# Patient Record
Sex: Female | Born: 1935 | Race: White | Hispanic: No | Marital: Married | Smoking: Former smoker
Health system: Southern US, Community
[De-identification: ages and names within clinical notes are randomized; demographics above are authoritative.]

## PROBLEM LIST (undated history)

## (undated) DIAGNOSIS — E7800 Pure hypercholesterolemia, unspecified: Secondary | ICD-10-CM

## (undated) DIAGNOSIS — I639 Cerebral infarction, unspecified: Secondary | ICD-10-CM

## (undated) DIAGNOSIS — I255 Ischemic cardiomyopathy: Secondary | ICD-10-CM

## (undated) DIAGNOSIS — K219 Gastro-esophageal reflux disease without esophagitis: Secondary | ICD-10-CM

## (undated) DIAGNOSIS — I2119 ST elevation (STEMI) myocardial infarction involving other coronary artery of inferior wall: Secondary | ICD-10-CM

## (undated) DIAGNOSIS — J449 Chronic obstructive pulmonary disease, unspecified: Secondary | ICD-10-CM

## (undated) DIAGNOSIS — I10 Essential (primary) hypertension: Secondary | ICD-10-CM

## (undated) DIAGNOSIS — C50919 Malignant neoplasm of unspecified site of unspecified female breast: Secondary | ICD-10-CM

## (undated) HISTORY — PX: BREAST BIOPSY: SHX20

## (undated) HISTORY — DX: Ischemic cardiomyopathy: I25.5

## (undated) HISTORY — DX: Cerebral infarction, unspecified: I63.9

## (undated) HISTORY — PX: CATARACT EXTRACTION: SUR2

## (undated) HISTORY — DX: ST elevation (STEMI) myocardial infarction involving other coronary artery of inferior wall: I21.19

## (undated) HISTORY — PX: BREAST EXCISIONAL BIOPSY: SUR124

## (undated) HISTORY — PX: BREAST MAMMOSITE: SHX5264

## (undated) HISTORY — PX: BRAIN SURGERY: SHX531

---

## 2004-07-02 ENCOUNTER — Ambulatory Visit: Payer: Self-pay | Admitting: Unknown Physician Specialty

## 2004-07-18 ENCOUNTER — Ambulatory Visit: Payer: Self-pay | Admitting: Otolaryngology

## 2004-08-22 ENCOUNTER — Ambulatory Visit: Payer: Self-pay | Admitting: Unknown Physician Specialty

## 2004-08-23 ENCOUNTER — Ambulatory Visit: Payer: Self-pay | Admitting: Unknown Physician Specialty

## 2004-10-03 ENCOUNTER — Ambulatory Visit: Payer: Self-pay

## 2004-10-21 ENCOUNTER — Ambulatory Visit: Payer: Self-pay | Admitting: Unknown Physician Specialty

## 2005-01-13 ENCOUNTER — Inpatient Hospital Stay: Payer: Self-pay | Admitting: Obstetrics and Gynecology

## 2005-02-06 ENCOUNTER — Ambulatory Visit: Payer: Self-pay | Admitting: Unknown Physician Specialty

## 2005-02-12 ENCOUNTER — Ambulatory Visit: Payer: Self-pay | Admitting: Unknown Physician Specialty

## 2005-04-04 ENCOUNTER — Ambulatory Visit: Payer: Self-pay | Admitting: Unknown Physician Specialty

## 2005-09-11 ENCOUNTER — Ambulatory Visit: Payer: Self-pay | Admitting: Unknown Physician Specialty

## 2005-11-18 ENCOUNTER — Ambulatory Visit: Payer: Self-pay | Admitting: Unknown Physician Specialty

## 2006-05-31 ENCOUNTER — Emergency Department: Payer: Self-pay | Admitting: Emergency Medicine

## 2006-06-24 ENCOUNTER — Ambulatory Visit: Payer: Self-pay | Admitting: Unknown Physician Specialty

## 2006-06-30 ENCOUNTER — Encounter: Payer: Self-pay | Admitting: Unknown Physician Specialty

## 2006-07-23 ENCOUNTER — Encounter: Payer: Self-pay | Admitting: Unknown Physician Specialty

## 2006-11-05 ENCOUNTER — Ambulatory Visit: Payer: Self-pay | Admitting: Unknown Physician Specialty

## 2006-11-17 ENCOUNTER — Ambulatory Visit: Payer: Self-pay | Admitting: Unknown Physician Specialty

## 2006-11-19 ENCOUNTER — Ambulatory Visit: Payer: Self-pay | Admitting: Unknown Physician Specialty

## 2006-11-25 ENCOUNTER — Ambulatory Visit: Payer: Self-pay | Admitting: Unknown Physician Specialty

## 2007-01-04 ENCOUNTER — Ambulatory Visit: Payer: Self-pay | Admitting: Unknown Physician Specialty

## 2007-11-25 ENCOUNTER — Ambulatory Visit: Payer: Self-pay | Admitting: Unknown Physician Specialty

## 2007-12-30 ENCOUNTER — Ambulatory Visit: Payer: Self-pay | Admitting: Unknown Physician Specialty

## 2008-01-04 ENCOUNTER — Other Ambulatory Visit: Payer: Self-pay

## 2008-01-04 ENCOUNTER — Observation Stay: Payer: Self-pay | Admitting: Specialist

## 2008-03-10 ENCOUNTER — Ambulatory Visit: Payer: Self-pay | Admitting: Unknown Physician Specialty

## 2008-08-15 ENCOUNTER — Ambulatory Visit: Payer: Self-pay | Admitting: Unknown Physician Specialty

## 2008-08-24 ENCOUNTER — Ambulatory Visit: Payer: Self-pay | Admitting: Unknown Physician Specialty

## 2008-12-05 ENCOUNTER — Encounter: Payer: Self-pay | Admitting: Unknown Physician Specialty

## 2008-12-21 ENCOUNTER — Encounter: Payer: Self-pay | Admitting: Unknown Physician Specialty

## 2009-01-20 ENCOUNTER — Encounter: Payer: Self-pay | Admitting: Unknown Physician Specialty

## 2009-02-12 ENCOUNTER — Ambulatory Visit: Payer: Self-pay | Admitting: Unknown Physician Specialty

## 2009-03-15 ENCOUNTER — Ambulatory Visit: Payer: Self-pay | Admitting: Unknown Physician Specialty

## 2009-11-08 ENCOUNTER — Ambulatory Visit: Payer: Self-pay | Admitting: Gastroenterology

## 2010-02-27 ENCOUNTER — Ambulatory Visit: Payer: Self-pay | Admitting: Unknown Physician Specialty

## 2010-03-03 ENCOUNTER — Ambulatory Visit: Payer: Self-pay | Admitting: Unknown Physician Specialty

## 2010-07-31 ENCOUNTER — Ambulatory Visit: Payer: Self-pay | Admitting: Unknown Physician Specialty

## 2010-08-14 ENCOUNTER — Encounter: Payer: Self-pay | Admitting: Unknown Physician Specialty

## 2010-08-22 ENCOUNTER — Encounter: Payer: Self-pay | Admitting: Unknown Physician Specialty

## 2010-09-22 ENCOUNTER — Encounter: Payer: Self-pay | Admitting: Unknown Physician Specialty

## 2011-02-13 ENCOUNTER — Ambulatory Visit: Payer: Self-pay | Admitting: Dermatology

## 2011-03-05 ENCOUNTER — Ambulatory Visit: Payer: Self-pay | Admitting: Unknown Physician Specialty

## 2011-07-23 ENCOUNTER — Ambulatory Visit: Payer: Self-pay | Admitting: Unknown Physician Specialty

## 2011-09-23 DIAGNOSIS — C50919 Malignant neoplasm of unspecified site of unspecified female breast: Secondary | ICD-10-CM

## 2011-09-23 HISTORY — DX: Malignant neoplasm of unspecified site of unspecified female breast: C50.919

## 2011-09-23 HISTORY — PX: BREAST LUMPECTOMY: SHX2

## 2012-03-24 ENCOUNTER — Ambulatory Visit: Payer: Self-pay | Admitting: Internal Medicine

## 2012-03-30 ENCOUNTER — Ambulatory Visit: Payer: Self-pay | Admitting: Internal Medicine

## 2012-04-06 ENCOUNTER — Ambulatory Visit: Payer: Self-pay | Admitting: Gastroenterology

## 2012-04-12 ENCOUNTER — Ambulatory Visit: Payer: Self-pay | Admitting: General Surgery

## 2012-04-19 ENCOUNTER — Ambulatory Visit: Payer: Self-pay | Admitting: General Surgery

## 2012-04-21 ENCOUNTER — Ambulatory Visit: Payer: Self-pay | Admitting: General Surgery

## 2012-04-23 LAB — PATHOLOGY REPORT

## 2012-04-30 ENCOUNTER — Ambulatory Visit: Payer: Self-pay | Admitting: Radiation Oncology

## 2012-05-03 LAB — PATHOLOGY REPORT

## 2012-05-23 ENCOUNTER — Ambulatory Visit: Payer: Self-pay | Admitting: Radiation Oncology

## 2012-06-17 ENCOUNTER — Ambulatory Visit: Payer: Self-pay | Admitting: General Surgery

## 2012-06-17 LAB — CBC WITH DIFFERENTIAL/PLATELET
Basophil %: 0.3 %
Eosinophil %: 2.7 %
HCT: 41.4 % (ref 35.0–47.0)
Lymphocyte #: 1.3 10*3/uL (ref 1.0–3.6)
MCHC: 32.9 g/dL (ref 32.0–36.0)
MCV: 93 fL (ref 80–100)
Monocyte #: 0.5 x10 3/mm (ref 0.2–0.9)
Neutrophil #: 3.3 10*3/uL (ref 1.4–6.5)
Neutrophil %: 63 %
Platelet: 358 10*3/uL (ref 150–440)
RBC: 4.45 10*6/uL (ref 3.80–5.20)
RDW: 13 % (ref 11.5–14.5)
WBC: 5.3 10*3/uL (ref 3.6–11.0)

## 2012-06-18 LAB — PATHOLOGY REPORT

## 2012-06-21 LAB — WOUND CULTURE

## 2012-07-01 ENCOUNTER — Ambulatory Visit: Payer: Self-pay | Admitting: Radiation Oncology

## 2012-07-15 ENCOUNTER — Inpatient Hospital Stay: Payer: Self-pay | Admitting: General Surgery

## 2012-07-15 LAB — CBC WITH DIFFERENTIAL/PLATELET
Basophil #: 0.1 10*3/uL (ref 0.0–0.1)
Basophil %: 0.5 %
Eosinophil %: 0.1 %
HCT: 40.9 % (ref 35.0–47.0)
HGB: 12.9 g/dL (ref 12.0–16.0)
Lymphocyte #: 0.6 10*3/uL — ABNORMAL LOW (ref 1.0–3.6)
MCV: 92 fL (ref 80–100)
Monocyte %: 7.4 %
Neutrophil #: 13.4 10*3/uL — ABNORMAL HIGH (ref 1.4–6.5)
Neutrophil %: 87.8 %
RBC: 4.46 10*6/uL (ref 3.80–5.20)
RDW: 13.3 % (ref 11.5–14.5)
WBC: 15.2 10*3/uL — ABNORMAL HIGH (ref 3.6–11.0)

## 2012-07-15 LAB — BASIC METABOLIC PANEL
Anion Gap: 7 (ref 7–16)
BUN: 11 mg/dL (ref 7–18)
Chloride: 101 mmol/L (ref 98–107)
Co2: 29 mmol/L (ref 21–32)
Creatinine: 0.76 mg/dL (ref 0.60–1.30)
Potassium: 4 mmol/L (ref 3.5–5.1)
Sodium: 137 mmol/L (ref 136–145)

## 2012-07-15 LAB — URINALYSIS, COMPLETE
Bacteria: NONE SEEN
Bilirubin,UR: NEGATIVE
Blood: NEGATIVE
Nitrite: NEGATIVE
Ph: 7 (ref 4.5–8.0)
Protein: NEGATIVE
Specific Gravity: 1.014 (ref 1.003–1.030)
Squamous Epithelial: 24

## 2012-07-16 LAB — URINE CULTURE

## 2012-07-17 LAB — CBC WITH DIFFERENTIAL/PLATELET
Basophil %: 0.7 %
HCT: 38.3 % (ref 35.0–47.0)
HGB: 12.9 g/dL (ref 12.0–16.0)
Lymphocyte #: 1.3 10*3/uL (ref 1.0–3.6)
MCH: 30.7 pg (ref 26.0–34.0)
MCHC: 33.6 g/dL (ref 32.0–36.0)
MCV: 91 fL (ref 80–100)
Monocyte #: 1 x10 3/mm — ABNORMAL HIGH (ref 0.2–0.9)
Neutrophil #: 7.7 10*3/uL — ABNORMAL HIGH (ref 1.4–6.5)
Neutrophil %: 75.1 %
RDW: 13.1 % (ref 11.5–14.5)

## 2012-07-17 LAB — WOUND AEROBIC CULTURE

## 2012-07-20 LAB — CULTURE, BLOOD (SINGLE)

## 2012-08-12 ENCOUNTER — Ambulatory Visit: Payer: Self-pay | Admitting: Radiation Oncology

## 2012-09-17 ENCOUNTER — Ambulatory Visit: Payer: Self-pay | Admitting: Radiation Oncology

## 2012-09-22 ENCOUNTER — Ambulatory Visit: Payer: Self-pay | Admitting: Radiation Oncology

## 2012-10-11 ENCOUNTER — Ambulatory Visit: Payer: Self-pay | Admitting: Gastroenterology

## 2012-11-16 ENCOUNTER — Ambulatory Visit: Payer: Self-pay | Admitting: Radiation Oncology

## 2012-11-20 ENCOUNTER — Ambulatory Visit: Payer: Self-pay | Admitting: Radiation Oncology

## 2012-11-25 ENCOUNTER — Ambulatory Visit: Payer: Self-pay | Admitting: Gastroenterology

## 2013-01-05 ENCOUNTER — Ambulatory Visit: Payer: Self-pay | Admitting: Unknown Physician Specialty

## 2013-01-05 LAB — CREATININE, SERUM
EGFR (African American): >60
EGFR (Non-African Amer.): >60

## 2013-02-17 ENCOUNTER — Ambulatory Visit: Payer: Self-pay | Admitting: Radiation Oncology

## 2013-02-20 ENCOUNTER — Ambulatory Visit: Payer: Self-pay | Admitting: Radiation Oncology

## 2013-06-07 ENCOUNTER — Ambulatory Visit: Payer: Self-pay | Admitting: Internal Medicine

## 2013-08-16 ENCOUNTER — Ambulatory Visit: Payer: Self-pay | Admitting: Radiation Oncology

## 2013-08-22 ENCOUNTER — Ambulatory Visit: Payer: Self-pay | Admitting: Radiation Oncology

## 2013-09-26 ENCOUNTER — Ambulatory Visit: Payer: Self-pay | Admitting: Gastroenterology

## 2013-09-27 LAB — PATHOLOGY REPORT

## 2014-06-20 ENCOUNTER — Ambulatory Visit: Payer: Self-pay | Admitting: Internal Medicine

## 2014-08-04 ENCOUNTER — Ambulatory Visit: Payer: Self-pay | Admitting: Radiation Oncology

## 2014-08-22 ENCOUNTER — Ambulatory Visit: Payer: Self-pay | Admitting: Radiation Oncology

## 2015-01-09 NOTE — Op Note (Signed)
PATIENT NAME:  Bianca Taylor, Bianca Taylor [facility] W MR#:  [id number] DATE OF BIRTH:  1930-01-31  DATE OF PROCEDURE:  06/17/2012  PREOPERATIVE DIAGNOSIS: Left breast infection.   POSTOPERATIVE DIAGNOSIS: Left breast infection.  OPERATIVE PROCEDURE: Debridement of MammoSite cavity, placement of drain.   SURGEON: Hervey Ard, MD   ANESTHESIA: General by LMA, Marcaine 0.5% with 1:200,000 units epinephrine 20 mL local infiltration.   ESTIMATED BLOOD LOSS: Minimal.   CLINICAL NOTE: This 81 year old woman had DCIS and was a candidate for MammoSite treatment. This was well tolerated, but one month post procedure she noticed erythema near the MammoSite balloon exit site. Culture showed evidence of Pseudomonas. This failed to improve with local debridement in the office or appropriate antibiotics by mouth. She was brought to the operating room for exam under anesthesia and further debridement.   DESCRIPTION OF PROCEDURE: The patient received a dose of Unasyn prior to surgery. The breast was prepped with Betadine solution and draped. The MammoSite exit site was sharply debrided. A hysteroscope was advanced into the 2 cm cavity of the MammoSite balloon and filamentous material without gross purulent material was noted. This was sharply debrided with a curette. Reexamination after debridement showed the area had been cleared. The red rubber catheter used for irrigation during the endoscopic examination of the cavity was transfixed with a 3-0 nylon suture and this was passed to the deep edge of the abscess cavity to the upper inner         quadrant of the breast and trimmed 1 cm outside the edge of the skin for long-standing drain. A fluff gauze followed by Kerlix followed by an Ace wrap was applied.   The patient tolerated the procedure well and was taken to the recovery room stable condition. ____________________________ Robert Bellow, MD jwb:slb D: 06/17/2012 21:39:07 ET T: 06/18/2012 10:34:39  ET JOB#: [id number]  cc: Robert Bellow, MD, <Dictator> [name], MD Armstead Peaks, MD Esterlene Atiyeh Amedeo Kinsman MD ELECTRONICALLY SIGNED 06/18/2012 10:46

## 2015-01-09 NOTE — Op Note (Signed)
PATIENT NAME:  Bianca Taylor, Bianca Taylor [facility] W MR#:  [id number] DATE OF BIRTH:  [birth year]  DATE OF PROCEDURE:  04/21/2012  PREOPERATIVE DIAGNOSIS: DCIS of the left breast.   POSTOPERATIVE DIAGNOSIS: DCIS of the left breast.   OPERATIVE PROCEDURE: Wide local excision, mastoplasty.   OPERATING SURGEON: Robert Bellow, MD   ANESTHESIA: General by LMA under Dr. Marcello Moores, Marcaine 0.5% with 1:200,000 units of epinephrine, 30 mL local infiltration.   ESTIMATED BLOOD LOSS: Less than 10 mL.   CLINICAL NOTE: This 81 year old woman was recently diagnosed with DCIS on stereotactic biopsy. Given her options for management, she desired breast conservation.   OPERATIVE NOTE: With the patient under adequate general anesthesia, the area was prepped with ChloraPrep and draped. Ultrasound was used to confirm the location of the previous biopsy cavity. This was radially based at the 1 o'clock position. This corresponded to one of her multiple breast scars from previous cyst excisions in the distant past. A radial incision was then chosen and extended from the edge of the areola at the 1 o'clock position for approximately 6 cm. The mass was below the level of the adipose tissue and the skin and subcutaneous tissue was divided sharply with hemostasis achieved by electrocautery. The adipose tissue was elevated off the area surrounding the biopsy cavity and this encompassed a mass approximately 3 x 3 x 8 cm in diameter. The specimen was orientated, specimen radiograph obtained, and delivered to pathology for routine sectioning. Of note, the fascia of the pectoralis fascia major muscle was taken with the specimen as the deep margin.   Mastoplasty was completed by elevating the breast off the underlying pectoralis fascia to the 10 o'clock position superiorly, medially and to the 5 o'clock position inferolaterally. This tissue was then approximated with interrupted 2-0 Vicryl figure-of-eight sutures. The deep layer of the adipose tissue  was approximated in a similar fashion as well as either end of the biopsy cavity. The skin was then closed with a running 4-0 Vicryl subcuticular suture. This allowed adequate placement for MammoSite balloon if indicated. Marcaine was infiltrated at the beginning and end of the procedure. Benzoin and Steri-Strips followed by a Telfa dressing, fluff gauze, Kerlix, and Ace wrap was then applied.   The patient tolerated the procedure well and was taken to the recovery room in stable condition.    ____________________________ Robert Bellow, MD jwb:drc D: 04/21/2012 17:50:41 ET T: 04/21/2012 17:55:20 ET JOB#: [id number]  cc: Robert Bellow, MD, <Dictator> [name], MD JEFFREY Amedeo Kinsman MD ELECTRONICALLY SIGNED 04/22/2012 8:49

## 2015-01-09 NOTE — Consult Note (Signed)
Reason for Visit: This 81 year old Female patient presents to the clinic for initial evaluation of  Breast cancer .   Referred by Dr. Hervey Ard.  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 She underwent needle localization which was positive for ductal carcinoma in situ. She is status post a wide local excision for 0.2 cm of ductal carcinoma in situ overall grade 2. Margins were clear at 6 mm. Tumor was ER/PR positive. She has done well postoperatively still has some slight ecchymosis surrounding the scar site. She specifically denies breast tenderness cough or bone pain. I been asked to evaluate the patient for adjuvant radiation therapy.  Past Hx:    GERD - Esophageal Reflux:    Kidney Stones:    Breast Cancer:    COPD:    Glaucoma:    Hypercholesterolemia:    HTN:    Cataract Extraction:    Hysterectomy - Partial:    Brain Surgery:   Past, Family and Social History:   Past Medical History positive    Cardiovascular hyperlipidemia; hypertension    Respiratory COPD    Gastrointestinal GERD    Genitourinary kidney stones    Neurological/Psychiatric History of benign meningioma surgically removed    Past Surgical History Hysterectomy, history of brain surgery for benign meningioma    Past Medical History Comments Glaucoma    Family History noncontributory    Social History noncontributory    Additional Past Medical and  Surgical History Seen accompanied by nurse navigator and her husband today   Allergies:   Codeine: Other  Home Meds:  Home Medications: Medication Instructions Status  Benicar 40 mg oral tablet 1 tab(s) orally once a day (in the morning) Active  pravastatin 40 mg oral tablet 1 tab(s) orally once a day (at bedtime) Active  sucralfate 1 g oral tablet 1 tab(s) orally once a day (in the morning) Active  Protonix 40 mg oral delayed release tablet 1 tab(s) orally once a day (in the morning) Active  Ambien 5 mg oral tablet 0.5 tab(s) orally once a day (at bedtime) Active  acetaminophen-butalbital-caff  325 mg-50-40  mg oral tablet 1 tab(s) orally every 4 hours, As Needed Active  probiotic colon support 1 tab(s) orally once a day Active  Senior Multivitamin oral tablet 1 tab(s) orally once a day Active  Calcium 600+D 600 mg-200 intl units oral tablet 1 tab(s) orally 2 times a day Active  Co Q-10 100 mg oral capsule 1 cap(s) orally once a day Active  CeraVe topical lotion Apply topically to affected area once a day Active  Gaviscon 80 mg-14.2 mg oral tablet, chewable 2 tab(s) orally 4 times a day (after meals and at bedtime), As Needed Active   Review of Systems:   General negative    Performance Status (ECOG) 0    Skin negative    Breast see HPI    Ophthalmologic see HPI    ENMT negative    Respiratory and Thorax negative  Cardiovascular negative    Gastrointestinal negative    Genitourinary negative    Musculoskeletal negative    Neurological negative    Psychiatric negative    Hematology/Lymphatics negative    Endocrine negative    Allergic/Immunologic negative    Review of Systems   Patient denies any weight loss, fatigue, weakness, fever, chills or night sweats. Patient denies any loss of vision, blurred vision. Patient denies any ringing  of the ears or hearing loss. No irregular heartbeat. Patient denies heart murmur or history of fainting. Patient denies any  chest pain or pain radiating to her upper extremities. Patient denies any shortness of breath, difficulty breathing at night, cough or hemoptysis. Patient denies any swelling in the lower legs. Patient denies any nausea vomiting, vomiting of blood, or coffee ground material in the vomitus. Patient denies any stomach pain. Patient states has had normal bowel movements no significant constipation or diarrhea. Patient denies any dysuria, hematuria or significant nocturia. Patient denies any problems walking, swelling in the joints or loss of balance. Patient denies any skin changes, loss of hair or loss of weight. Patient denies any excessive worrying or anxiety or significant depression. Patient denies any problems with insomnia. Patient denies excessive thirst, polyuria, polydipsia. Patient denies any swollen glands, patient denies easy bruising or easy bleeding. Patient denies any recent infections, allergies or URI. Patient "s visual fields have not changed significantly in recent time.  Nursing Notes:  Nursing Vital Signs and Chemo Nursing Nursing Notes: *CC Vital Signs Flowsheet:   09-Aug-13 10:38   Temp Temperature 97.7   Pulse Pulse 84   Respirations Respirations 20   SBP SBP 146   DBP DBP 89   Pain Scale (0-10)  1   Current Weight (kg) (kg) 59.3   Height (cm) centimeters 156   BSA (m2) 1.5   Physical Exam:  General/Skin/HEENT:   General normal    Skin normal    Eyes normal    ENMT normal    Head and Neck normal    Additional PE Well-developed well-nourished elderly female in NAD. She status post wide local excision of the left breast with some ecchymosis present surrounding the scar site. She has multiple other incisional biopsy sites of the breast which are well-healed. No dominant mass or nodularity is noted in either breast into position examined. No axillary or supraclavicular adenopathy is appreciated. Lungs are clear to A&P cardiac examination shows regular rate and rhythm.    Breasts/Resp/CV/GI/GU:   Respiratory and Thorax normal    Cardiovascular normal    Gastrointestinal normal    Genitourinary normal   MS/Neuro/Psych/Lymph:   Musculoskeletal normal    Neurological normal    Lymphatics normal   Assessment and Plan:  Impression:   ductal carcinoma in situ of the left breast teslas wide local excision ER/PR positive and 81 year old female.  Plan:   I discussed the case with Dr. Tollie Pizza personally. Based on the early stage disease her age and small tumor size believe she would be an excellent candidate if possible for accelerated partial breast irradiation. Dr. Tollie Pizza will attempt to place a dummy catheter to see if we have significant spacing from skin and chest wall. If she is able to have MammoSite catheter placed adequately we will proceed with accelerated partial breast irradiation to deliver 3400 cGy in 10 fractions at 340 cGy twice a day prescribed 1 cm from catheter surface. If catheter is unable to be placed will receive with whole breast radiation I discussed both  treatment options in detail with the patient and her husband. Risks and benefits of treatment including skin reaction, fatigue, permanent lumpectomy site thickening were all discussed in detail with the patient. We will try to coordinate with Dr. Hervey Ard office to place a catheter early next week. will also be a candidate for aromatase inhibitor most likely tamoxifen after completion of radiation therapy based on her ductal carcinoma in situ ER/PR positive status.  I would like to take this opportunity to thank you for allowing me to continue to participate in this patient's care.  CC Referral:   cc: Dr. Hervey Ard, Dr. Ramonita Lab   Electronic Signatures: Baruch Gouty Roda Shutters (MD)  (Signed 09-Aug-13 11:32)  Authored: HPI, Diagnosis, Past Hx, PFSH, Allergies, Home Meds, ROS, Nursing Notes, Physical Exam, Encounter Assessment and Plan, CC Referring Physician   Last  Updated: 09-Aug-13 11:32 by Armstead Peaks (MD)

## 2015-07-11 ENCOUNTER — Other Ambulatory Visit: Payer: Self-pay | Admitting: Internal Medicine

## 2015-07-11 DIAGNOSIS — Z853 Personal history of malignant neoplasm of breast: Secondary | ICD-10-CM

## 2015-07-18 ENCOUNTER — Other Ambulatory Visit: Payer: Self-pay | Admitting: Internal Medicine

## 2015-07-18 DIAGNOSIS — Z853 Personal history of malignant neoplasm of breast: Secondary | ICD-10-CM

## 2015-08-06 ENCOUNTER — Encounter: Payer: Self-pay | Admitting: Radiation Oncology

## 2015-08-06 ENCOUNTER — Ambulatory Visit
Admission: RE | Admit: 2015-08-06 | Discharge: 2015-08-06 | Disposition: A | Discharge status: Home / Self Care | Payer: Medicare Other | Source: Ambulatory Visit | Attending: Radiation Oncology | Admitting: Radiation Oncology

## 2015-08-06 VITALS — BP 139/88 | HR 87 | Temp 97.1°F | Resp 20 | Ht 62.0 in | Wt 130.4 lb

## 2015-08-06 DIAGNOSIS — C50912 Malignant neoplasm of unspecified site of left female breast: Secondary | ICD-10-CM

## 2015-08-06 HISTORY — DX: Pure hypercholesterolemia, unspecified: E78.00

## 2015-08-06 HISTORY — DX: Gastro-esophageal reflux disease without esophagitis: K21.9

## 2015-08-06 HISTORY — DX: Chronic obstructive pulmonary disease, unspecified: J44.9

## 2015-08-06 HISTORY — DX: Essential (primary) hypertension: I10

## 2015-08-06 HISTORY — DX: Malignant neoplasm of unspecified site of unspecified female breast: C50.919

## 2015-08-06 NOTE — Progress Notes (Signed)
Radiation Oncology Follow up Note  Name: Bianca Taylor   Date:   08/06/2015 MRN:  [id number] DOB: May 05, 1930    This 81 y.o. female presents to the clinic today for follow-up for breast cancer stage 0 ductal carcinoma in situ now out 3 years..  REFERRING PROVIDER: No ref. provider found  HPI: Patient is a pleasant 81 year old female now out 3 years having completed accelerated partial breast radiation to her left breast for ductal carcinoma in situ ER/PR positive. Seen today in routine follow-up she is doing well. Follow-up mammograms have been fine. She's currently on tamoxifen tolerating that well without side effect. She specifically denies breast tenderness cough or bone pain..  COMPLICATIONS OF TREATMENT: none  FOLLOW UP COMPLIANCE: keeps appointments   PHYSICAL EXAM:  BP 139/88 mmHg  Pulse 87  Temp(Src) 97.1 F (36.2 C)  Resp 20  Ht 5\' 2"  (1.575 m)  Wt 130 lb 6.4 oz (59.15 kg)  BMI 23.84 kg/m2 Lungs are clear to A&P cardiac examination essentially unremarkable with regular rate and rhythm. No dominant mass or nodularity is noted in either breast in 2 positions examined. Incision is well-healed. No axillary or supraclavicular adenopathy is appreciated. Cosmetic result is excellent. She has a slight area of telangiectatic changes of the skin over the old MammoSite balloon although the cosmetic result is still good to excellent. Well-developed well-nourished patient in NAD. HEENT reveals PERLA, EOMI, discs not visualized.  Oral cavity is clear. No oral mucosal lesions are identified. Neck is clear without evidence of cervical or supraclavicular adenopathy. Lungs are clear to A&P. Cardiac examination is essentially unremarkable with regular rate and rhythm without murmur rub or thrill. Abdomen is benign with no organomegaly or masses noted. Motor sensory and DTR levels are equal and symmetric in the upper and lower extremities. Cranial nerves II through XII are grossly intact.  Proprioception is intact. No peripheral adenopathy or edema is identified. No motor or sensory levels are noted. Crude visual fields are within normal range.  RADIOLOGY RESULTS: Mammograms are reviewed compatible with the above-stated findings  PLAN: At the present time she is doing well with no evidence of disease 3 years out. I'll see her back in one more year for follow-up and then discontinue follow-up care. She continues close follow-up care with surgeon. I'm please were overall progress.  I would like to take this opportunity for allowing me to participate in the care of your patient.Armstead Peaks., MD

## 2015-08-08 ENCOUNTER — Ambulatory Visit
Admission: RE | Admit: 2015-08-08 | Discharge: 2015-08-08 | Disposition: A | Discharge status: Home / Self Care | Payer: Medicare Other | Source: Ambulatory Visit | Attending: Internal Medicine | Admitting: Internal Medicine

## 2015-08-08 DIAGNOSIS — Z853 Personal history of malignant neoplasm of breast: Secondary | ICD-10-CM | POA: Diagnosis present

## 2016-07-17 ENCOUNTER — Other Ambulatory Visit: Payer: Self-pay | Admitting: Internal Medicine

## 2016-07-17 DIAGNOSIS — C50912 Malignant neoplasm of unspecified site of left female breast: Secondary | ICD-10-CM

## 2016-08-04 ENCOUNTER — Encounter: Payer: Self-pay | Admitting: Radiation Oncology

## 2016-08-04 ENCOUNTER — Ambulatory Visit
Admission: RE | Admit: 2016-08-04 | Discharge: 2016-08-04 | Disposition: A | Discharge status: Home / Self Care | Payer: Medicare Other | Source: Ambulatory Visit | Attending: Radiation Oncology | Admitting: Radiation Oncology

## 2016-08-04 VITALS — BP 150/93 | HR 98 | Temp 97.1°F | Resp 18 | Wt 128.6 lb

## 2016-08-04 DIAGNOSIS — Z79810 Long term (current) use of selective estrogen receptor modulators (SERMs): Secondary | ICD-10-CM | POA: Insufficient documentation

## 2016-08-04 DIAGNOSIS — Z170 Estrogen receptor positive status [ER+]: Secondary | ICD-10-CM | POA: Insufficient documentation

## 2016-08-04 DIAGNOSIS — D0512 Intraductal carcinoma in situ of left breast: Secondary | ICD-10-CM | POA: Insufficient documentation

## 2016-08-04 NOTE — Progress Notes (Signed)
Radiation Oncology Follow up Note  Name: Bianca Taylor   Date:   08/04/2016 MRN:  [id number] DOB: [birth year]    This 82 y.o. female presents to the clinic today for for your follow-up status post accelerated partial breast irradiation to her left breast for ER/PR positive ductal carcinoma in situ.  REFERRING PROVIDER: [name], MD  HPI: Patient is a 82 year old female now seen out 4 years having completed accelerated partial breast radiation to her left breast for ER/PR positive ductal carcinoma in situ. She seen today in routine follow-up and continues to do well. She's currently on tamoxifen tolerating that well without side effect. She has follow-up mammogram scheduled for next week prior films have been fine showing no evidence of disease.. She specifically denies breast tenderness cough or bone pain.  COMPLICATIONS OF TREATMENT: none  FOLLOW UP COMPLIANCE: keeps appointments   PHYSICAL EXAM:  BP (!) 150/93   Pulse 98   Temp 97.1 F (36.2 C)   Resp 18   Wt 128 lb 10.2 oz (58.3 kg)   BMI 23.53 kg/m  Lungs are clear to A&P cardiac examination essentially unremarkable with regular rate and rhythm. No dominant mass or nodularity is noted in either breast in 2 positions examined. Incision is well-healed. No axillary or supraclavicular adenopathy is appreciated. Cosmetic result is excellent. Well-developed well-nourished patient in NAD. HEENT reveals PERLA, EOMI, discs not visualized.  Oral cavity is clear. No oral mucosal lesions are identified. Neck is clear without evidence of cervical or supraclavicular adenopathy. Lungs are clear to A&P. Cardiac examination is essentially unremarkable with regular rate and rhythm without murmur rub or thrill. Abdomen is benign with no organomegaly or masses noted. Motor sensory and DTR levels are equal and symmetric in the upper and lower extremities. Cranial nerves II through XII are grossly intact. Proprioception is intact. No peripheral  adenopathy or edema is identified. No motor or sensory levels are noted. Crude visual fields are within normal range.  RADIOLOGY RESULTS: Previous mammograms are reviewed  PLAN: At the present time she is doing well with no evidence of disease. I'm please were overall progress. I've asked to see her back in 1 year for follow-up. She continues on tamoxifen without side effect. She knows to call sooner with any concerns.  I would like to take this opportunity to thank you for allowing me to participate in the care of your patient.Armstead Peaks., MD

## 2016-08-12 ENCOUNTER — Ambulatory Visit
Admission: RE | Admit: 2016-08-12 | Discharge: 2016-08-12 | Disposition: A | Discharge status: Home / Self Care | Payer: Medicare Other | Source: Ambulatory Visit | Attending: Internal Medicine | Admitting: Internal Medicine

## 2016-08-12 ENCOUNTER — Encounter: Payer: Self-pay | Admitting: Radiology

## 2016-08-12 DIAGNOSIS — C50912 Malignant neoplasm of unspecified site of left female breast: Secondary | ICD-10-CM | POA: Diagnosis present

## 2017-01-04 ENCOUNTER — Emergency Department
Admission: EM | Admit: 2017-01-04 | Discharge: 2017-01-04 | Disposition: A | Discharge status: Home / Self Care | Payer: Medicare Other | Attending: Emergency Medicine | Admitting: Emergency Medicine

## 2017-01-04 ENCOUNTER — Emergency Department: Payer: Medicare Other

## 2017-01-04 DIAGNOSIS — Z853 Personal history of malignant neoplasm of breast: Secondary | ICD-10-CM | POA: Insufficient documentation

## 2017-01-04 DIAGNOSIS — Y939 Activity, unspecified: Secondary | ICD-10-CM | POA: Insufficient documentation

## 2017-01-04 DIAGNOSIS — S52615A Nondisplaced fracture of left ulna styloid process, initial encounter for closed fracture: Secondary | ICD-10-CM | POA: Diagnosis not present

## 2017-01-04 DIAGNOSIS — W07XXXA Fall from chair, initial encounter: Secondary | ICD-10-CM | POA: Insufficient documentation

## 2017-01-04 DIAGNOSIS — I10 Essential (primary) hypertension: Secondary | ICD-10-CM | POA: Diagnosis not present

## 2017-01-04 DIAGNOSIS — J449 Chronic obstructive pulmonary disease, unspecified: Secondary | ICD-10-CM | POA: Insufficient documentation

## 2017-01-04 DIAGNOSIS — Y999 Unspecified external cause status: Secondary | ICD-10-CM | POA: Insufficient documentation

## 2017-01-04 DIAGNOSIS — S62328A Displaced fracture of shaft of other metacarpal bone, initial encounter for closed fracture: Secondary | ICD-10-CM

## 2017-01-04 DIAGNOSIS — Z7982 Long term (current) use of aspirin: Secondary | ICD-10-CM | POA: Diagnosis not present

## 2017-01-04 DIAGNOSIS — Y929 Unspecified place or not applicable: Secondary | ICD-10-CM | POA: Insufficient documentation

## 2017-01-04 DIAGNOSIS — Z79899 Other long term (current) drug therapy: Secondary | ICD-10-CM | POA: Insufficient documentation

## 2017-01-04 DIAGNOSIS — S6992XA Unspecified injury of left wrist, hand and finger(s), initial encounter: Secondary | ICD-10-CM | POA: Diagnosis present

## 2017-01-04 DIAGNOSIS — W19XXXA Unspecified fall, initial encounter: Secondary | ICD-10-CM

## 2017-01-04 DIAGNOSIS — S62323A Displaced fracture of shaft of third metacarpal bone, left hand, initial encounter for closed fracture: Secondary | ICD-10-CM | POA: Diagnosis not present

## 2017-01-04 DIAGNOSIS — S52325A Nondisplaced transverse fracture of shaft of left radius, initial encounter for closed fracture: Secondary | ICD-10-CM | POA: Diagnosis not present

## 2017-01-04 MED ORDER — TRAMADOL HCL 50 MG PO TABS: 50.0000 mg | ORAL_TABLET | Freq: Four times a day (QID) | ORAL | 0 refills | Status: DC | PRN

## 2017-01-04 MED ORDER — TRAMADOL HCL 50 MG PO TABS
50.0000 mg | ORAL_TABLET | Freq: Once | ORAL | Status: AC
  Administered 2017-01-04: 50 mg via ORAL
  Filled 2017-01-04: qty 1

## 2017-01-04 NOTE — ED Triage Notes (Signed)
Pt presents POV c/o left wrist pain s/p fall yesterday. Bruising noted to left wrist/hand.

## 2017-01-04 NOTE — ED Notes (Signed)
See triage note.states she fell while trying to get up out of chair  Hit her left hand on tray   Swelling and bruising noted

## 2017-01-04 NOTE — Discharge Instructions (Signed)
Ice and elevate to reduce swelling. Wear splint until seen by the orthopedist. You may wear the sling for support but did not wear it while sleeping. Tramadol as needed for pain. Be aware that this medication could cause drowsiness and increase her risk for falling. Call Dr. Ammie Ferrier office tomorrow for an appointment.

## 2017-01-04 NOTE — ED Provider Notes (Signed)
[facility] Provider Note  ____________________________________________   First MD Initiated Contact with Patient 01/04/17 1045     (approximate)  I have reviewed the triage vital signs and the nursing notes.   HISTORY  Chief Complaint Hand Pain   HPI Bianca Taylor is a 83 y.o. female is here with complaint of left hand and wrist pain. Patient states she fell yesterday while getting out of her chair. She states that she did not hit her head or lose consciousness. She states that she is aware that she was calling for her husband who was doing some yard work outside. She has continued to have pain in her left hand and wrist. Her husband is here with her today. She rates her pain as an 8 out of 10.   Past Medical History:  Diagnosis Date  . Breast cancer ([place])    left 2013  . COPD (chronic obstructive pulmonary disease) ([place])   . GERD (gastroesophageal reflux disease)   . Hypercholesterolemia   . Hypertension     There are no active problems to display for this patient.   Past Surgical History:  Procedure Laterality Date  . BRAIN SURGERY    . BREAST BIOPSY Left    positive 03/2012  . BREAST BIOPSY Bilateral    negative 2000  . BREAST EXCISIONAL BIOPSY Left    positive 04/2012  . BREAST MAMMOSITE Left   . CATARACT EXTRACTION      Prior to Admission medications   Medication Sig Start Date End Date Taking? Authorizing Provider  albuterol (PROAIR HFA) 108 (90 BASE) MCG/ACT inhaler Inhale into the lungs.    Historical Provider, MD  aspirin EC 81 MG tablet Take by mouth.    Historical Provider, MD  butalbital-acetaminophen-caffeine (FIORICET, ESGIC) [phone] MG tablet Take by mouth. 06/15/15   Historical Provider, MD  Calcium Carbonate-Vitamin D 600-400 MG-UNIT tablet Take by mouth.    Historical Provider, MD  Clobetasol Propionate (CLOBETASOL 17 PROPIONATE) POWD Use. Ointment    Historical Provider, MD  cyclobenzaprine  (FLEXERIL) 10 MG tablet ONE-HALF TO ONE TABLET TWICE DAILY 04/03/15   Historical Provider, MD  Multiple Vitamin (MULTI-VITAMINS) TABS Take by mouth.    Historical Provider, MD  pantoprazole (PROTONIX) 40 MG tablet TAKE ONE TABLET BY MOUTH EVERY DAY ONE HOUR BEFORE A MEAL 02/16/15   Historical Provider, MD  pravastatin (PRAVACHOL) 40 MG tablet TAKE 1 TABLET EVERY DAY 01/29/15   Historical Provider, MD  tamoxifen (NOLVADEX) 20 MG tablet Take by mouth. 10/16/14   Historical Provider, MD  telmisartan (MICARDIS) 80 MG tablet Take by mouth. 06/13/15 06/12/16  Historical Provider, MD  tiotropium (SPIRIVA) 18 MCG inhalation capsule Place into inhaler and inhale.    Historical Provider, MD  traMADol (ULTRAM) 50 MG tablet Take 1 tablet (50 mg total) by mouth every 6 (six) hours as needed for moderate pain. 01/04/17   [name], PA-C  zolpidem (AMBIEN) 10 MG tablet TAKE 1/2 TABLET AT BEDTIME 03/27/15   Historical Provider, MD    Allergies Codeine; Fluocinolone; Moxifloxacin; Pravastatin; Risedronate; Tetracyclines & related; and Niacin  Family History  Problem Relation Age of Onset  . Breast cancer Sister 12    Social History Social History  Substance Use Topics  . Smoking status: Never Smoker  . Smokeless tobacco: Never Used  . Alcohol use No    Review of Systems Constitutional: No fever/chills Eyes: No visual changes.  Glasses did break. ENT: No trauma Cardiovascular: Denies  chest pain. Respiratory: Denies shortness of breath. Musculoskeletal: Positive for left hand and wrist pain. Skin: Negative for rash. Neurological: Negative for headaches, focal weakness or numbness.  10-point ROS otherwise negative.  ____________________________________________   PHYSICAL EXAM:  VITAL SIGNS: ED Triage Vitals  Enc Vitals Group     BP 01/04/17 1021 140/89     Pulse Rate 01/04/17 1021 (!) 102     Resp 01/04/17 1021 14     Temp 01/04/17 1021 98 F (36.7 C)     Temp Source 01/04/17 1021 Oral       SpO2 01/04/17 1021 95 %     Weight 01/04/17 1022 125 lb (56.7 kg)     Height 01/04/17 1022 5\' 2"  (1.575 m)     Head Circumference --      Peak Flow --      Pain Score 01/04/17 1021 8     Pain Loc --      Pain Edu? --      Excl. in [place]? --     Constitutional: Alert and oriented. Well appearing and in no acute distress. Eyes: Conjunctivae are normal. PERRL. EOMI. Head: Atraumatic. Nose: No congestion/rhinnorhea. Neck: No stridor.   Cardiovascular: Normal rate, regular rhythm. Grossly normal heart sounds.  Good peripheral circulation. Respiratory: Normal respiratory effort.  No retractions. Lungs CTAB. Musculoskeletal: On examination of the hand and wrist there is no gross deformity noted however there is moderate soft tissue swelling present. There is ecchymosis present on the dorsal aspect of the left hand between the second and fourth digits. Motor sensory function intact. Capillary refill less than 3 seconds. No bleeding or open skin. Range of motion is restricted secondary to discomfort. There is some tenderness on palpation of the distal radius and on the and soft tissue swelling present. Neurologic:  Normal speech and language. No gross focal neurologic deficits are appreciated.  Skin:  Skin is warm, dry and intact. No rash noted. Psychiatric: Mood and affect are normal. Speech and behavior are normal.  ____________________________________________   LABS (all labs ordered are listed, but only abnormal results are displayed)  Labs Reviewed - No data to display  RADIOLOGY Left wrist and hand x-ray per radiologist: IMPRESSION:  1. Nondisplaced transverse fracture of the distal radius.  2. Minimally displaced ulnar styloid fracture.  3. Minimally displaced oblique fracture in the third metacarpal.  4. Extensive soft tissue swelling over the dorsum of the hand and  wrist.   I, [name], personally viewed and evaluated these images (plain radiographs) as part of my  medical decision making, as well as reviewing the written report by the radiologist.  ____________________________________________   PROCEDURES  Procedure(s) performed: None  Procedures  Critical Care performed: No  ____________________________________________   INITIAL IMPRESSION / ASSESSMENT AND PLAN / ED COURSE  Pertinent labs & imaging results that were available during my care of the patient were reviewed by me and considered in my medical decision making (see chart for details).  Patient was placed in an OCL volar splint and a sling for extra support. Patient was given a tramadol while in the emergency room. She is also discharged with prescription for the same. Patient is aware that this medication could cause drowsiness increase her risk for falling. She is to call Monday to make an appointment with Dr. Sabra Heck. Until then she is to continue ice and elevation to decrease swelling and help with pain.      ____________________________________________   FINAL CLINICAL IMPRESSION(S) /  ED DIAGNOSES  Final diagnoses:  Closed displaced fracture of shaft of third metacarpal bone, unspecified laterality, initial encounter  Closed nondisplaced fracture of styloid process of left ulna, initial encounter  Closed nondisplaced transverse fracture of shaft of left radius, initial encounter  Fall, initial encounter      NEW MEDICATIONS STARTED DURING THIS VISIT:  Discharge Medication List as of 01/04/2017 12:06 PM    START taking these medications   Details  traMADol (ULTRAM) 50 MG tablet Take 1 tablet (50 mg total) by mouth every 6 (six) hours as needed for moderate pain., Starting Sun 01/04/2017, Print         Note:  This document was prepared using Dragon voice recognition software and may include unintentional dictation errors.    [name], PA-C 01/04/17 [place], MD 01/04/17 [phone]

## 2017-02-18 ENCOUNTER — Emergency Department: Payer: Medicare Other

## 2017-02-18 ENCOUNTER — Inpatient Hospital Stay: Payer: Medicare Other

## 2017-02-18 ENCOUNTER — Inpatient Hospital Stay
Admission: EM | Admit: 2017-02-18 | Discharge: 2017-02-20 | DRG: 064 | Disposition: A | Discharge status: Home Health | Payer: Medicare Other | Attending: Internal Medicine | Admitting: Internal Medicine

## 2017-02-18 ENCOUNTER — Encounter: Payer: Self-pay | Admitting: Emergency Medicine

## 2017-02-18 DIAGNOSIS — R471 Dysarthria and anarthria: Secondary | ICD-10-CM | POA: Diagnosis present

## 2017-02-18 DIAGNOSIS — R4701 Aphasia: Secondary | ICD-10-CM | POA: Diagnosis present

## 2017-02-18 DIAGNOSIS — I639 Cerebral infarction, unspecified: Principal | ICD-10-CM | POA: Diagnosis present

## 2017-02-18 DIAGNOSIS — Z7982 Long term (current) use of aspirin: Secondary | ICD-10-CM

## 2017-02-18 DIAGNOSIS — Z66 Do not resuscitate: Secondary | ICD-10-CM | POA: Diagnosis present

## 2017-02-18 DIAGNOSIS — I10 Essential (primary) hypertension: Secondary | ICD-10-CM | POA: Diagnosis present

## 2017-02-18 DIAGNOSIS — Z803 Family history of malignant neoplasm of breast: Secondary | ICD-10-CM

## 2017-02-18 DIAGNOSIS — Z853 Personal history of malignant neoplasm of breast: Secondary | ICD-10-CM

## 2017-02-18 DIAGNOSIS — I213 ST elevation (STEMI) myocardial infarction of unspecified site: Secondary | ICD-10-CM | POA: Diagnosis not present

## 2017-02-18 DIAGNOSIS — R4781 Slurred speech: Secondary | ICD-10-CM

## 2017-02-18 DIAGNOSIS — K219 Gastro-esophageal reflux disease without esophagitis: Secondary | ICD-10-CM | POA: Diagnosis present

## 2017-02-18 DIAGNOSIS — E785 Hyperlipidemia, unspecified: Secondary | ICD-10-CM | POA: Diagnosis present

## 2017-02-18 DIAGNOSIS — R29810 Facial weakness: Secondary | ICD-10-CM | POA: Diagnosis present

## 2017-02-18 DIAGNOSIS — J449 Chronic obstructive pulmonary disease, unspecified: Secondary | ICD-10-CM | POA: Diagnosis present

## 2017-02-18 DIAGNOSIS — E7800 Pure hypercholesterolemia, unspecified: Secondary | ICD-10-CM | POA: Diagnosis present

## 2017-02-18 DIAGNOSIS — Z79899 Other long term (current) drug therapy: Secondary | ICD-10-CM | POA: Diagnosis not present

## 2017-02-18 DIAGNOSIS — I2119 ST elevation (STEMI) myocardial infarction involving other coronary artery of inferior wall: Secondary | ICD-10-CM | POA: Diagnosis present

## 2017-02-18 LAB — COMPREHENSIVE METABOLIC PANEL
ALBUMIN: 3.9 g/dL (ref 3.5–5.0)
ALK PHOS: 79 U/L (ref 38–126)
ALT: 23 U/L (ref 14–54)
AST: 87 U/L — AB (ref 15–41)
Anion gap: 7 (ref 5–15)
BILIRUBIN TOTAL: 0.6 mg/dL (ref 0.3–1.2)
BUN: 13 mg/dL (ref 6–20)
CALCIUM: 9.2 mg/dL (ref 8.9–10.3)
CO2: 28 mmol/L (ref 22–32)
Chloride: 101 mmol/L (ref 101–111)
Creatinine, Ser: 0.82 mg/dL (ref 0.44–1.00)
GFR calc Af Amer: >60 mL/min (ref >60)
GFR calc non Af Amer: >60 mL/min (ref >60)
GLUCOSE: 135 mg/dL — AB (ref 65–99)
Potassium: 3.8 mmol/L (ref 3.5–5.1)
Sodium: 136 mmol/L (ref 135–145)
TOTAL PROTEIN: 7.2 g/dL (ref 6.5–8.1)

## 2017-02-18 LAB — GLUCOSE, CAPILLARY
GLUCOSE-CAPILLARY: 105 mg/dL — AB (ref 65–99)
Glucose-Capillary: 128 mg/dL — ABNORMAL HIGH (ref 65–99)

## 2017-02-18 LAB — CBC
HCT: 44.9 % (ref 35.0–47.0)
HEMOGLOBIN: 14.7 g/dL (ref 12.0–16.0)
MCH: 29.8 pg (ref 26.0–34.0)
MCHC: 32.8 g/dL (ref 32.0–36.0)
MCV: 90.9 fL (ref 80.0–100.0)
Platelets: 421 10*3/uL (ref 150–440)
RBC: 4.94 MIL/uL (ref 3.80–5.20)
RDW: 14 % (ref 11.5–14.5)
WBC: 11.1 10*3/uL — ABNORMAL HIGH (ref 3.6–11.0)

## 2017-02-18 LAB — DIFFERENTIAL
BASOS ABS: 0 10*3/uL (ref 0–0.1)
Basophils Relative: 0 %
Eosinophils Absolute: 0 10*3/uL (ref 0–0.7)
Eosinophils Relative: 0 %
LYMPHS ABS: 1.6 10*3/uL (ref 1.0–3.6)
LYMPHS PCT: 14 %
Monocytes Absolute: 1.1 10*3/uL — ABNORMAL HIGH (ref 0.2–0.9)
Monocytes Relative: 10 %
NEUTROS ABS: 8.3 10*3/uL — AB (ref 1.4–6.5)
NEUTROS PCT: 76 %

## 2017-02-18 LAB — TROPONIN I
Troponin I: 15.07 ng/mL (ref <0.03)
Troponin I: 13.38 ng/mL (ref <0.03)

## 2017-02-18 LAB — PROTIME-INR
INR: 1.06
Prothrombin Time: 13.8 seconds (ref 11.4–15.2)

## 2017-02-18 LAB — MRSA PCR SCREENING: MRSA BY PCR: NEGATIVE

## 2017-02-18 LAB — APTT: aPTT: 30 seconds (ref 24–36)

## 2017-02-18 MED ORDER — ASPIRIN 81 MG PO CHEW
324.0000 mg | CHEWABLE_TABLET | Freq: Once | ORAL | Status: DC
  Filled 2017-02-18: qty 4

## 2017-02-18 MED ORDER — ZOLPIDEM TARTRATE 5 MG PO TABS
5.0000 mg | ORAL_TABLET | Freq: Every evening | ORAL | Status: DC | PRN
  Administered 2017-02-18 – 2017-02-19 (×2): 5 mg via ORAL
  Filled 2017-02-18 (×2): qty 1

## 2017-02-18 MED ORDER — POLYETHYLENE GLYCOL 3350 17 G PO PACK: 17.0000 g | PACK | Freq: Every day | ORAL | Status: DC | PRN

## 2017-02-18 MED ORDER — ACETAMINOPHEN 650 MG RE SUPP: 650.0000 mg | Freq: Four times a day (QID) | RECTAL | Status: DC | PRN

## 2017-02-18 MED ORDER — HEPARIN (PORCINE) IN NACL 100-0.45 UNIT/ML-% IJ SOLN
850.0000 [IU]/h | INTRAMUSCULAR | Status: DC
  Administered 2017-02-18: 650 [IU]/h via INTRAVENOUS
  Filled 2017-02-18 (×2): qty 250

## 2017-02-18 MED ORDER — BUTALBITAL-APAP-CAFFEINE 50-325-40 MG PO TABS: 1.0000 | ORAL_TABLET | ORAL | Status: DC | PRN

## 2017-02-18 MED ORDER — ASPIRIN 300 MG RE SUPP
RECTAL | Status: AC
  Filled 2017-02-18: qty 1

## 2017-02-18 MED ORDER — ACETAMINOPHEN 325 MG PO TABS
650.0000 mg | ORAL_TABLET | Freq: Four times a day (QID) | ORAL | Status: DC | PRN
  Administered 2017-02-20: 650 mg via ORAL
  Filled 2017-02-18: qty 2

## 2017-02-18 MED ORDER — ONDANSETRON HCL 4 MG/2ML IJ SOLN: 4.0000 mg | Freq: Four times a day (QID) | INTRAMUSCULAR | Status: DC | PRN

## 2017-02-18 MED ORDER — ASPIRIN EC 81 MG PO TBEC
81.0000 mg | DELAYED_RELEASE_TABLET | Freq: Every day | ORAL | Status: DC
  Administered 2017-02-19: 81 mg via ORAL
  Filled 2017-02-18: qty 1

## 2017-02-18 MED ORDER — IOPAMIDOL (ISOVUE-370) INJECTION 76%
100.0000 mL | Freq: Once | INTRAVENOUS | Status: AC | PRN
  Administered 2017-02-18: 100 mL via INTRAVENOUS

## 2017-02-18 MED ORDER — TAMOXIFEN CITRATE 10 MG PO TABS
20.0000 mg | ORAL_TABLET | Freq: Every day | ORAL | Status: DC
  Administered 2017-02-18 – 2017-02-19 (×2): 20 mg via ORAL
  Filled 2017-02-18 (×3): qty 2

## 2017-02-18 MED ORDER — ASPIRIN 300 MG RE SUPP
300.0000 mg | Freq: Once | RECTAL | Status: AC
  Administered 2017-02-18: 300 mg via RECTAL

## 2017-02-18 MED ORDER — HEPARIN BOLUS VIA INFUSION
3000.0000 [IU] | Freq: Once | INTRAVENOUS | Status: AC
  Administered 2017-02-18: 3000 [IU] via INTRAVENOUS
  Filled 2017-02-18: qty 3000

## 2017-02-18 MED ORDER — SODIUM CHLORIDE 0.9% FLUSH
3.0000 mL | Freq: Two times a day (BID) | INTRAVENOUS | Status: DC
  Administered 2017-02-18 – 2017-02-20 (×4): 3 mL via INTRAVENOUS

## 2017-02-18 MED ORDER — ALBUTEROL SULFATE (2.5 MG/3ML) 0.083% IN NEBU: 2.5000 mg | INHALATION_SOLUTION | RESPIRATORY_TRACT | Status: DC | PRN

## 2017-02-18 MED ORDER — ONDANSETRON HCL 4 MG PO TABS: 4.0000 mg | ORAL_TABLET | Freq: Four times a day (QID) | ORAL | Status: DC | PRN

## 2017-02-18 MED ORDER — PRAVASTATIN SODIUM 20 MG PO TABS
40.0000 mg | ORAL_TABLET | Freq: Every day | ORAL | Status: DC
  Administered 2017-02-18: 40 mg via ORAL
  Filled 2017-02-18: qty 2

## 2017-02-18 MED ORDER — CYCLOBENZAPRINE HCL 10 MG PO TABS: 5.0000 mg | ORAL_TABLET | Freq: Two times a day (BID) | ORAL | Status: DC | PRN

## 2017-02-18 MED ORDER — TRAMADOL HCL 50 MG PO TABS: 50.0000 mg | ORAL_TABLET | Freq: Four times a day (QID) | ORAL | Status: DC | PRN

## 2017-02-18 NOTE — ED Notes (Signed)
Pt denies CP, SOB, n/v/d or dizziness. Pt ss that she feels embarassed

## 2017-02-18 NOTE — ED Notes (Signed)
Per Dr. Quentin Cornwall called [facility] to [facility] [phone]

## 2017-02-18 NOTE — H&P (Signed)
[place] [facility] [facility] [place] NAME: Marget Outten    MR#:  [id number]  DATE OF BIRTH:  [birth year]  DATE OF ADMISSION:  02/18/2017  PRIMARY CARE PHYSICIAN: [name], MD   REQUESTING/REFERRING PHYSICIAN: Dr. Quentin Cornwall  CHIEF COMPLAINT:   Chief Complaint  Patient presents with  . Aphasia    HISTORY OF PRESENT ILLNESS:  Ardella Chhim  is a 83 y.o. female with a known history of Hypertension, breast cancer presents to the emergency room with dysarthria since waking up today morning. Husband also noticed left facial droop. Last known well time is 10:30 PM. No other focal weakness or numbness. A code stroke was called. CT scan showed no acute stroke. An EKG was obtained which showed inferior wall ST elevation MI. Patient has been evaluated by cardiology at this time. She did have some vomiting and nausea and chest pain 2 days back with is thought to be be even bleeding to the MI. Presently patient is being admitted to ICU for medical management and neurology evaluation. Will need cardiac catheterization as per cardiology recommendations. Patient has no chest pain or shortness of breath at this time. Only complaint is dysarthria. No trouble swallowing.  PAST MEDICAL HISTORY:   Past Medical History:  Diagnosis Date  . Breast cancer ([place])    left 2013  . COPD (chronic obstructive pulmonary disease) ([place])   . GERD (gastroesophageal reflux disease)   . Hypercholesterolemia   . Hypertension     PAST SURGICAL HISTORY:   Past Surgical History:  Procedure Laterality Date  . BRAIN SURGERY    . BREAST BIOPSY Left    positive 03/2012  . BREAST BIOPSY Bilateral    negative 2000  . BREAST EXCISIONAL BIOPSY Left    positive 04/2012  . BREAST MAMMOSITE Left   . CATARACT EXTRACTION      SOCIAL HISTORY:   Social History  Substance Use Topics  . Smoking status: Never Smoker  . Smokeless tobacco: Never Used  . Alcohol use No    FAMILY HISTORY:    Family History  Problem Relation Age of Onset  . Breast cancer Sister 108    DRUG ALLERGIES:   Allergies  Allergen Reactions  . Codeine     Other reaction(s): Dizziness  . Fluocinolone Nausea And Vomiting  . Moxifloxacin     Other reaction(s): Vomiting  . Pravastatin     Other reaction(s): Muscle Pain  . Risedronate Other (See Comments)    Bone pain  . Tetracyclines & Related Nausea And Vomiting  . Niacin Rash    REVIEW OF SYSTEMS:   Review of Systems  Constitutional: Positive for malaise/fatigue. Negative for chills and fever.  HENT: Negative for sore throat.   Eyes: Negative for blurred vision, double vision and pain.  Respiratory: Negative for cough, hemoptysis, shortness of breath and wheezing.   Cardiovascular: Negative for chest pain, palpitations, orthopnea and leg swelling.  Gastrointestinal: Negative for abdominal pain, constipation, diarrhea, heartburn, nausea and vomiting.  Genitourinary: Negative for dysuria and hematuria.  Musculoskeletal: Negative for back pain and joint pain.  Skin: Negative for rash.  Neurological: Positive for speech change. Negative for sensory change, focal weakness and headaches.  Endo/Heme/Allergies: Does not bruise/bleed easily.  Psychiatric/Behavioral: Negative for depression. The patient is not nervous/anxious.     MEDICATIONS AT HOME:   Prior to Admission medications   Medication Sig Start Date End Date Taking? Authorizing Provider  aspirin EC 81 MG tablet  Take 81 mg by mouth at bedtime.    Yes [provider]  butalbital-acetaminophen-caffeine (FIORICET, ESGIC) 50-325-40 MG tablet Take 1 tablet by mouth every 4 (four) hours as needed for headache.    Yes [provider]  cyclobenzaprine (FLEXERIL) 10 MG tablet Take 5-10 mg by mouth 2 (two) times daily as needed for muscle spasms.   Yes [provider]  pravastatin (PRAVACHOL) 40 MG tablet Take 40 mg by mouth at bedtime.   Yes [provider]  tamoxifen (NOLVADEX) 20 MG tablet Take 20 mg by mouth at bedtime.    Yes [provider]  telmisartan (MICARDIS) 80 MG tablet Take 80 mg by mouth at bedtime.   Yes [provider]  traMADol (ULTRAM) 50 MG tablet Take 1 tablet (50 mg total) by mouth every 6 (six) hours as needed for moderate pain. 01/04/17  Yes Letitia Neri L, PA-C  zolpidem (AMBIEN) 5 MG tablet Take 5 mg by mouth at bedtime as needed for sleep.   Yes [provider]     VITAL SIGNS:  Blood pressure 127/86, pulse 82, temperature 97.7 F (36.5 C), temperature source Oral, resp. rate (!) 25, height 5\' 1"  (1.549 m), weight 56.7 kg (125 lb), SpO2 96 %.  PHYSICAL EXAMINATION:  Physical Exam  GENERAL:  83 y.o.-year-old patient lying in the bed with no acute distress.  EYES: Pupils equal, round, reactive to light and accommodation. No scleral icterus. Extraocular muscles intact.  HEENT: Head atraumatic, normocephalic. Oropharynx and nasopharynx clear. No oropharyngeal erythema, moist oral mucosa  NECK:  Supple, no jugular venous distention. No thyroid enlargement, no tenderness.  LUNGS: Normal breath sounds bilaterally, no wheezing, rales, rhonchi. No use of accessory muscles of respiration.  CARDIOVASCULAR: S1, S2 normal. No murmurs, rubs, or gallops.  ABDOMEN: Soft, nontender, nondistended. Bowel sounds present. No organomegaly or mass.  EXTREMITIES: No pedal edema, cyanosis, or clubbing. + 2 pedal & radial pulses b/l.   NEUROLOGIC: Cranial nerves II through XII are intact, with left facial droop. No focal Motor or sensory deficits appreciated b/l. dysarthria PSYCHIATRIC: The patient is alert and oriented x 3. Good affect.  SKIN: No obvious rash, lesion, or ulcer.   LABORATORY PANEL:   CBC  Recent Labs Lab 02/18/17 1359  WBC 11.1*  HGB 14.7  HCT 44.9  PLT 421    ------------------------------------------------------------------------------------------------------------------  Chemistries   Recent Labs Lab 02/18/17 1359  NA 136  K 3.8  CL 101  CO2 28  GLUCOSE 135*  BUN 13  CREATININE 0.82  CALCIUM 9.2  AST 87*  ALT 23  ALKPHOS 79  BILITOT 0.6   ------------------------------------------------------------------------------------------------------------------  Cardiac Enzymes  Recent Labs Lab 02/18/17 1359  TROPONINI 15.07*   ------------------------------------------------------------------------------------------------------------------  RADIOLOGY:  Ct Angio Head W Or Wo Contrast  Result Date: 02/18/2017 CLINICAL DATA:  The patient awoke at 9:30 earlier today with Slurred speech and RIGHT facial droop. History of breast cancer. EXAM: CT ANGIOGRAPHY HEAD AND NECK TECHNIQUE: Multidetector CT imaging of the head and neck was performed using the standard protocol during bolus administration of intravenous contrast. Multiplanar CT image reconstructions and MIPs were obtained to evaluate the vascular anatomy. Carotid stenosis measurements (when applicable) are obtained utilizing NASCET criteria, using the distal internal carotid diameter as the denominator. CONTRAST:  Isovue 370, 100 mL. COMPARISON:  CT chest reported separately. MR head 01/05/2013. FINDINGS: CT HEAD Calvarium and skull base: No fracture or destructive lesion. Mastoids and middle ears are grossly clear. LEFT frontal craniotomy appears  uncomplicated. Paranasal sinuses: Imaged portions are clear. Orbits: Negative. Brain: No evidence of acute abnormality, including acute infarct, hemorrhage, hydrocephalus, or mass lesion. LEFT frontal encephalomalacia, previous brain surgery, for unknown indication. Moderate atrophy, not unexpected for age. Extensive white matter disease. CTA NECK Aortic arch: Standard branching. Imaged portion shows no evidence of aneurysm or dissection. No  significant stenosis of the major arch vessel origins. Right carotid system: No significant plaque at the bifurcation. No evidence of dissection, stenosis (50% or greater) or occlusion. Left carotid system: No significant plaque at the bifurcation. No evidence of dissection, stenosis (50% or greater) or occlusion. Vertebral arteries: Codominant. No evidence of dissection, stenosis (50% or greater) or occlusion. Nonvascular soft tissues: No lung apex lesion or pneumothorax. No neck mass. Cervical spondylosis. Airway midline. CTA HEAD Anterior circulation: Dolichoectatic skull base internal carotid arteries with minimal calcification. Supraclinoid ICA segments widely patent. There is no proximal stenosis of the anterior, or middle cerebral arteries. Distal MCA branches widely patent. No significant stenosis, proximal occlusion, aneurysm, or vascular malformation. Posterior circulation: Codominant distal vertebral arteries. Dolichoectatic and widely patent basilar artery. Proximal posterior cerebral arteries widely patent. No significant stenosis, proximal occlusion, aneurysm, or vascular malformation. Venous sinuses: As permitted by contrast timing, patent. Anatomic variants: None of significance. Delayed phase:   No abnormal intracranial enhancement. Review of the MIP images confirms the above findings IMPRESSION: No intracranial or extracranial stenosis or dissection. No evidence for acute vascular occlusion. Atrophy and small vessel disease. Sequelae of remote LEFT frontal craniotomy and LEFT frontal encephalomalacia. No intracranial metastatic disease is evident. Electronically Signed   By: Staci Righter M.D.   On: 02/18/2017 15:08   Ct Angio Neck W And/or Wo Contrast  Result Date: 02/18/2017 CLINICAL DATA:  The patient awoke at 9:30 earlier today with Slurred speech and RIGHT facial droop. History of breast cancer. EXAM: CT ANGIOGRAPHY HEAD AND NECK TECHNIQUE: Multidetector CT imaging of the head and neck was  performed using the standard protocol during bolus administration of intravenous contrast. Multiplanar CT image reconstructions and MIPs were obtained to evaluate the vascular anatomy. Carotid stenosis measurements (when applicable) are obtained utilizing NASCET criteria, using the distal internal carotid diameter as the denominator. CONTRAST:  Isovue 370, 100 mL. COMPARISON:  CT chest reported separately. MR head 01/05/2013. FINDINGS: CT HEAD Calvarium and skull base: No fracture or destructive lesion. Mastoids and middle ears are grossly clear. LEFT frontal craniotomy appears uncomplicated. Paranasal sinuses: Imaged portions are clear. Orbits: Negative. Brain: No evidence of acute abnormality, including acute infarct, hemorrhage, hydrocephalus, or mass lesion. LEFT frontal encephalomalacia, previous brain surgery, for unknown indication. Moderate atrophy, not unexpected for age. Extensive white matter disease. CTA NECK Aortic arch: Standard branching. Imaged portion shows no evidence of aneurysm or dissection. No significant stenosis of the major arch vessel origins. Right carotid system: No significant plaque at the bifurcation. No evidence of dissection, stenosis (50% or greater) or occlusion. Left carotid system: No significant plaque at the bifurcation. No evidence of dissection, stenosis (50% or greater) or occlusion. Vertebral arteries: Codominant. No evidence of dissection, stenosis (50% or greater) or occlusion. Nonvascular soft tissues: No lung apex lesion or pneumothorax. No neck mass. Cervical spondylosis. Airway midline. CTA HEAD Anterior circulation: Dolichoectatic skull base internal carotid arteries with minimal calcification. Supraclinoid ICA segments widely patent. There is no proximal stenosis of the anterior, or middle cerebral arteries. Distal MCA branches widely patent. No significant stenosis, proximal occlusion, aneurysm, or vascular malformation. Posterior circulation: Codominant distal  vertebral arteries.  Dolichoectatic and widely patent basilar artery. Proximal posterior cerebral arteries widely patent. No significant stenosis, proximal occlusion, aneurysm, or vascular malformation. Venous sinuses: As permitted by contrast timing, patent. Anatomic variants: None of significance. Delayed phase:   No abnormal intracranial enhancement. Review of the MIP images confirms the above findings IMPRESSION: No intracranial or extracranial stenosis or dissection. No evidence for acute vascular occlusion. Atrophy and small vessel disease. Sequelae of remote LEFT frontal craniotomy and LEFT frontal encephalomalacia. No intracranial metastatic disease is evident. Electronically Signed   By: Staci Righter M.D.   On: 02/18/2017 15:08   Ct Angio Chest Aorta W And/or Wo Contrast  Result Date: 02/18/2017 CLINICAL DATA:  Slurred speech, RIGHT facial droop, concern for dissection of the aorta. EXAM: CT ANGIOGRAPHY CHEST WITH CONTRAST TECHNIQUE: Multidetector CT imaging of the chest was performed using the standard protocol during bolus administration of intravenous contrast. Multiplanar CT image reconstructions and MIPs were obtained to evaluate the vascular anatomy. CONTRAST:  Isovue 370, 100 mL. COMPARISON:  CT simulation chest from 05/06/2012. CTA head neck reported separately. FINDINGS: Cardiovascular: Preferential opacification of the thoracic aorta. No evidence of thoracic aortic aneurysm or dissection. Mild calcified atheromatous change along the transverse arch. Normal heart size. No pericardial effusion. Posterior wall plaque decreased in a descending aorta without significant luminal narrowing. Mediastinum/Nodes: No enlarged mediastinal, hilar, or axillary lymph nodes. Thyroid gland, trachea, and esophagus demonstrate no significant findings. Lungs/Pleura: There is a vague area of architectural distortion, posterior peripheral medial RIGHT lower lobe, image 37 series 5 which could be identified on the  previous CT Sim exam and is stable. No pulmonary nodules seen. There is no significant pleural effusion. Upper Abdomen: No acute abnormality. Musculoskeletal: No chest wall abnormality. No acute or significant osseous findings. Postsurgical changes, LEFT breast. Review of the MIP images confirms the above findings. IMPRESSION: No evidence for aortic dissection or other acute  chest pathology. Electronically Signed   By: Staci Righter M.D.   On: 02/18/2017 14:51     IMPRESSION AND PLAN:   * ST elevation MI, inferior Admit to ICU. Cardiology has evaluated the patient. At this time she will be on aspirin, statin, heparin drip. Telemetry monitoring. Echocardiogram. Cardiac catheterization as per patient's progress. Acute event likely occurred 2 days back.  * Acute CVA with expressive aphasia -Check MRI of the brain, Carotid dopplers, Echo - Start aspirin and statin. - Lovenox for DVT prophylaxis. - PT/OT/Speech consult as needed per symptoms - Neuro checks every 4 hours for 24 hours. - Consult neurology. - Hold blood pressure medications for permissive hypertension  * Hypertension. Hold medications.  * DVT prophylaxis. On heparin drip.  All the records are reviewed and case discussed with ED provider. Management plans discussed with the patient, family and they are in agreement.  CODE STATUS: DNR  TOTAL CC TIME TAKING CARE OF THIS PATIENT: 40 minutes.   Hillary Bow R M.D on 02/18/2017 at 4:17 PM  Between 7am to 6pm - Pager - [phone]  After 6pm go to [email or web address] - password [email or web address] [facility]  [employer]  Office  [phone]  CC: Primary care physician; [name], MD  Note: This dictation was prepared with Dragon dictation along with smaller phrase technology. Any transcriptional errors that result from this process are unintentional.

## 2017-02-18 NOTE — ED Notes (Signed)
MRI in room to take pt to scan.  This RN stopped tech, stating that pt was waiting for [facility] consult that required pt to be in room. This RN confirmed w/ MD Quentin Cornwall that he wanted [facility] consult before MRI

## 2017-02-18 NOTE — Consult Note (Signed)
Cardiology Consultation:   [name]; [id number]; Sep 02, 1930   Admit date: 02/18/2017 Date of Consult: 02/18/2017  Primary Care Provider: [name], MD Primary Cardiologist: New Primary Electrophysiologist:  None   Patient Profile:   Bianca Taylor Bianca Taylor is a 83 y.o. female with a hx of Hypertension, hyperlipidemia, breast cancer, COPD, GERD, and meningioma who is being seen today for the evaluation of abnormal EKG at the request of Merlyn Lot, M.D.  History of Present Illness:   Bianca Taylor presented to the ED today after her husband noted a facial droop and slurred speech. The patient reports that she was feeling relatively well last night. However, shortly after getting up this morning and having breakfast (approximately 9:30 AM), she had difficulty speaking. She did not have any pain. She denies other focal weakness. Her husband notes a facial droop several years ago, which was attributed to an eye problem. Upon arrival in the ED, EKG was obtained demonstrating inferior ST segment elevation.  Upon further questioning, Ms. Chesney reports an episode of severe substernal chest pain radiating to the upper abdomen with accompanying nausea and shortness of breath 2 nights ago. The pain lasted several hours, with multiple episodes of vomiting and dry heaving. The pain ultimately resolved spontaneously and did not recur yesterday or this morning. She has not had a history of prior heart problems. She is without chest pain and shortness of breath at this time. She also denies orthopnea, PND, and edema.  Past Medical History:  Diagnosis Date  . Breast cancer ([place])    left 2013  . COPD (chronic obstructive pulmonary disease) ([place])   . GERD (gastroesophageal reflux disease)   . Hypercholesterolemia   . Hypertension     Past Surgical History:  Procedure Laterality Date  . BRAIN SURGERY    . BREAST BIOPSY Left    positive 03/2012  . BREAST BIOPSY  Bilateral    negative 2000  . BREAST EXCISIONAL BIOPSY Left    positive 04/2012  . BREAST MAMMOSITE Left   . CATARACT EXTRACTION       Home Medications: No current facility-administered medications on file prior to encounter.    Current Outpatient Prescriptions on File Prior to Encounter  Medication Sig Dispense Refill  . aspirin EC 81 MG tablet Take 81 mg by mouth at bedtime.     . butalbital-acetaminophen-caffeine (FIORICET, ESGIC) 50-325-40 MG tablet Take 1 tablet by mouth every 4 (four) hours as needed for headache.     . tamoxifen (NOLVADEX) 20 MG tablet Take 20 mg by mouth at bedtime.     . traMADol (ULTRAM) 50 MG tablet Take 1 tablet (50 mg total) by mouth every 6 (six) hours as needed for moderate pain. 20 tablet 0    Allergies:    Allergies  Allergen Reactions  . Codeine     Other reaction(s): Dizziness  . Fluocinolone Nausea And Vomiting  . Moxifloxacin     Other reaction(s): Vomiting  . Pravastatin     Other reaction(s): Muscle Pain  . Risedronate Other (See Comments)    Bone pain  . Tetracyclines & Related Nausea And Vomiting  . Niacin Rash    Social History:   Social History   Social History  . Marital status: Married    Spouse name: N/A  . Number of children: N/A  . Years of education: N/A   Occupational History  . Not on file.   Social History Main Topics  .  Smoking status: Never Smoker  . Smokeless tobacco: Never Used  . Alcohol use No  . Drug use: Unknown  . Sexual activity: Not on file   Other Topics Concern  . Not on file   Social History Narrative  . No narrative on file    Family History:   The patient's family history includes Breast cancer (age of onset: 64) in her sister.  ROS:  The patient has continued left hand pain after a fall and fracture. Otherwise, a 12 system review of systems was performed and is negative except as noted in the history of present illness.  Physical Exam/Data:   Vitals:   02/18/17 1350 02/18/17 1401  02/18/17 1500  BP: 111/72 123/80 114/72  Pulse: (!) 102 98 80  Resp: 16  (!) 26  Temp: 97.7 F (36.5 C)    TempSrc: Oral    SpO2: 97% 96% 98%  Weight: 125 lb (56.7 kg)    Height: 5\' 1"  (1.549 m)     No intake or output data in the 24 hours ending 02/18/17 1505 Filed Weights   02/18/17 1350  Weight: 125 lb (56.7 kg)   Body mass index is 23.62 kg/m.  General:  Thin, elderly woman, lying on the emergency department gurney. Her husband, daughter-in-law, and son are at the bedside. HEENT: Normocephalic atraumatic. No conjunctival pallor or scleral icterus. Dry mucous membranes. Lymph: no adenopathy Neck: no JVD Endocrine:  No thryomegaly Vascular: No carotid bruits; 2+ radial, 1+ pedal pulses bilaterally. Cardiac:  Regular rate and rhythm without murmurs, rubs, or gallops. Lungs:  Normal work of breathing. Clear breath sounds anteriorly without wheezes or crackles. Abd: Bowel sounds present. Soft, nontender, nondistended without HSM. Ext: No lower extremity edema. Musculoskeletal:  No deformities. Skin: warm and dry  Neuro:  Facial droop noted with decreased movement of the right lip. Slight rightward deviation of the tongue. 5/5 right upper and bilateral lower extremity strength. 4/5 left upper external strength, though exam is somewhat limited by pain related to recent left hand fracture. Psych: Blunted affect.  EKG:  The EKG was personally reviewed and demonstrates sinus tachycardia with PAC. There are ST segment elevations in the inferior leads with ST depressions in V1 and V2. Inferolateral T-wave inversions are also present.  Relevant CV Studies: None  Laboratory Data:  Chemistry Recent Labs Lab 02/18/17 1359  NA 136  K 3.8  CL 101  CO2 28  GLUCOSE 135*  BUN 13  CREATININE 0.82  CALCIUM 9.2  GFRNONAA >60  GFRAA >60  ANIONGAP 7     Recent Labs Lab 02/18/17 1359  PROT 7.2  ALBUMIN 3.9  AST 87*  ALT 23  ALKPHOS 79  BILITOT 0.6   Hematology Recent  Labs Lab 02/18/17 1359  WBC 11.1*  RBC 4.94  HGB 14.7  HCT 44.9  MCV 90.9  MCH 29.8  MCHC 32.8  RDW 14.0  PLT 421   Cardiac Enzymes Recent Labs Lab 02/18/17 1359  TROPONINI 15.07*   No results for input(s): TROPIPOC in the last 168 hours.  BNPNo results for input(s): BNP, PROBNP in the last 168 hours.  DDimer No results for input(s): DDIMER in the last 168 hours.  Radiology/Studies:  Ct Angio Chest Aorta W And/or Wo Contrast  Result Date: 02/18/2017 CLINICAL DATA:  Slurred speech, RIGHT facial droop, concern for dissection of the aorta. EXAM: CT ANGIOGRAPHY CHEST WITH CONTRAST TECHNIQUE: Multidetector CT imaging of the chest was performed using the standard protocol during bolus administration of  intravenous contrast. Multiplanar CT image reconstructions and MIPs were obtained to evaluate the vascular anatomy. CONTRAST:  Isovue 370, 100 mL. COMPARISON:  CT simulation chest from 05/06/2012. CTA head neck reported separately. FINDINGS: Cardiovascular: Preferential opacification of the thoracic aorta. No evidence of thoracic aortic aneurysm or dissection. Mild calcified atheromatous change along the transverse arch. Normal heart size. No pericardial effusion. Posterior wall plaque decreased in a descending aorta without significant luminal narrowing. Mediastinum/Nodes: No enlarged mediastinal, hilar, or axillary lymph nodes. Thyroid gland, trachea, and esophagus demonstrate no significant findings. Lungs/Pleura: There is a vague area of architectural distortion, posterior peripheral medial RIGHT lower lobe, image 37 series 5 which could be identified on the previous CT Sim exam and is stable. No pulmonary nodules seen. There is no significant pleural effusion. Upper Abdomen: No acute abnormality. Musculoskeletal: No chest wall abnormality. No acute or significant osseous findings. Postsurgical changes, LEFT breast. Review of the MIP images confirms the above findings. IMPRESSION: No evidence  for aortic dissection or other acute  chest pathology. Electronically Signed   By: Staci Righter M.D.   On: 02/18/2017 14:51    Assessment and Plan:   1. STEMI EKG is consistent with STEMI, most likely due to acute plaque rupture with thrombus formation (type I MI). Presentation is complicated by lack of ongoing chest pain as well as acute neurologic changes concerning for stroke. Given that patient's chest pain with accompanying shortness of breath and nausea occurred 36-48 hours ago and initial troponin is 15, I favor this being a late presenting STEMI. I have spoken with the patient and her family at length regarding this finding and medical management versus emergent catheterization. We have agreed to pursue medical therapy, given time course and neurologic abnormalities.  Admit to ICU for medical management of inferior/posterior STEMI.  Continue aspirin. If felt safe to do so from a neurologic standpoint, would recommend IV heparin 48 hours. I am hesitant to add clopidogrel at this time, given acute neurologic event and history of falls.   Add high intensity statin therapy.  Obtain transthoracic echocardiogram.  Check lipid panel, hemoglobin A1c, and TSH.  Trend troponins until they have peaked, then stop.  Consider addition of low-dose beta blocker. We will defer this for the time being pending neurology evaluation to facilitate permissive hypertension in the setting of acute stroke.  2. Stroke Patient's chief complaint and neurologic exam are concerning for acute stroke. Symptoms seem to have started within the last 12-24 hours. No CT evidence of acute intracranial pathology.  Recommend urgent neurologic evaluation.  Aggressive secondary prevention.  3. Hypertension Blood pressure is normal. Recommend holding home antihypertensive regimen to allow for permissive hypertension pending neurologic evaluation.  Consider addition of low-dose beta blocker as heart rate and blood  pressure allow prior to discharge.  4. Hyperlipidemia Patient is on pravastatin at home. She would benefit from more aggressive lipid therapy.  Check fasting lipid panel.  I intensity statin therapy.  Signed, Nelva Bush, MD  02/18/2017 3:05 PM

## 2017-02-18 NOTE — ED Notes (Signed)
Critical troponin relayed to MD Quentin Cornwall, no new orders at this time

## 2017-02-18 NOTE — ED Triage Notes (Signed)
Arrives with slurred speech and right facial droop.  Husband states they went to bed last night at 2230, patient was free of symptoms.  Awoke this morning at 0930 and patient's speech was slurred and right facial droop.  MAE equally and strong.

## 2017-02-18 NOTE — ED Notes (Signed)
Patient transported to MRI 

## 2017-02-18 NOTE — ED Notes (Signed)
Called [facility] for neuro consult per Dr. Quentin Cornwall [phone]

## 2017-02-18 NOTE — ED Notes (Signed)
Pharmacy called for heparin, pharmacy tech sts that heparin had already been sent. This RN stated that she had not received heparin and did not find it around med station. Pharm tech stated that he would resend

## 2017-02-18 NOTE — ED Provider Notes (Signed)
[facility] Emergency Department Provider Note    None    (approximate)  I have reviewed the triage vital signs and the nursing notes.   HISTORY  Chief Complaint Aphasia    HPI Bianca Taylor is a 83 y.o. female history hypertension and elevated cholesterol as well as COPD presents with slurred speech and right facial droop. Last seen normal was last night at around 10:30. This morning patient woke up and the husband noted the symptoms as described previously. She denies any weakness. Denies any chest pain or shortness of breath. States that she's having difficulty getting her words out. Denies any history of MI. In triage EKG was ordered due to the above complaints that shows evidence of inferolateral ST elevation MI though the patient denies any chest pain as described above.   Past Medical History:  Diagnosis Date  . Breast cancer ([place])    left 2013  . COPD (chronic obstructive pulmonary disease) ([place])   . GERD (gastroesophageal reflux disease)   . Hypercholesterolemia   . Hypertension    Family History  Problem Relation Age of Onset  . Breast cancer Sister 36   Past Surgical History:  Procedure Laterality Date  . BRAIN SURGERY    . BREAST BIOPSY Left    positive 03/2012  . BREAST BIOPSY Bilateral    negative 2000  . BREAST EXCISIONAL BIOPSY Left    positive 04/2012  . BREAST MAMMOSITE Left   . CATARACT EXTRACTION     Patient Active Problem List   Diagnosis Date Noted  . STEMI (ST elevation myocardial infarction) ([place]) 02/18/2017      Prior to Admission medications   Medication Sig Start Date End Date Taking? Authorizing Provider  aspirin EC 81 MG tablet Take 81 mg by mouth at bedtime.    Yes [provider]  butalbital-acetaminophen-caffeine (FIORICET, ESGIC) 50-325-40 MG tablet Take 1 tablet by mouth every 4 (four) hours as needed for headache.    Yes [provider]  cyclobenzaprine (FLEXERIL) 10 MG tablet  Take 5-10 mg by mouth 2 (two) times daily as needed for muscle spasms.   Yes [provider]  pravastatin (PRAVACHOL) 40 MG tablet Take 40 mg by mouth at bedtime.   Yes [provider]  tamoxifen (NOLVADEX) 20 MG tablet Take 20 mg by mouth at bedtime.    Yes [provider]  telmisartan (MICARDIS) 80 MG tablet Take 80 mg by mouth at bedtime.   Yes [provider]  traMADol (ULTRAM) 50 MG tablet Take 1 tablet (50 mg total) by mouth every 6 (six) hours as needed for moderate pain. 01/04/17  Yes Letitia Neri L, PA-C  zolpidem (AMBIEN) 5 MG tablet Take 5 mg by mouth at bedtime as needed for sleep.   Yes [provider]    Allergies Codeine; Fluocinolone; Moxifloxacin; Pravastatin; Risedronate; Tetracyclines & related; and Niacin    Social History Social History  Substance Use Topics  . Smoking status: Never Smoker  . Smokeless tobacco: Never Used  . Alcohol use No    Review of Systems Patient denies headaches, rhinorrhea, blurry vision, numbness, shortness of breath, chest pain, edema, cough, abdominal pain, nausea, vomiting, diarrhea, dysuria, fevers, rashes or hallucinations unless otherwise stated above in HPI. ____________________________________________   PHYSICAL EXAM:  VITAL SIGNS: Vitals:   02/18/17 1530 02/18/17 1600  BP: 124/80 127/86  Pulse: 80 82  Resp: (!) 25 (!) 25  Temp:      Constitutional: Alert  and oriented. Well appearing and in no acute distress. Eyes: Conjunctivae are normal.  Head: Atraumatic. Nose: No congestion/rhinnorhea. Mouth/Throat: Mucous membranes are moist.   Neck: No stridor. Painless ROM.  Cardiovascular: Normal rate, regular rhythm. Grossly normal heart sounds.  Good peripheral circulation. Respiratory: Normal respiratory effort.  No retractions. Lungs CTAB. Gastrointestinal: Soft and nontender. No distention. No abdominal bruits. No CVA tenderness. Musculoskeletal: No lower extremity tenderness  nor edema.  No joint effusions. Neurologic:  Slurred speech but understandable.  MAE spontaneously.  Slight facial droop on right but is able to overcome this Skin:  Skin is warm, dry and intact. No rash noted. Psychiatric: Mood and affect are normal. Speech and behavior are normal.  ____________________________________________   LABS (all labs ordered are listed, but only abnormal results are displayed)  Results for orders placed or performed during the hospital encounter of 02/18/17 (from the past 24 hour(s))  Protime-INR     Status: None   Collection Time: 02/18/17  1:59 PM  Result Value Ref Range   Prothrombin Time 13.8 11.4 - 15.2 seconds   INR 1.06   APTT     Status: None   Collection Time: 02/18/17  1:59 PM  Result Value Ref Range   aPTT 30 24 - 36 seconds  CBC     Status: Abnormal   Collection Time: 02/18/17  1:59 PM  Result Value Ref Range   WBC 11.1 (H) 3.6 - 11.0 K/uL   RBC 4.94 3.80 - 5.20 MIL/uL   Hemoglobin 14.7 12.0 - 16.0 g/dL   HCT 44.9 35.0 - 47.0 %   MCV 90.9 80.0 - 100.0 fL   MCH 29.8 26.0 - 34.0 pg   MCHC 32.8 32.0 - 36.0 g/dL   RDW 14.0 11.5 - 14.5 %   Platelets 421 150 - 440 K/uL  Differential     Status: Abnormal   Collection Time: 02/18/17  1:59 PM  Result Value Ref Range   Neutrophils Relative % 76 %   Neutro Abs 8.3 (H) 1.4 - 6.5 K/uL   Lymphocytes Relative 14 %   Lymphs Abs 1.6 1.0 - 3.6 K/uL   Monocytes Relative 10 %   Monocytes Absolute 1.1 (H) 0.2 - 0.9 K/uL   Eosinophils Relative 0 %   Eosinophils Absolute 0.0 0 - 0.7 K/uL   Basophils Relative 0 %   Basophils Absolute 0.0 0 - 0.1 K/uL  Comprehensive metabolic panel     Status: Abnormal   Collection Time: 02/18/17  1:59 PM  Result Value Ref Range   Sodium 136 135 - 145 mmol/L   Potassium 3.8 3.5 - 5.1 mmol/L   Chloride 101 101 - 111 mmol/L   CO2 28 22 - 32 mmol/L   Glucose, Bld 135 (H) 65 - 99 mg/dL   BUN 13 6 - 20 mg/dL   Creatinine, Ser 0.82 0.44 - 1.00 mg/dL   Calcium 9.2 8.9 -  10.3 mg/dL   Total Protein 7.2 6.5 - 8.1 g/dL   Albumin 3.9 3.5 - 5.0 g/dL   AST 87 (H) 15 - 41 U/L   ALT 23 14 - 54 U/L   Alkaline Phosphatase 79 38 - 126 U/L   Total Bilirubin 0.6 0.3 - 1.2 mg/dL   GFR calc non Af Amer >60 >60 mL/min   GFR calc Af Amer >60 >60 mL/min   Anion gap 7 5 - 15  Troponin I     Status: Abnormal   Collection Time: 02/18/17  1:59 PM  Result Value Ref Range   Troponin I 15.07 (HH) <0.03 ng/mL  Glucose, capillary     Status: Abnormal   Collection Time: 02/18/17  2:09 PM  Result Value Ref Range   Glucose-Capillary 128 (H) 65 - 99 mg/dL   ____________________________________________  EKG My review and personal interpretation at Time: 13:56   Indication: aphasia  Rate: 100  Rhythm: sinus Axis: normal Other: normal intervals, inferior STEMI ____________________________________________  RADIOLOGY I personally reviewed all radiographic images ordered to evaluate for the above acute complaints and reviewed radiology reports and findings.  These findings were personally discussed with the patient.  Please see medical record for radiology report.  ____________________________________________   PROCEDURES  Procedure(s) performed:  Procedures    Critical Care performed: yes CRITICAL CARE Performed by: Merlyn Lot   Total critical care time: 50 minutes  Critical care time was exclusive of separately billable procedures and treating other patients.  Critical care was necessary to treat or prevent imminent or life-threatening deterioration.  Critical care was time spent personally by me on the following activities: development of treatment plan with patient and/or surrogate as well as nursing, discussions with consultants, evaluation of patient's response to treatment, examination of patient, obtaining history from patient or surrogate, ordering and performing treatments and interventions, ordering and review of laboratory studies, ordering and review  of radiographic studies, pulse oximetry and re-evaluation of patient's condition.  ____________________________________________   INITIAL IMPRESSION / ASSESSMENT AND PLAN / ED COURSE  Pertinent labs & imaging results that were available during my care of the patient were reviewed by me and considered in my medical decision making (see chart for details).  DDX: stemi, cva, dissection, hypoglycemia, infection  Bianca Taylor is a 83 y.o. who presents to the ED with symptoms concerning for slurred speech and right facial droop concerning for CVA. Last seen normal last night. Patient clinically on evaluation at bedside appears well. EKG shows evidence of acute inferior ST elevation MI with the patient denies any signs or symptoms of be consistent with acute MI. CT imaging will be ordered immediately to evaluate for any evidence of CVA, intracranial bleed or dissection.  The patient will be placed on continuous pulse oximetry and telemetry for monitoring.  Laboratory evaluation will be sent to evaluate for the above complaints.     Clinical Course as of Feb 18 1617  Wed Feb 18, 2017  1409 Patient remains human dynamically stable at this time. Dr. Saunders Revel interventional cardiology has been emergently paged due to concern for STEMI.  [PR]  1414 Patient is currently in [place] being evaluated for dissection or CVA. Spoke withDr. End was coming to the ER to evaluate patient at bedside.    [PR]  7654 My preliminary read of the CT angiogram I do not see any large dissection but will further review. After discussion with D. End at bedisde, will stat page neurology evaluation due to mixed picture with concern for cva and large therapeutic antiplatelet and anticoagulation therapy in cath lab.  [PR]  1452 I spoke neurology regarding the patient's presentation. Currently awaiting formal reads a CT imaging but it does appear that her symptoms are minimal at this time. Dr. Saunders Revel is at bedside evaluating patient.   [PR]  37 Dr. Saunders Revel is recommending primary medical management at this time as it does sound like she had chest pain 2 days ago and is delayed presenter. She denies any discomfort at this time. Patient being evaluated by neurology at this  time.  [PR]    Clinical Course User Index [PR] Merlyn Lot, MD     ____________________________________________   FINAL CLINICAL IMPRESSION(S) / ED DIAGNOSES  Final diagnoses:  ST elevation myocardial infarction (STEMI), unspecified artery ([place])  Slurred speech  Facial droop      NEW MEDICATIONS STARTED DURING THIS VISIT:  New Prescriptions   No medications on file     Note:  This document was prepared using Dragon voice recognition software and may include unintentional dictation errors.    Merlyn Lot, MD 02/18/17 [phone]

## 2017-02-18 NOTE — Consult Note (Signed)
Name: Bianca Taylor MRN: [id number] DOB: 28-Mar-1930    ADMISSION DATE:  02/18/2017 CONSULTATION DATE: 02/18/2017  REFERRING MD :  Dr. Darvin Neighbours for aphasia  CHIEF COMPLAINT:  Aphasia   BRIEF PATIENT DESCRIPTION:  83 yo female admitted 05/30 with acute CVA with expressive dysphasia and STEMI   SIGNIFICANT EVENTS  05/30-Pt admitted to [facility]   STUDIES:  CT Angio Head Neck and Chest 05/30>>No intracranial or extracranial stenosis or dissection. No evidence for acute vascular occlusion. Atrophy and small vessel disease. Sequelae of remote LEFT frontal craniotomy and LEFT frontal encephalomalacia. No intracranial metastatic disease is evident.  No evidence for aortic dissection or other acute chest pathology.  HISTORY OF PRESENT ILLNESS:   This is 83 yo female with a PMH of HTN, Hypercholesterolemia, GERD, COPD, and Left Breast Cancer.  She presented to [facility] ER 05/30 with dysarthria and left facial droop the morning of 05/30 her last know well time was 10:30 pm 05/29.  Due to symptoms a code stroke was initiated and CT head results revealed no acute stroke.  An EKG was obtained revealing inferior/posterior wall ST elevation MI and initial troponin 15.07, therefore the pt was evaluated by Cardiology.  Per ER notes the pt did have some vomiting, nausea, and chest pain 2 days ago 05/28. The evaluating Cardiologist recommended medical management, therefore the pt was subsequently admitted to the ICU for further workup and managment   PAST MEDICAL HISTORY :   has a past medical history of Breast cancer ([place]); COPD (chronic obstructive pulmonary disease) ([place]); GERD (gastroesophageal reflux disease); Hypercholesterolemia; and Hypertension.  has a past surgical history that includes Breast Mammosite (Left); Cataract extraction; Brain surgery; Breast biopsy (Left); Breast biopsy (Bilateral); and Breast excisional biopsy (Left). Prior to Admission medications   Medication Sig Start Date End  Date Taking? Authorizing Provider  aspirin EC 81 MG tablet Take 81 mg by mouth at bedtime.    Yes [provider]  butalbital-acetaminophen-caffeine (FIORICET, ESGIC) 50-325-40 MG tablet Take 1 tablet by mouth every 4 (four) hours as needed for headache.    Yes [provider]  cyclobenzaprine (FLEXERIL) 10 MG tablet Take 5-10 mg by mouth 2 (two) times daily as needed for muscle spasms.   Yes [provider]  pravastatin (PRAVACHOL) 40 MG tablet Take 40 mg by mouth at bedtime.   Yes [provider]  tamoxifen (NOLVADEX) 20 MG tablet Take 20 mg by mouth at bedtime.    Yes [provider]  telmisartan (MICARDIS) 80 MG tablet Take 80 mg by mouth at bedtime.   Yes [provider]  traMADol (ULTRAM) 50 MG tablet Take 1 tablet (50 mg total) by mouth every 6 (six) hours as needed for moderate pain. 01/04/17  Yes Letitia Neri L, PA-C  zolpidem (AMBIEN) 5 MG tablet Take 5 mg by mouth at bedtime as needed for sleep.   Yes [provider]   Allergies  Allergen Reactions  . Codeine     Other reaction(s): Dizziness  . Fluocinolone Nausea And Vomiting  . Moxifloxacin     Other reaction(s): Vomiting  . Pravastatin     Other reaction(s): Muscle Pain  . Risedronate Other (See Comments)    Bone pain  . Tetracyclines & Related Nausea And Vomiting  . Niacin Rash    FAMILY HISTORY:  family history includes Breast cancer (age of onset: 89) in her sister. SOCIAL HISTORY:  reports that she has never smoked. She has never used smokeless tobacco.  She reports that she does not drink alcohol.  REVIEW OF SYSTEMS:  Positives in BOLD  Constitutional: Negative for fever, chills, weight loss, malaise/fatigue and diaphoresis.  HENT: Negative for hearing loss, ear pain, nosebleeds, congestion, sore throat, neck pain, tinnitus and ear discharge.   Eyes: Negative for blurred vision, double vision, photophobia, pain, discharge and redness.  Respiratory:  Negative for cough, hemoptysis, sputum production, shortness of breath, wheezing and stridor.   Cardiovascular: Negative for chest pain, palpitations, orthopnea, claudication, leg swelling and PND.  Gastrointestinal: Negative for heartburn, nausea, vomiting, abdominal pain, diarrhea, constipation, blood in stool and melena.  Genitourinary: Negative for dysuria, urgency, frequency, hematuria and flank pain.  Musculoskeletal: Negative for myalgias, back pain, joint pain and falls.  Skin: Negative for itching and rash.  Neurological: Negative for dizziness, tingling, tremors, sensory change, speech change, focal weakness, seizures, loss of consciousness, weakness and headaches.  Endo/Heme/Allergies: Negative for environmental allergies and polydipsia. Does not bruise/bleed easily.  SUBJECTIVE: Patient states that "she feels ok at this time"  VITAL SIGNS: Temp:  [97.7 F (36.5 C)] 97.7 F (36.5 C) (05/30 1350) Pulse Rate:  [80-102] 82 (05/30 1600) Resp:  [16-26] 25 (05/30 1600) BP: (111-127)/(72-86) 127/86 (05/30 1600) SpO2:  [96 %-98 %] 96 % (05/30 1600) Weight:  [56.7 kg (125 lb)] 56.7 kg (125 lb) (05/30 1350)  PHYSICAL EXAMINATION: General:  Elderly white female, found laying on bed in no acute distress Neuro:  Right facial droop, right sided weakness HEENT:  AT,[place],No JVD, PERRLA Cardiovascular:S1S2,Regular, no m/r/g Lungs:  Clear bilaterally, no wheezes, crackles or rhonchi noted Abdomen:  Soft,NT,ND,+BS Musculoskeletal:  No edema, cyanosis noted Skin:  Warm, dry and intact   Recent Labs Lab 02/18/17 1359  NA 136  K 3.8  CL 101  CO2 28  BUN 13  CREATININE 0.82  GLUCOSE 135*    Recent Labs Lab 02/18/17 1359  HGB 14.7  HCT 44.9  WBC 11.1*  PLT 421   Ct Angio Head W Or Wo Contrast  Result Date: 02/18/2017 CLINICAL DATA:  The patient awoke at 9:30 earlier today with Slurred speech and RIGHT facial droop. History of breast cancer. EXAM: CT ANGIOGRAPHY HEAD AND NECK  TECHNIQUE: Multidetector CT imaging of the head and neck was performed using the standard protocol during bolus administration of intravenous contrast. Multiplanar CT image reconstructions and MIPs were obtained to evaluate the vascular anatomy. Carotid stenosis measurements (when applicable) are obtained utilizing NASCET criteria, using the distal internal carotid diameter as the denominator. CONTRAST:  Isovue 370, 100 mL. COMPARISON:  CT chest reported separately. MR head 01/05/2013. FINDINGS: CT HEAD Calvarium and skull base: No fracture or destructive lesion. Mastoids and middle ears are grossly clear. LEFT frontal craniotomy appears uncomplicated. Paranasal sinuses: Imaged portions are clear. Orbits: Negative. Brain: No evidence of acute abnormality, including acute infarct, hemorrhage, hydrocephalus, or mass lesion. LEFT frontal encephalomalacia, previous brain surgery, for unknown indication. Moderate atrophy, not unexpected for age. Extensive white matter disease. CTA NECK Aortic arch: Standard branching. Imaged portion shows no evidence of aneurysm or dissection. No significant stenosis of the major arch vessel origins. Right carotid system: No significant plaque at the bifurcation. No evidence of dissection, stenosis (50% or greater) or occlusion. Left carotid system: No significant plaque at the bifurcation. No evidence of dissection, stenosis (50% or greater) or occlusion. Vertebral arteries: Codominant. No evidence of dissection, stenosis (50% or greater) or occlusion. Nonvascular soft tissues: No lung apex lesion or pneumothorax. No neck mass. Cervical spondylosis. Airway midline.  CTA HEAD Anterior circulation: Dolichoectatic skull base internal carotid arteries with minimal calcification. Supraclinoid ICA segments widely patent. There is no proximal stenosis of the anterior, or middle cerebral arteries. Distal MCA branches widely patent. No significant stenosis, proximal occlusion, aneurysm, or  vascular malformation. Posterior circulation: Codominant distal vertebral arteries. Dolichoectatic and widely patent basilar artery. Proximal posterior cerebral arteries widely patent. No significant stenosis, proximal occlusion, aneurysm, or vascular malformation. Venous sinuses: As permitted by contrast timing, patent. Anatomic variants: None of significance. Delayed phase:   No abnormal intracranial enhancement. Review of the MIP images confirms the above findings IMPRESSION: No intracranial or extracranial stenosis or dissection. No evidence for acute vascular occlusion. Atrophy and small vessel disease. Sequelae of remote LEFT frontal craniotomy and LEFT frontal encephalomalacia. No intracranial metastatic disease is evident. Electronically Signed   By: Staci Righter M.D.   On: 02/18/2017 15:08   Ct Angio Neck W And/or Wo Contrast  Result Date: 02/18/2017 CLINICAL DATA:  The patient awoke at 9:30 earlier today with Slurred speech and RIGHT facial droop. History of breast cancer. EXAM: CT ANGIOGRAPHY HEAD AND NECK TECHNIQUE: Multidetector CT imaging of the head and neck was performed using the standard protocol during bolus administration of intravenous contrast. Multiplanar CT image reconstructions and MIPs were obtained to evaluate the vascular anatomy. Carotid stenosis measurements (when applicable) are obtained utilizing NASCET criteria, using the distal internal carotid diameter as the denominator. CONTRAST:  Isovue 370, 100 mL. COMPARISON:  CT chest reported separately. MR head 01/05/2013. FINDINGS: CT HEAD Calvarium and skull base: No fracture or destructive lesion. Mastoids and middle ears are grossly clear. LEFT frontal craniotomy appears uncomplicated. Paranasal sinuses: Imaged portions are clear. Orbits: Negative. Brain: No evidence of acute abnormality, including acute infarct, hemorrhage, hydrocephalus, or mass lesion. LEFT frontal encephalomalacia, previous brain surgery, for unknown  indication. Moderate atrophy, not unexpected for age. Extensive white matter disease. CTA NECK Aortic arch: Standard branching. Imaged portion shows no evidence of aneurysm or dissection. No significant stenosis of the major arch vessel origins. Right carotid system: No significant plaque at the bifurcation. No evidence of dissection, stenosis (50% or greater) or occlusion. Left carotid system: No significant plaque at the bifurcation. No evidence of dissection, stenosis (50% or greater) or occlusion. Vertebral arteries: Codominant. No evidence of dissection, stenosis (50% or greater) or occlusion. Nonvascular soft tissues: No lung apex lesion or pneumothorax. No neck mass. Cervical spondylosis. Airway midline. CTA HEAD Anterior circulation: Dolichoectatic skull base internal carotid arteries with minimal calcification. Supraclinoid ICA segments widely patent. There is no proximal stenosis of the anterior, or middle cerebral arteries. Distal MCA branches widely patent. No significant stenosis, proximal occlusion, aneurysm, or vascular malformation. Posterior circulation: Codominant distal vertebral arteries. Dolichoectatic and widely patent basilar artery. Proximal posterior cerebral arteries widely patent. No significant stenosis, proximal occlusion, aneurysm, or vascular malformation. Venous sinuses: As permitted by contrast timing, patent. Anatomic variants: None of significance. Delayed phase:   No abnormal intracranial enhancement. Review of the MIP images confirms the above findings IMPRESSION: No intracranial or extracranial stenosis or dissection. No evidence for acute vascular occlusion. Atrophy and small vessel disease. Sequelae of remote LEFT frontal craniotomy and LEFT frontal encephalomalacia. No intracranial metastatic disease is evident. Electronically Signed   By: Staci Righter M.D.   On: 02/18/2017 15:08   Ct Angio Chest Aorta W And/or Wo Contrast  Result Date: 02/18/2017 CLINICAL DATA:  Slurred  speech, RIGHT facial droop, concern for dissection of the aorta. EXAM: CT ANGIOGRAPHY CHEST WITH  CONTRAST TECHNIQUE: Multidetector CT imaging of the chest was performed using the standard protocol during bolus administration of intravenous contrast. Multiplanar CT image reconstructions and MIPs were obtained to evaluate the vascular anatomy. CONTRAST:  Isovue 370, 100 mL. COMPARISON:  CT simulation chest from 05/06/2012. CTA head neck reported separately. FINDINGS: Cardiovascular: Preferential opacification of the thoracic aorta. No evidence of thoracic aortic aneurysm or dissection. Mild calcified atheromatous change along the transverse arch. Normal heart size. No pericardial effusion. Posterior wall plaque decreased in a descending aorta without significant luminal narrowing. Mediastinum/Nodes: No enlarged mediastinal, hilar, or axillary lymph nodes. Thyroid gland, trachea, and esophagus demonstrate no significant findings. Lungs/Pleura: There is a vague area of architectural distortion, posterior peripheral medial RIGHT lower lobe, image 37 series 5 which could be identified on the previous CT Sim exam and is stable. No pulmonary nodules seen. There is no significant pleural effusion. Upper Abdomen: No acute abnormality. Musculoskeletal: No chest wall abnormality. No acute or significant osseous findings. Postsurgical changes, LEFT breast. Review of the MIP images confirms the above findings. IMPRESSION: No evidence for aortic dissection or other acute  chest pathology. Electronically Signed   By: Staci Righter M.D.   On: 02/18/2017 14:51    ASSESSMENT / PLAN:  Inferior/posterior wall MI  ?Acute CVA with expressive dysphagia   Hx: HTN, Hyperlipidemia, COPD, and GERD  P: Supplemental O2 to maintain O2 sats 90% to 94%  Trend troponin's  Heparin gtt dosing per pharmacy   Continue Aspirin  Continue pravastatin  Neurology and Cardiology consulted appreciate input   Echo and US Carotid  Bilateral pending  Prn bronchodilator therapy   Lipid panel., hemoglobin A1c, and TSH pending   Continuous telemetry monitoring   Aspiration precautions   Consider addition of low-dose beta blocker. We will defer this for the time being pending neurology evaluation to facilitate permissive hypertension in the setting of acute stroke.    [facility]   Patient seen and examined with NP, agree with findings, assessment, plan. The patient is an 83 year old female who is DO NOT RESUSCITATE. He had some vomiting several days ago. Per the family at bedside. However, he presented with symptoms of a stroke. Patient is awake and alert, though has a slight facial droop. Lungs are clear to auscultation. I personally reviewed the patient's CT chest imaging, which shows normal lungs. Patient's MRI showed an acute lacunar infarct with evidence of chronic hypertensive findings, and old strokes. Troponin was significantly elevated, consistent with late presentation of inferior ST elevation myocardial infarction. The patient has been evaluated by cardiology, it was recommended that the patient undergo conservative management. We'll continue aspirin, statin. Agree with discontinuation of heparin given acute stroke. Patient may benefit from addition of additional antiplatelet agent, we'll await further evaluation by neurology.  Marda Stalker, M.D. 02/19/2017

## 2017-02-18 NOTE — Progress Notes (Signed)
ANTICOAGULATION CONSULT NOTE - Initial Consult  Pharmacy Consult for heparin drip  Indication: chest pain/ACS  Allergies  Allergen Reactions  . Codeine     Other reaction(s): Dizziness  . Fluocinolone Nausea And Vomiting  . Moxifloxacin     Other reaction(s): Vomiting  . Pravastatin     Other reaction(s): Muscle Pain  . Risedronate Other (See Comments)    Bone pain  . Tetracyclines & Related Nausea And Vomiting  . Niacin Rash    Patient Measurements: Height: 5\' 1"  (154.9 cm) Weight: 125 lb (56.7 kg) IBW/kg (Calculated) : 47.8  Vital Signs: Temp: 97.7 F (36.5 C) (05/30 1350) Temp Source: Oral (05/30 1350) BP: 114/72 (05/30 1500) Pulse Rate: 80 (05/30 1500)  Labs:  Recent Labs  02/18/17 1359  HGB 14.7  HCT 44.9  PLT 421  APTT 30  LABPROT 13.8  INR 1.06  CREATININE 0.82  TROPONINI 15.07*    Estimated Creatinine Clearance: 38.5 mL/min (by C-G formula based on SCr of 0.82 mg/dL).   Medical History: Past Medical History:  Diagnosis Date  . Breast cancer ([place])    left 2013  . COPD (chronic obstructive pulmonary disease) ([place])   . GERD (gastroesophageal reflux disease)   . Hypercholesterolemia   . Hypertension    Assessment: Pharmacy consulted for heparin dosing and monitoring in 83 yo female with ACS/STEMI.  Troponin I: 15.07  Goal of Therapy:  Heparin level 0.3-0.7 units/ml Monitor platelets by anticoagulation protocol: Yes   Plan:  Baseline labs ordered Give 3000 units bolus x 1 Start heparin infusion at 650 units/hr Check anti-Xa level in 8 hours and daily while on heparin Continue to monitor H&H and platelets  Pernell Dupre, PharmD, BCPS Clinical Pharmacist 02/18/2017 3:10 PM

## 2017-02-18 NOTE — Progress Notes (Signed)
eLink Physician-Brief Progress Note Patient Name: Bianca Taylor DOB: [birth year] MRN: [id number]   Date of Service  02/18/2017  HPI/Events of Note  83 year old female admitted with non-STEMI and acute stroke. Cardiology consulted as well as tele neurology. Currently on heparin drip. MRI pending. Patient DO NOT RESUSCITATE per admitting physician. Camera check shows patient comfortably lying in bed with multiple family members at bedside.   eICU Interventions  Continuing current plan of care as per admitting physician and consultants.      Intervention Category Evaluation Type: New Patient Evaluation  Tera Partridge 02/18/2017, 8:14 PM

## 2017-02-18 NOTE — Progress Notes (Signed)
Chaplain received an order to visit with pt in room IC13. Chaplain visited with pt and family. Pt had a heart attack and a possible stroke. Family wanted a prayer for healing so they can be discharged. Chaplain provided emotional support as well as prayer.    02/18/17 2055  Clinical Encounter Type  Visited With Patient;Patient and family together  Visit Type Initial;Spiritual support  Referral From Nurse  Consult/Referral To Chaplain  Spiritual Encounters  Spiritual Needs Prayer;Emotional

## 2017-02-18 NOTE — ED Notes (Signed)
Patient transported to CT 

## 2017-02-18 NOTE — ED Notes (Signed)
MRI called, informed MRI tech that MD Quentin Cornwall had given okay for pt to go to MRI before [facility] completed. MRI tech sts that they "have one on the table, and one outpatient waiting" and that they would "try to cycle her through".

## 2017-02-18 NOTE — Progress Notes (Signed)
[place] responded to a Code Stemi in ED-12. Pt was out of the room for tests. [place] provided the ministry of empathetic listening, a calming presence and prayer to the family who were waiting in the room. Family is spiritually strong and appreciated the visit. CH is available for follow up as needed.    02/18/17 1600  Clinical Encounter Type  Visited With Patient;Patient and family together  Visit Type Initial;Spiritual support;Code;ED (Code Stemi)  Referral From Nurse  Consult/Referral To Chaplain  Spiritual Encounters  Spiritual Needs Prayer;Emotional

## 2017-02-18 NOTE — ED Notes (Signed)
Consulting provider at bedside

## 2017-02-18 NOTE — ED Notes (Signed)
ED Provider at bedside. 

## 2017-02-19 ENCOUNTER — Inpatient Hospital Stay (HOSPITAL_COMMUNITY)
Admit: 2017-02-19 | Discharge: 2017-02-19 | Disposition: A | Discharge status: Home / Self Care | Payer: Medicare Other | Attending: Internal Medicine | Admitting: Internal Medicine

## 2017-02-19 DIAGNOSIS — I213 ST elevation (STEMI) myocardial infarction of unspecified site: Secondary | ICD-10-CM

## 2017-02-19 DIAGNOSIS — I2119 ST elevation (STEMI) myocardial infarction involving other coronary artery of inferior wall: Secondary | ICD-10-CM

## 2017-02-19 LAB — BASIC METABOLIC PANEL
Anion gap: 7 (ref 5–15)
BUN: 14 mg/dL (ref 6–20)
CHLORIDE: 103 mmol/L (ref 101–111)
CO2: 28 mmol/L (ref 22–32)
Calcium: 8.8 mg/dL — ABNORMAL LOW (ref 8.9–10.3)
Creatinine, Ser: 0.67 mg/dL (ref 0.44–1.00)
GFR calc Af Amer: >60 mL/min (ref >60)
GFR calc non Af Amer: >60 mL/min (ref >60)
GLUCOSE: 106 mg/dL — AB (ref 65–99)
POTASSIUM: 3.6 mmol/L (ref 3.5–5.1)
Sodium: 138 mmol/L (ref 135–145)

## 2017-02-19 LAB — ECHOCARDIOGRAM COMPLETE
Height: 61 in
WEIGHTICAEL: 1834.23 [oz_av]

## 2017-02-19 LAB — CBC
HEMATOCRIT: 40.8 % (ref 35.0–47.0)
Hemoglobin: 13.6 g/dL (ref 12.0–16.0)
MCH: 30.2 pg (ref 26.0–34.0)
MCHC: 33.2 g/dL (ref 32.0–36.0)
MCV: 91.1 fL (ref 80.0–100.0)
Platelets: 382 10*3/uL (ref 150–440)
RBC: 4.48 MIL/uL (ref 3.80–5.20)
RDW: 13.4 % (ref 11.5–14.5)
WBC: 9.4 10*3/uL (ref 3.6–11.0)

## 2017-02-19 LAB — LIPID PANEL
Cholesterol: 171 mg/dL (ref 0–200)
HDL: 49 mg/dL (ref >40)
LDL CALC: 99 mg/dL (ref 0–99)
Total CHOL/HDL Ratio: 3.5 RATIO
Triglycerides: 114 mg/dL (ref <150)
VLDL: 23 mg/dL (ref 0–40)

## 2017-02-19 LAB — HEPARIN LEVEL (UNFRACTIONATED): HEPARIN UNFRACTIONATED: 0.11 [IU]/mL — AB (ref 0.30–0.70)

## 2017-02-19 LAB — TROPONIN I: TROPONIN I: 10.95 ng/mL — AB (ref <0.03)

## 2017-02-19 MED ORDER — ATORVASTATIN CALCIUM 20 MG PO TABS
40.0000 mg | ORAL_TABLET | Freq: Every day | ORAL | Status: DC
  Administered 2017-02-19: 40 mg via ORAL
  Filled 2017-02-19: qty 2

## 2017-02-19 MED ORDER — ENOXAPARIN SODIUM 40 MG/0.4ML ~~LOC~~ SOLN
40.0000 mg | SUBCUTANEOUS | Status: DC
  Administered 2017-02-19: 40 mg via SUBCUTANEOUS
  Filled 2017-02-19: qty 0.4

## 2017-02-19 MED ORDER — HEPARIN BOLUS VIA INFUSION
1700.0000 [IU] | Freq: Once | INTRAVENOUS | Status: AC
  Administered 2017-02-19: 1700 [IU] via INTRAVENOUS
  Filled 2017-02-19: qty 1700

## 2017-02-19 MED ORDER — CLOPIDOGREL BISULFATE 75 MG PO TABS
75.0000 mg | ORAL_TABLET | Freq: Every day | ORAL | Status: DC
  Administered 2017-02-19 – 2017-02-20 (×2): 75 mg via ORAL
  Filled 2017-02-19 (×2): qty 1

## 2017-02-19 NOTE — Progress Notes (Signed)
Progress Note  Patient Name: Bianca Taylor Date of Encounter: 02/19/2017  Primary Cardiologist: New ( Seen by Dr. Saunders Revel)  Subjective   She denies any chest pain or shortness of breath. She had nausea and vomiting on Monday and I suspect that her myocardial infarction started at that time. MRI of the brain confirmed acute stroke.  Inpatient Medications    Scheduled Meds: . aspirin EC  81 mg Oral QHS  . atorvastatin  40 mg Oral q1800  . sodium chloride flush  3 mL Intravenous Q12H  . tamoxifen  20 mg Oral QHS   Continuous Infusions:  PRN Meds: acetaminophen **OR** acetaminophen, albuterol, butalbital-acetaminophen-caffeine, cyclobenzaprine, ondansetron **OR** ondansetron (ZOFRAN) IV, polyethylene glycol, traMADol, zolpidem   Vital Signs    Vitals:   02/19/17 0300 02/19/17 0400 02/19/17 0500 02/19/17 0600  BP: 124/76 119/74 125/79 121/74  Pulse: 71 82 85 69  Resp: (!) 22 (!) 25 (!) 28 (!) 23  Temp:      TempSrc:      SpO2: 91% 91% 93% 92%  Weight:   114 lb 10.2 oz (52 kg)   Height:        Intake/Output Summary (Last 24 hours) at 02/19/17 0824 Last data filed at 02/19/17 0600  Gross per 24 hour  Intake            89.27 ml  Output              300 ml  Net          -210.73 ml   Filed Weights   02/18/17 1350 02/19/17 0500  Weight: 125 lb (56.7 kg) 114 lb 10.2 oz (52 kg)    Telemetry    Normal sinus rhythm with no arrhythmia. - Personally Reviewed  ECG     Normal sinus rhythm with a 3-4 mm ST elevation in the inferior leads with reciprocal changes in V1 and V2 - Personally Reviewed  Physical Exam   GEN: No acute distress.   Neck: No JVD Cardiac: RRR, no murmurs, rubs, or gallops.  Respiratory: Clear to auscultation bilaterally. GI: Soft, nontender, non-distended  MS: No edema; No deformity. Neuro:  Facial droop Psych: Normal affect   Labs    Chemistry Recent Labs Lab 02/18/17 1359 02/19/17 0200  NA 136 138  K 3.8 3.6  CL 101 103  CO2 28 28    GLUCOSE 135* 106*  BUN 13 14  CREATININE 0.82 0.67  CALCIUM 9.2 8.8*  PROT 7.2  --   ALBUMIN 3.9  --   AST 87*  --   ALT 23  --   ALKPHOS 79  --   BILITOT 0.6  --   GFRNONAA >60 >60  GFRAA >60 >60  ANIONGAP 7 7     Hematology Recent Labs Lab 02/18/17 1359 02/19/17 0200  WBC 11.1* 9.4  RBC 4.94 4.48  HGB 14.7 13.6  HCT 44.9 40.8  MCV 90.9 91.1  MCH 29.8 30.2  MCHC 32.8 33.2  RDW 14.0 13.4  PLT 421 382    Cardiac Enzymes Recent Labs Lab 02/18/17 1359 02/18/17 2001 02/19/17 0200  TROPONINI 15.07* 13.38* 10.95*   No results for input(s): TROPIPOC in the last 168 hours.   BNPNo results for input(s): BNP, PROBNP in the last 168 hours.   DDimer No results for input(s): DDIMER in the last 168 hours.   Radiology    Ct Angio Head W Or Wo Contrast  Result Date: 02/18/2017 CLINICAL DATA:  The  patient awoke at 9:30 earlier today with Slurred speech and RIGHT facial droop. History of breast cancer. EXAM: CT ANGIOGRAPHY HEAD AND NECK TECHNIQUE: Multidetector CT imaging of the head and neck was performed using the standard protocol during bolus administration of intravenous contrast. Multiplanar CT image reconstructions and MIPs were obtained to evaluate the vascular anatomy. Carotid stenosis measurements (when applicable) are obtained utilizing NASCET criteria, using the distal internal carotid diameter as the denominator. CONTRAST:  Isovue 370, 100 mL. COMPARISON:  CT chest reported separately. MR head 01/05/2013. FINDINGS: CT HEAD Calvarium and skull base: No fracture or destructive lesion. Mastoids and middle ears are grossly clear. LEFT frontal craniotomy appears uncomplicated. Paranasal sinuses: Imaged portions are clear. Orbits: Negative. Brain: No evidence of acute abnormality, including acute infarct, hemorrhage, hydrocephalus, or mass lesion. LEFT frontal encephalomalacia, previous brain surgery, for unknown indication. Moderate atrophy, not unexpected for age. Extensive  white matter disease. CTA NECK Aortic arch: Standard branching. Imaged portion shows no evidence of aneurysm or dissection. No significant stenosis of the major arch vessel origins. Right carotid system: No significant plaque at the bifurcation. No evidence of dissection, stenosis (50% or greater) or occlusion. Left carotid system: No significant plaque at the bifurcation. No evidence of dissection, stenosis (50% or greater) or occlusion. Vertebral arteries: Codominant. No evidence of dissection, stenosis (50% or greater) or occlusion. Nonvascular soft tissues: No lung apex lesion or pneumothorax. No neck mass. Cervical spondylosis. Airway midline. CTA HEAD Anterior circulation: Dolichoectatic skull base internal carotid arteries with minimal calcification. Supraclinoid ICA segments widely patent. There is no proximal stenosis of the anterior, or middle cerebral arteries. Distal MCA branches widely patent. No significant stenosis, proximal occlusion, aneurysm, or vascular malformation. Posterior circulation: Codominant distal vertebral arteries. Dolichoectatic and widely patent basilar artery. Proximal posterior cerebral arteries widely patent. No significant stenosis, proximal occlusion, aneurysm, or vascular malformation. Venous sinuses: As permitted by contrast timing, patent. Anatomic variants: None of significance. Delayed phase:   No abnormal intracranial enhancement. Review of the MIP images confirms the above findings IMPRESSION: No intracranial or extracranial stenosis or dissection. No evidence for acute vascular occlusion. Atrophy and small vessel disease. Sequelae of remote LEFT frontal craniotomy and LEFT frontal encephalomalacia. No intracranial metastatic disease is evident. Electronically Signed   By: Staci Righter M.D.   On: 02/18/2017 15:08   Ct Angio Neck W And/or Wo Contrast  Result Date: 02/18/2017 CLINICAL DATA:  The patient awoke at 9:30 earlier today with Slurred speech and RIGHT facial  droop. History of breast cancer. EXAM: CT ANGIOGRAPHY HEAD AND NECK TECHNIQUE: Multidetector CT imaging of the head and neck was performed using the standard protocol during bolus administration of intravenous contrast. Multiplanar CT image reconstructions and MIPs were obtained to evaluate the vascular anatomy. Carotid stenosis measurements (when applicable) are obtained utilizing NASCET criteria, using the distal internal carotid diameter as the denominator. CONTRAST:  Isovue 370, 100 mL. COMPARISON:  CT chest reported separately. MR head 01/05/2013. FINDINGS: CT HEAD Calvarium and skull base: No fracture or destructive lesion. Mastoids and middle ears are grossly clear. LEFT frontal craniotomy appears uncomplicated. Paranasal sinuses: Imaged portions are clear. Orbits: Negative. Brain: No evidence of acute abnormality, including acute infarct, hemorrhage, hydrocephalus, or mass lesion. LEFT frontal encephalomalacia, previous brain surgery, for unknown indication. Moderate atrophy, not unexpected for age. Extensive white matter disease. CTA NECK Aortic arch: Standard branching. Imaged portion shows no evidence of aneurysm or dissection. No significant stenosis of the major arch vessel origins. Right carotid system: No  significant plaque at the bifurcation. No evidence of dissection, stenosis (50% or greater) or occlusion. Left carotid system: No significant plaque at the bifurcation. No evidence of dissection, stenosis (50% or greater) or occlusion. Vertebral arteries: Codominant. No evidence of dissection, stenosis (50% or greater) or occlusion. Nonvascular soft tissues: No lung apex lesion or pneumothorax. No neck mass. Cervical spondylosis. Airway midline. CTA HEAD Anterior circulation: Dolichoectatic skull base internal carotid arteries with minimal calcification. Supraclinoid ICA segments widely patent. There is no proximal stenosis of the anterior, or middle cerebral arteries. Distal MCA branches widely  patent. No significant stenosis, proximal occlusion, aneurysm, or vascular malformation. Posterior circulation: Codominant distal vertebral arteries. Dolichoectatic and widely patent basilar artery. Proximal posterior cerebral arteries widely patent. No significant stenosis, proximal occlusion, aneurysm, or vascular malformation. Venous sinuses: As permitted by contrast timing, patent. Anatomic variants: None of significance. Delayed phase:   No abnormal intracranial enhancement. Review of the MIP images confirms the above findings IMPRESSION: No intracranial or extracranial stenosis or dissection. No evidence for acute vascular occlusion. Atrophy and small vessel disease. Sequelae of remote LEFT frontal craniotomy and LEFT frontal encephalomalacia. No intracranial metastatic disease is evident. Electronically Signed   By: Staci Righter M.D.   On: 02/18/2017 15:08   Mr Brain Wo Contrast  Result Date: 02/18/2017 CLINICAL DATA:  Initial evaluation for acute aphasia, facial droop, concern for ischemia. EXAM: MRI HEAD WITHOUT CONTRAST TECHNIQUE: Multiplanar, multiecho pulse sequences of the brain and surrounding structures were obtained without intravenous contrast. COMPARISON:  Prior CT from earlier the same day. FINDINGS: Brain: Study degraded by motion artifact. Diffuse prominence of the CSF containing spaces is compatible with generalized cerebral atrophy. Patchy and confluent T2/FLAIR hyperintensity throughout the periventricular and deep white matter both cerebral hemispheres most consistent with chronic microvascular ischemic disease, fairly advanced in nature. Chronic microvascular ischemic changes present within the pons as well. Superimposed scatter remote lacunar infarcts present within the bilateral thalami. Remote right paramedian pontine lacunar infarct noted. A focus of cortical encephalomalacia of present at the anterior left frontal lobe consistent with remote infarct as well. Approximate 1 cm focus  of restricted diffusion within the left lentiform nucleus, consistent with a small acute ischemic lacunar type infarction (series 100, image 28). No associated mass effect or hemorrhage. Multiple scattered chronic micro hemorrhages seen involving the bilateral basal ganglia, thalami, and cerebellum, consistent with chronic hypertension/hypertensive encephalopathy. No mass lesion, midline shift or mass effect. Mild diffuse ventricular prominence related to global parenchymal volume loss of hydrocephalus. No extra-axial fluid collection. Major dural sinuses are patent. Pituitary gland suprasellar region within normal limits. Midline structures intact and normal. Vascular: Major intracranial vascular flow voids are maintained. Skull and upper cervical spine: Craniocervical junction within normal limits. Visualized upper cervical spine unremarkable. Post craniotomy changes noted at the left frontal calvarium. No acute scalp soft tissue abnormality. Sinuses/Orbits: Globes normal soft tissues within normal limits. Patient status post lens extraction bilaterally. Paranasal sinuses and mastoids are clear. Inner ear structures normal. Other: None. IMPRESSION: 1. 1 cm acute ischemic nonhemorrhagic lacunar type infarct involving the left lentiform nucleus. 2. Moderate cerebral atrophy with advanced chronic microvascular ischemic disease with multiple scattered remote infarcts as above. 3. Extensive chronic micro hemorrhages involving predominantly the thalami and cerebellum, most consistent with chronic underlying hypertension. Electronically Signed   By: Jeannine Boga M.D.   On: 02/18/2017 17:35   Ct Angio Chest Aorta W And/or Wo Contrast  Result Date: 02/18/2017 CLINICAL DATA:  Slurred speech, RIGHT facial droop,  concern for dissection of the aorta. EXAM: CT ANGIOGRAPHY CHEST WITH CONTRAST TECHNIQUE: Multidetector CT imaging of the chest was performed using the standard protocol during bolus administration of  intravenous contrast. Multiplanar CT image reconstructions and MIPs were obtained to evaluate the vascular anatomy. CONTRAST:  Isovue 370, 100 mL. COMPARISON:  CT simulation chest from 05/06/2012. CTA head neck reported separately. FINDINGS: Cardiovascular: Preferential opacification of the thoracic aorta. No evidence of thoracic aortic aneurysm or dissection. Mild calcified atheromatous change along the transverse arch. Normal heart size. No pericardial effusion. Posterior wall plaque decreased in a descending aorta without significant luminal narrowing. Mediastinum/Nodes: No enlarged mediastinal, hilar, or axillary lymph nodes. Thyroid gland, trachea, and esophagus demonstrate no significant findings. Lungs/Pleura: There is a vague area of architectural distortion, posterior peripheral medial RIGHT lower lobe, image 37 series 5 which could be identified on the previous CT Sim exam and is stable. No pulmonary nodules seen. There is no significant pleural effusion. Upper Abdomen: No acute abnormality. Musculoskeletal: No chest wall abnormality. No acute or significant osseous findings. Postsurgical changes, LEFT breast. Review of the MIP images confirms the above findings. IMPRESSION: No evidence for aortic dissection or other acute  chest pathology. Electronically Signed   By: Staci Righter M.D.   On: 02/18/2017 14:51    Cardiac Studies   none  Patient Profile     83 y.o. female with past medical history of hypertension, hyperlipidemia, breast cancer, COPD and meningioma who presented with facial droop and slurred speech. EKG showed inferior ST elevation myocardial infarction with elevated troponin.  Assessment & Plan    1. Late presenting inferior ST elevation myocardial infarction: I suspect that the onset was on Monday. Given her acute stroke, I elected to discontinue heparin given that her myocardial infarction was likely more than 48 hours ago. Continue low-dose aspirin and a statin. Blood  pressure is somewhat on the low side and thus I elected not to start a beta blocker or an ACE inhibitor. An echocardiogram is pending. Given her age and acute stroke, we will likely treat her medically with consideration of cardiac catheterization in the future if she has anginal symptoms.  2. Acute stroke: Confirmed by MRI. Continue medical therapy. Neurology consult is pending.  3. Hyperlipidemia: I elected to switch pravastatin to atorvastatin 40 mg daily.  I discussed the case with the patient's family who were at the bedside.  Signed, Kathlyn Sacramento, MD  02/19/2017, 8:24 AM

## 2017-02-19 NOTE — Evaluation (Addendum)
Clinical/Bedside Swallow Evaluation Patient Details  Name: Bianca Taylor MRN: [id number] Date of Birth: 1930/06/04  Today's Date: 02/19/2017 Time: SLP Start Time (ACUTE ONLY): 1315 SLP Stop Time (ACUTE ONLY): 1415 SLP Time Calculation (min) (ACUTE ONLY): 60 min  Past Medical History:  Past Medical History:  Diagnosis Date  . Breast cancer ([place])    left 2013  . COPD (chronic obstructive pulmonary disease) ([place])   . GERD (gastroesophageal reflux disease)   . Hypercholesterolemia   . Hypertension    Past Surgical History:  Past Surgical History:  Procedure Laterality Date  . BRAIN SURGERY    . BREAST BIOPSY Left    positive 03/2012  . BREAST BIOPSY Bilateral    negative 2000  . BREAST EXCISIONAL BIOPSY Left    positive 04/2012  . BREAST MAMMOSITE Left   . CATARACT EXTRACTION     HPI:   Pt is a 83 y.o. female with a known history of Hypertension, breast cancer presents to the emergency room with dysarthria since waking up today morning. Husband also noticed left facial droop. Last known well time is 10:30 PM. No other focal weakness or numbness. A code stroke was called. CT scan showed no acute stroke. An EKG was obtained which showed inferior wall ST elevation MI. Patient has been evaluated by cardiology at this time. She did have some vomiting and nausea and chest pain 2 days back with is thought to be be even bleeding to the MI. Presently patient is being admitted to ICU for medical management and neurology evaluation. Will need cardiac catheterization as per cardiology recommendations. Pt does present w/ overt dysarthria but is conversive and appropriate w/ her responses answering questions and following instructions. Husband present.   Assessment / Plan / Recommendation Clinical Impression  Pt appears at mild-moderate risk for aspiration at this time; mild-moderate oropharyngeal phase dysphagia noted during this evaluation today. Pt also exhibits moderate dysarthria w/ Right  labial/lingual asymmetry. Pt consumed po trials of thin liquids, purees and soft solids. Overt coughing noted x1 w/ trials of thin liquids via cup/straw; no further coughing noted when pt used strategies of smaller, single sips VIA CUP. No anterior spillage noted w/ liquid trials. During bolus management, noted pt exhibited increased mastication time/effort w/ increased textured foods - tended to chew more on her Left side; slower A-P transit time to fully clear. Noted min buccal residue on Right side; educated pt/Husband on using lingual and finger sweeping to fully clear mouth of any bolus residue during/post meals. Due to the decreased lingual strength and ROM, recommend a dysphagia level 3 diet (w/ even level 2 meats?) moistened well; Thin liquids w/ aspiration precautions and monitoring for overt s/s of aspiration. Recommend Pills whole in puree/ice cream for better bolus management(vs mixed consistency of pills/liquid). Recommend f/u w/ objective swallow study if indicated next 1-3 days; post discharge if indicated. Due to the lingual weakness impacting pt's Speech, recommend f/u w/ Dysarthria evaluation/tx. NSG updated.  SLP Visit Diagnosis: Dysphagia, oropharyngeal phase (R13.12)    Aspiration Risk  Mild aspiration risk;Moderate aspiration risk    Diet Recommendation  Dysphagia level 3(mech soft) w/ moistened foods; Thin liquids.  General aspiration precautions.  Medication Administration: Whole meds with puree    Other  Recommendations Recommended Consults:  (Dietician f/u) Oral Care Recommendations: Oral care BID;Patient independent with oral care;Staff/trained caregiver to provide oral care   Follow up Recommendations Home health SLP;Inpatient Rehab;Skilled Nursing facility      Frequency and Duration min  3x week  2 weeks       Prognosis Prognosis for Safe Diet Advancement: Good      Swallow Study   General Date of Onset: 02/18/17 Type of Study: Bedside Swallow  Evaluation Previous Swallow Assessment: none  Diet Prior to this Study: Regular;Thin liquids (at home; some difficulty now w/ solids) Temperature Spikes Noted: No (wbc 9.4) Respiratory Status: Room air History of Recent Intubation: No Behavior/Cognition: Alert;Cooperative;Pleasant mood Oral Cavity Assessment:  (noted biting of tongue x2 places) Oral Care Completed by SLP: Recent completion by staff Oral Cavity - Dentition: Adequate natural dentition Vision: Functional for self-feeding Self-Feeding Abilities: Able to feed self;Needs assist;Needs set up Patient Positioning: Upright in bed Baseline Vocal Quality: Low vocal intensity (wfl) Volitional Cough: Strong Volitional Swallow: Able to elicit    Oral/Motor/Sensory Function Overall Oral Motor/Sensory Function: Mild impairment (-Moderate impairment on Right side) Facial ROM: Reduced right Facial Symmetry: Abnormal symmetry right Facial Strength: Reduced right Facial Sensation: Reduced right Lingual ROM: Reduced right Lingual Symmetry: Abnormal symmetry right Lingual Strength: Reduced (moreso during speech) Lingual Sensation: Reduced Velum: Within Functional Limits Mandible: Within Functional Limits   Ice Chips Ice chips: Within functional limits Presentation: Spoon (fed; 2 trials)   Thin Liquid Thin Liquid: Impaired Presentation: Cup;Self Fed;Straw (2-3 trials via cup; 4 trials via straw) Oral Phase Impairments:  (wfl) Oral Phase Functional Implications:  (wfl) Pharyngeal  Phase Impairments: Cough - Delayed;Throat Clearing - Delayed (x1 each) Other Comments: pt stated she took too large a sip 1x; noted she held the bolus in preparation for initiating the swallow(?) 1x then coughed    Nectar Thick Nectar Thick Liquid: Not tested   Honey Thick Honey Thick Liquid: Not tested   Puree Puree: Within functional limits Presentation: Self Fed;Spoon (assisted also by husband; 4 ozs)   Solid   GO   Solid: Impaired Presentation:  Self Fed (1 trial sandwich) Oral Phase Impairments: Impaired mastication Oral Phase Functional Implications: Prolonged oral transit;Impaired mastication;Right lateral sulci pocketing Pharyngeal Phase Impairments:  (none) Other Comments: did not eat the rest         Orinda Kenner, MS, CCC-SLP Watson,Katherine 02/19/2017,2:54 PM

## 2017-02-19 NOTE — Progress Notes (Signed)
ANTICOAGULATION CONSULT NOTE - Initial Consult  Pharmacy Consult for heparin drip  Indication: chest pain/ACS  Allergies  Allergen Reactions  . Codeine     Other reaction(s): Dizziness  . Fluocinolone Nausea And Vomiting  . Moxifloxacin     Other reaction(s): Vomiting  . Pravastatin     Other reaction(s): Muscle Pain  . Risedronate Other (See Comments)    Bone pain  . Tetracyclines & Related Nausea And Vomiting  . Niacin Rash    Patient Measurements: Height: 5\' 1"  (154.9 cm) Weight: 125 lb (56.7 kg) IBW/kg (Calculated) : 47.8  Vital Signs: Temp: 98.2 F (36.8 C) (05/31 0000) Temp Source: Oral (05/31 0000) BP: 108/76 (05/31 0100) Pulse Rate: 88 (05/31 0100)  Labs:  Recent Labs  02/18/17 1359 02/18/17 2001 02/18/17 2358  HGB 14.7  --   --   HCT 44.9  --   --   PLT 421  --   --   APTT 30  --   --   LABPROT 13.8  --   --   INR 1.06  --   --   HEPARINUNFRC  --   --  0.11*  CREATININE 0.82  --   --   TROPONINI 15.07* 13.38*  --     Estimated Creatinine Clearance: 38.5 mL/min (by C-G formula based on SCr of 0.82 mg/dL).   Medical History: Past Medical History:  Diagnosis Date  . Breast cancer ([place])    left 2013  . COPD (chronic obstructive pulmonary disease) ([place])   . GERD (gastroesophageal reflux disease)   . Hypercholesterolemia   . Hypertension    Assessment: Pharmacy consulted for heparin dosing and monitoring in 83 yo female with ACS/STEMI.  Troponin I: 15.07  Goal of Therapy:  Heparin level 0.3-0.7 units/ml Monitor platelets by anticoagulation protocol: Yes   Plan:  Baseline labs ordered Give 3000 units bolus x 1 Start heparin infusion at 650 units/hr Check anti-Xa level in 8 hours and daily while on heparin Continue to monitor H&H and platelets   5/31 00:00 heparin level 0.11. 1700 unit bolus and increase rate to 850 units/hr. Recheck in 8 hours.  Eloise Harman, PharmD, BCPS Clinical Pharmacist 02/19/2017 2:05 AM

## 2017-02-19 NOTE — Progress Notes (Addendum)
[place] [facility] [place] NAME: Bianca Taylor    MR#:  [id number]  DATE OF BIRTH:  1930-01-05  SUBJECTIVE:  CHIEF COMPLAINT:  Patient denies any chest pain having dysarthria. Family members at bedside  REVIEW OF SYSTEMS:  CONSTITUTIONAL: No fever, fatigue or weakness.  EYES: No blurred or double vision.  EARS, NOSE, AND THROAT: No tinnitus or ear pain.  RESPIRATORY: No cough, shortness of breath, wheezing or hemoptysis.  CARDIOVASCULAR: No chest pain, orthopnea, edema.  GASTROINTESTINAL: No nausea, vomiting, diarrhea or abdominal pain.  GENITOURINARY: No dysuria, hematuria.  ENDOCRINE: No polyuria, nocturia,  HEMATOLOGY: No anemia, easy bruising or bleeding SKIN: No rash or lesion. MUSCULOSKELETAL: No joint pain or arthritis.   NEUROLOGIC: Speech difficulty No tingling, numbness, weakness.  PSYCHIATRY: No anxiety or depression.   DRUG ALLERGIES:   Allergies  Allergen Reactions  . Codeine     Other reaction(s): Dizziness  . Fluocinolone Nausea And Vomiting  . Moxifloxacin     Other reaction(s): Vomiting  . Pravastatin     Other reaction(s): Muscle Pain  . Risedronate Other (See Comments)    Bone pain  . Tetracyclines & Related Nausea And Vomiting  . Niacin Rash    VITALS:  Blood pressure 121/75, pulse 88, temperature 98.2 F (36.8 C), resp. rate 17, height 5\' 1"  (1.549 m), weight 52 kg (114 lb 10.2 oz), SpO2 95 %.  PHYSICAL EXAMINATION:  GENERAL:  83 y.o.-year-old patient lying in the bed with no acute distress.  EYES: Pupils equal, round, reactive to light and accommodation. No scleral icterus. Extraocular muscles intact.  HEENT: Head atraumatic, normocephalic. Oropharynx and nasopharynx clear.  NECK:  Supple, no jugular venous distention. No thyroid enlargement, no tenderness.  LUNGS: Normal breath sounds bilaterally, no wheezing, rales,rhonchi or crepitation. No use of accessory muscles of respiration.  CARDIOVASCULAR:  S1, S2 normal. No murmurs, rubs, or gallops.  ABDOMEN: Soft, nontender, nondistended. Bowel sounds present. No organomegaly or mass.  EXTREMITIES: No pedal edema, cyanosis, or clubbing.  NEUROLOGIC: Cranial nerves II through XII are intact. Muscle strength 5/5 in all extremities. Sensation intact. Gait not checked.  angle of mouth deviated PSYCHIATRIC: The patient is alert and oriented x 3.  SKIN: No obvious rash, lesion, or ulcer.    LABORATORY PANEL:   CBC  Recent Labs Lab 02/19/17 0200  WBC 9.4  HGB 13.6  HCT 40.8  PLT 382   ------------------------------------------------------------------------------------------------------------------  Chemistries   Recent Labs Lab 02/18/17 1359 02/19/17 0200  NA 136 138  K 3.8 3.6  CL 101 103  CO2 28 28  GLUCOSE 135* 106*  BUN 13 14  CREATININE 0.82 0.67  CALCIUM 9.2 8.8*  AST 87*  --   ALT 23  --   ALKPHOS 79  --   BILITOT 0.6  --    ------------------------------------------------------------------------------------------------------------------  Cardiac Enzymes  Recent Labs Lab 02/19/17 0200  TROPONINI 10.95*   ------------------------------------------------------------------------------------------------------------------  RADIOLOGY:  Ct Angio Head W Or Wo Contrast  Result Date: 02/18/2017 CLINICAL DATA:  The patient awoke at 9:30 earlier today with Slurred speech and RIGHT facial droop. History of breast cancer. EXAM: CT ANGIOGRAPHY HEAD AND NECK TECHNIQUE: Multidetector CT imaging of the head and neck was performed using the standard protocol during bolus administration of intravenous contrast. Multiplanar CT image reconstructions and MIPs were obtained to evaluate the vascular anatomy. Carotid stenosis measurements (when applicable) are obtained utilizing NASCET criteria, using the distal internal carotid diameter as the denominator.  CONTRAST:  Isovue 370, 100 mL. COMPARISON:  CT chest reported separately. MR head  01/05/2013. FINDINGS: CT HEAD Calvarium and skull base: No fracture or destructive lesion. Mastoids and middle ears are grossly clear. LEFT frontal craniotomy appears uncomplicated. Paranasal sinuses: Imaged portions are clear. Orbits: Negative. Brain: No evidence of acute abnormality, including acute infarct, hemorrhage, hydrocephalus, or mass lesion. LEFT frontal encephalomalacia, previous brain surgery, for unknown indication. Moderate atrophy, not unexpected for age. Extensive white matter disease. CTA NECK Aortic arch: Standard branching. Imaged portion shows no evidence of aneurysm or dissection. No significant stenosis of the major arch vessel origins. Right carotid system: No significant plaque at the bifurcation. No evidence of dissection, stenosis (50% or greater) or occlusion. Left carotid system: No significant plaque at the bifurcation. No evidence of dissection, stenosis (50% or greater) or occlusion. Vertebral arteries: Codominant. No evidence of dissection, stenosis (50% or greater) or occlusion. Nonvascular soft tissues: No lung apex lesion or pneumothorax. No neck mass. Cervical spondylosis. Airway midline. CTA HEAD Anterior circulation: Dolichoectatic skull base internal carotid arteries with minimal calcification. Supraclinoid ICA segments widely patent. There is no proximal stenosis of the anterior, or middle cerebral arteries. Distal MCA branches widely patent. No significant stenosis, proximal occlusion, aneurysm, or vascular malformation. Posterior circulation: Codominant distal vertebral arteries. Dolichoectatic and widely patent basilar artery. Proximal posterior cerebral arteries widely patent. No significant stenosis, proximal occlusion, aneurysm, or vascular malformation. Venous sinuses: As permitted by contrast timing, patent. Anatomic variants: None of significance. Delayed phase:   No abnormal intracranial enhancement. Review of the MIP images confirms the above findings IMPRESSION:  No intracranial or extracranial stenosis or dissection. No evidence for acute vascular occlusion. Atrophy and small vessel disease. Sequelae of remote LEFT frontal craniotomy and LEFT frontal encephalomalacia. No intracranial metastatic disease is evident. Electronically Signed   By: Staci Righter M.D.   On: 02/18/2017 15:08   Ct Angio Neck W And/or Wo Contrast  Result Date: 02/18/2017 CLINICAL DATA:  The patient awoke at 9:30 earlier today with Slurred speech and RIGHT facial droop. History of breast cancer. EXAM: CT ANGIOGRAPHY HEAD AND NECK TECHNIQUE: Multidetector CT imaging of the head and neck was performed using the standard protocol during bolus administration of intravenous contrast. Multiplanar CT image reconstructions and MIPs were obtained to evaluate the vascular anatomy. Carotid stenosis measurements (when applicable) are obtained utilizing NASCET criteria, using the distal internal carotid diameter as the denominator. CONTRAST:  Isovue 370, 100 mL. COMPARISON:  CT chest reported separately. MR head 01/05/2013. FINDINGS: CT HEAD Calvarium and skull base: No fracture or destructive lesion. Mastoids and middle ears are grossly clear. LEFT frontal craniotomy appears uncomplicated. Paranasal sinuses: Imaged portions are clear. Orbits: Negative. Brain: No evidence of acute abnormality, including acute infarct, hemorrhage, hydrocephalus, or mass lesion. LEFT frontal encephalomalacia, previous brain surgery, for unknown indication. Moderate atrophy, not unexpected for age. Extensive white matter disease. CTA NECK Aortic arch: Standard branching. Imaged portion shows no evidence of aneurysm or dissection. No significant stenosis of the major arch vessel origins. Right carotid system: No significant plaque at the bifurcation. No evidence of dissection, stenosis (50% or greater) or occlusion. Left carotid system: No significant plaque at the bifurcation. No evidence of dissection, stenosis (50% or greater)  or occlusion. Vertebral arteries: Codominant. No evidence of dissection, stenosis (50% or greater) or occlusion. Nonvascular soft tissues: No lung apex lesion or pneumothorax. No neck mass. Cervical spondylosis. Airway midline. CTA HEAD Anterior circulation: Dolichoectatic skull base internal carotid arteries with minimal  calcification. Supraclinoid ICA segments widely patent. There is no proximal stenosis of the anterior, or middle cerebral arteries. Distal MCA branches widely patent. No significant stenosis, proximal occlusion, aneurysm, or vascular malformation. Posterior circulation: Codominant distal vertebral arteries. Dolichoectatic and widely patent basilar artery. Proximal posterior cerebral arteries widely patent. No significant stenosis, proximal occlusion, aneurysm, or vascular malformation. Venous sinuses: As permitted by contrast timing, patent. Anatomic variants: None of significance. Delayed phase:   No abnormal intracranial enhancement. Review of the MIP images confirms the above findings IMPRESSION: No intracranial or extracranial stenosis or dissection. No evidence for acute vascular occlusion. Atrophy and small vessel disease. Sequelae of remote LEFT frontal craniotomy and LEFT frontal encephalomalacia. No intracranial metastatic disease is evident. Electronically Signed   By: Staci Righter M.D.   On: 02/18/2017 15:08   Mr Brain Wo Contrast  Result Date: 02/18/2017 CLINICAL DATA:  Initial evaluation for acute aphasia, facial droop, concern for ischemia. EXAM: MRI HEAD WITHOUT CONTRAST TECHNIQUE: Multiplanar, multiecho pulse sequences of the brain and surrounding structures were obtained without intravenous contrast. COMPARISON:  Prior CT from earlier the same day. FINDINGS: Brain: Study degraded by motion artifact. Diffuse prominence of the CSF containing spaces is compatible with generalized cerebral atrophy. Patchy and confluent T2/FLAIR hyperintensity throughout the periventricular and  deep white matter both cerebral hemispheres most consistent with chronic microvascular ischemic disease, fairly advanced in nature. Chronic microvascular ischemic changes present within the pons as well. Superimposed scatter remote lacunar infarcts present within the bilateral thalami. Remote right paramedian pontine lacunar infarct noted. A focus of cortical encephalomalacia of present at the anterior left frontal lobe consistent with remote infarct as well. Approximate 1 cm focus of restricted diffusion within the left lentiform nucleus, consistent with a small acute ischemic lacunar type infarction (series 100, image 28). No associated mass effect or hemorrhage. Multiple scattered chronic micro hemorrhages seen involving the bilateral basal ganglia, thalami, and cerebellum, consistent with chronic hypertension/hypertensive encephalopathy. No mass lesion, midline shift or mass effect. Mild diffuse ventricular prominence related to global parenchymal volume loss of hydrocephalus. No extra-axial fluid collection. Major dural sinuses are patent. Pituitary gland suprasellar region within normal limits. Midline structures intact and normal. Vascular: Major intracranial vascular flow voids are maintained. Skull and upper cervical spine: Craniocervical junction within normal limits. Visualized upper cervical spine unremarkable. Post craniotomy changes noted at the left frontal calvarium. No acute scalp soft tissue abnormality. Sinuses/Orbits: Globes normal soft tissues within normal limits. Patient status post lens extraction bilaterally. Paranasal sinuses and mastoids are clear. Inner ear structures normal. Other: None. IMPRESSION: 1. 1 cm acute ischemic nonhemorrhagic lacunar type infarct involving the left lentiform nucleus. 2. Moderate cerebral atrophy with advanced chronic microvascular ischemic disease with multiple scattered remote infarcts as above. 3. Extensive chronic micro hemorrhages involving predominantly  the thalami and cerebellum, most consistent with chronic underlying hypertension. Electronically Signed   By: Jeannine Boga M.D.   On: 02/18/2017 17:35   Ct Angio Chest Aorta W And/or Wo Contrast  Result Date: 02/18/2017 CLINICAL DATA:  Slurred speech, RIGHT facial droop, concern for dissection of the aorta. EXAM: CT ANGIOGRAPHY CHEST WITH CONTRAST TECHNIQUE: Multidetector CT imaging of the chest was performed using the standard protocol during bolus administration of intravenous contrast. Multiplanar CT image reconstructions and MIPs were obtained to evaluate the vascular anatomy. CONTRAST:  Isovue 370, 100 mL. COMPARISON:  CT simulation chest from 05/06/2012. CTA head neck reported separately. FINDINGS: Cardiovascular: Preferential opacification of the thoracic aorta. No evidence of thoracic aortic  aneurysm or dissection. Mild calcified atheromatous change along the transverse arch. Normal heart size. No pericardial effusion. Posterior wall plaque decreased in a descending aorta without significant luminal narrowing. Mediastinum/Nodes: No enlarged mediastinal, hilar, or axillary lymph nodes. Thyroid gland, trachea, and esophagus demonstrate no significant findings. Lungs/Pleura: There is a vague area of architectural distortion, posterior peripheral medial RIGHT lower lobe, image 37 series 5 which could be identified on the previous CT Sim exam and is stable. No pulmonary nodules seen. There is no significant pleural effusion. Upper Abdomen: No acute abnormality. Musculoskeletal: No chest wall abnormality. No acute or significant osseous findings. Postsurgical changes, LEFT breast. Review of the MIP images confirms the above findings. IMPRESSION: No evidence for aortic dissection or other acute  chest pathology. Electronically Signed   By: Staci Righter M.D.   On: 02/18/2017 14:51    EKG:   Orders placed or performed during the hospital encounter of 02/18/17  . ED EKG  . ED EKG    ASSESSMENT  AND PLAN:   * ST elevation MI, inferior  Admit to ICU. Cardiology has evaluated the patient. At this time she will be on aspirin, statin, heparin drip. Telemetry monitoring.Heparin drip is discontinued as her MI was likely more than 48 hours ago. Recommending to continue baby aspirin and a statin Follow-up with cardiology, recommending to treat her medically with consideration of cardiac catheterization in the future if she has anginal symptoms.  * Acute CVA with expressive aphasia - MRI of the brainWith acute left lentiform nucleus infarct  Carotid dopplers - Echo with left ventricular ejection fraction 45-50%, left ventricular hypertrophy - neurology is recommending to start the patient on Plavix 75 mg once daily and continue statin - Check hemoglobin A1c  -fasting lipid panel -LDL -99 - Lovenox for DVT prophylaxis. - PT/OT/Speech consult as needed per symptoms - Neuro checks every 4 hours for 24 hours. - Hold blood pressure medications for permissive hypertension  * Hypertension. Hold medications.  * DVT prophylaxis. D/ced  heparin drip. lovenox sq    All the records are reviewed and case discussed with Care Management/Social Workerr. Management plans discussed with the patient, family and they are in agreement.  CODE STATUS: dnr  TOTAL TIME TAKING CARE OF THIS PATIENT: 34  minutes.   POSSIBLE D/C IN 2  DAYS, DEPENDING ON CLINICAL CONDITION.  Note: This dictation was prepared with Dragon dictation along with smaller phrase technology. Any transcriptional errors that result from this process are unintentional.   Nicholes Mango M.D on 02/19/2017 at 4:06 PM  Between 7am to 6pm - Pager - [phone] After 6pm go to [email or web address] - password [email or web address] [facility]  [facility]  Office  [phone]  CC: Primary care physician; [name], MD

## 2017-02-19 NOTE — Consult Note (Signed)
Referring Physician: Gouru    Chief Complaint: Right facial droop and dysarthria  HPI: Bianca Taylor is an 83 y.o. female with a known history of hypertension and hyperlipidemia who presents to the emergency room with dysarthria since waking up yesterday morning. Husband also noticed right facial droop.  No other focal weakness or numbness. A code stroke was called in the ED.  Initial NIHSS of 2.     Date last known well: Date: 02/17/2017 Time last known well: Time: 22:30 tPA Given: No: Outside time window  Past Medical History:  Diagnosis Date  . Breast cancer ([place])    left 2013  . COPD (chronic obstructive pulmonary disease) ([place])   . GERD (gastroesophageal reflux disease)   . Hypercholesterolemia   . Hypertension     Past Surgical History:  Procedure Laterality Date  . BRAIN SURGERY    . BREAST BIOPSY Left    positive 03/2012  . BREAST BIOPSY Bilateral    negative 2000  . BREAST EXCISIONAL BIOPSY Left    positive 04/2012  . BREAST MAMMOSITE Left   . CATARACT EXTRACTION      Family History  Problem Relation Age of Onset  . Breast cancer Sister 68   Social History:  reports that she has never smoked. She has never used smokeless tobacco. She reports that she does not drink alcohol. Her drug history is not on file.  Allergies:  Allergies  Allergen Reactions  . Codeine     Other reaction(s): Dizziness  . Fluocinolone Nausea And Vomiting  . Moxifloxacin     Other reaction(s): Vomiting  . Pravastatin     Other reaction(s): Muscle Pain  . Risedronate Other (See Comments)    Bone pain  . Tetracyclines & Related Nausea And Vomiting  . Niacin Rash    Medications:  I have reviewed the patient's current medications. Prior to Admission:  Prescriptions Prior to Admission  Medication Sig Dispense Refill Last Dose  . aspirin EC 81 MG tablet Take 81 mg by mouth at bedtime.    02/17/2017 at 2000  . butalbital-acetaminophen-caffeine (FIORICET, ESGIC) 50-325-40 MG  tablet Take 1 tablet by mouth every 4 (four) hours as needed for headache.    PRN at PRN  . cyclobenzaprine (FLEXERIL) 10 MG tablet Take 5-10 mg by mouth 2 (two) times daily as needed for muscle spasms.   PRN at PRN  . pravastatin (PRAVACHOL) 40 MG tablet Take 40 mg by mouth at bedtime.   02/17/2017 at pm  . tamoxifen (NOLVADEX) 20 MG tablet Take 20 mg by mouth at bedtime.    02/17/2017 at pm  . telmisartan (MICARDIS) 80 MG tablet Take 80 mg by mouth at bedtime.   02/17/2017 at pm  . traMADol (ULTRAM) 50 MG tablet Take 1 tablet (50 mg total) by mouth every 6 (six) hours as needed for moderate pain. 20 tablet 0 PRN at PRN  . zolpidem (AMBIEN) 5 MG tablet Take 5 mg by mouth at bedtime as needed for sleep.   PRN at PRN   Scheduled: . aspirin EC  81 mg Oral QHS  . atorvastatin  40 mg Oral q1800  . sodium chloride flush  3 mL Intravenous Q12H  . tamoxifen  20 mg Oral QHS    ROS: History obtained from the patient  General ROS: negative for - chills, fatigue, fever, night sweats, weight gain or weight loss Psychological ROS: negative for - behavioral disorder, hallucinations, memory difficulties, mood swings or suicidal ideation Ophthalmic ROS:  negative for - blurry vision, double vision, eye pain or loss of vision ENT ROS: negative for - epistaxis, nasal discharge, oral lesions, sore throat, tinnitus or vertigo Allergy and Immunology ROS: negative for - hives or itchy/watery eyes Hematological and Lymphatic ROS: negative for - bleeding problems, bruising or swollen lymph nodes Endocrine ROS: negative for - galactorrhea, hair pattern changes, polydipsia/polyuria or temperature intolerance Respiratory ROS: negative for - cough, hemoptysis, shortness of breath or wheezing Cardiovascular ROS: chest pain Gastrointestinal ROS: negative for - abdominal pain, diarrhea, hematemesis, nausea/vomiting or stool incontinence Genito-Urinary ROS: negative for - dysuria, hematuria, incontinence or urinary  frequency/urgency Musculoskeletal ROS: negative for - joint swelling or muscular weakness Neurological ROS: as noted in HPI Dermatological ROS: negative for rash and skin lesion changes  Physical Examination: Blood pressure 121/75, pulse 88, temperature 98.2 F (36.8 C), resp. rate 17, height 5\' 1"  (1.549 m), weight 52 kg (114 lb 10.2 oz), SpO2 95 %.  HEENT-  Normocephalic, no lesions, without obvious abnormality.  Normal external eye and conjunctiva.  Normal TM's bilaterally.  Normal auditory canals and external ears. Normal external nose, mucus membranes and septum.  Normal pharynx. Cardiovascular- S1, S2 normal, pulses palpable throughout   Lungs- chest clear, no wheezing, rales, normal symmetric air entry Abdomen- soft, non-tender; bowel sounds normal; no masses,  no organomegaly Extremities- no edema Lymph-no adenopathy palpable Musculoskeletal-no joint tenderness, deformity or swelling Skin-warm and dry, no hyperpigmentation, vitiligo, or suspicious lesions  Neurological Examination   Mental Status: Alert, oriented, thought content appropriate.  Speech fluent without evidence of aphasia.  Dysarthric.  Able to follow 3 step commands without difficulty. Cranial Nerves: II: Discs flat bilaterally; Visual fields grossly normal, pupils equal, round, reactive to light and accommodation III,IV, VI: ptosis not present, extra-ocular motions intact bilaterally V,VII: right facial droop, facial light touch sensation normal bilaterally VIII: hearing normal bilaterally IX,X: gag reflex present XI: bilateral shoulder shrug XII: midline tongue extension Motor: Right : Upper extremity   5/5    Left:     Upper extremity   5/5  Lower extremity   5/5     Lower extremity   5/5 Tone and bulk:normal tone throughout; no atrophy noted Sensory: Pinprick and light touch intact throughout, bilaterally Deep Tendon Reflexes: 2+ and symmetric throughout Plantars: Right: upgoing   Left:  downgoing Cerebellar: Normal finger-to-nose and normal heel-to-shin testing bilaterally Gait: not tested due to safety concerns    Laboratory Studies:  Basic Metabolic Panel:  Recent Labs Lab 02/18/17 1359 02/19/17 0200  NA 136 138  K 3.8 3.6  CL 101 103  CO2 28 28  GLUCOSE 135* 106*  BUN 13 14  CREATININE 0.82 0.67  CALCIUM 9.2 8.8*    Liver Function Tests:  Recent Labs Lab 02/18/17 1359  AST 87*  ALT 23  ALKPHOS 79  BILITOT 0.6  PROT 7.2  ALBUMIN 3.9   No results for input(s): LIPASE, AMYLASE in the last 168 hours. No results for input(s): AMMONIA in the last 168 hours.  CBC:  Recent Labs Lab 02/18/17 1359 02/19/17 0200  WBC 11.1* 9.4  NEUTROABS 8.3*  --   HGB 14.7 13.6  HCT 44.9 40.8  MCV 90.9 91.1  PLT 421 382    Cardiac Enzymes:  Recent Labs Lab 02/18/17 1359 02/18/17 2001 02/19/17 0200  TROPONINI 15.07* 13.38* 10.95*    BNP: Invalid input(s): POCBNP  CBG:  Recent Labs Lab 02/18/17 1409 02/18/17 1943  GLUCAP 128* 105*    Microbiology: Results for  orders placed or performed during the hospital encounter of 02/18/17  MRSA PCR Screening     Status: None   Collection Time: 02/18/17  7:45 PM  Result Value Ref Range Status   MRSA by PCR NEGATIVE NEGATIVE Final    Comment:        The GeneXpert MRSA Assay (FDA approved for NASAL specimens only), is one component of a comprehensive MRSA colonization surveillance program. It is not intended to diagnose MRSA infection nor to guide or monitor treatment for MRSA infections.     Coagulation Studies:  Recent Labs  02/18/17 1359  LABPROT 13.8  INR 1.06    Urinalysis: No results for input(s): COLORURINE, LABSPEC, PHURINE, GLUCOSEU, HGBUR, BILIRUBINUR, KETONESUR, PROTEINUR, UROBILINOGEN, NITRITE, LEUKOCYTESUR in the last 168 hours.  Invalid input(s): APPERANCEUR  Lipid Panel: No results found for: CHOL, TRIG, HDL, CHOLHDL, VLDL, LDLCALC  HgbA1C: No results found for:  HGBA1C  Urine Drug Screen:  No results found for: LABOPIA, COCAINSCRNUR, LABBENZ, AMPHETMU, THCU, LABBARB  Alcohol Level: No results for input(s): ETH in the last 168 hours.  Other results: EKG: sinus tachycardia at 102 bpm, STEMI.  Imaging: Ct Angio Head W Or Wo Contrast  Result Date: 02/18/2017 CLINICAL DATA:  The patient awoke at 9:30 earlier today with Slurred speech and RIGHT facial droop. History of breast cancer. EXAM: CT ANGIOGRAPHY HEAD AND NECK TECHNIQUE: Multidetector CT imaging of the head and neck was performed using the standard protocol during bolus administration of intravenous contrast. Multiplanar CT image reconstructions and MIPs were obtained to evaluate the vascular anatomy. Carotid stenosis measurements (when applicable) are obtained utilizing NASCET criteria, using the distal internal carotid diameter as the denominator. CONTRAST:  Isovue 370, 100 mL. COMPARISON:  CT chest reported separately. MR head 01/05/2013. FINDINGS: CT HEAD Calvarium and skull base: No fracture or destructive lesion. Mastoids and middle ears are grossly clear. LEFT frontal craniotomy appears uncomplicated. Paranasal sinuses: Imaged portions are clear. Orbits: Negative. Brain: No evidence of acute abnormality, including acute infarct, hemorrhage, hydrocephalus, or mass lesion. LEFT frontal encephalomalacia, previous brain surgery, for unknown indication. Moderate atrophy, not unexpected for age. Extensive white matter disease. CTA NECK Aortic arch: Standard branching. Imaged portion shows no evidence of aneurysm or dissection. No significant stenosis of the major arch vessel origins. Right carotid system: No significant plaque at the bifurcation. No evidence of dissection, stenosis (50% or greater) or occlusion. Left carotid system: No significant plaque at the bifurcation. No evidence of dissection, stenosis (50% or greater) or occlusion. Vertebral arteries: Codominant. No evidence of dissection, stenosis  (50% or greater) or occlusion. Nonvascular soft tissues: No lung apex lesion or pneumothorax. No neck mass. Cervical spondylosis. Airway midline. CTA HEAD Anterior circulation: Dolichoectatic skull base internal carotid arteries with minimal calcification. Supraclinoid ICA segments widely patent. There is no proximal stenosis of the anterior, or middle cerebral arteries. Distal MCA branches widely patent. No significant stenosis, proximal occlusion, aneurysm, or vascular malformation. Posterior circulation: Codominant distal vertebral arteries. Dolichoectatic and widely patent basilar artery. Proximal posterior cerebral arteries widely patent. No significant stenosis, proximal occlusion, aneurysm, or vascular malformation. Venous sinuses: As permitted by contrast timing, patent. Anatomic variants: None of significance. Delayed phase:   No abnormal intracranial enhancement. Review of the MIP images confirms the above findings IMPRESSION: No intracranial or extracranial stenosis or dissection. No evidence for acute vascular occlusion. Atrophy and small vessel disease. Sequelae of remote LEFT frontal craniotomy and LEFT frontal encephalomalacia. No intracranial metastatic disease is evident. Electronically Signed  By: Staci Righter M.D.   On: 02/18/2017 15:08   Ct Angio Neck W And/or Wo Contrast  Result Date: 02/18/2017 CLINICAL DATA:  The patient awoke at 9:30 earlier today with Slurred speech and RIGHT facial droop. History of breast cancer. EXAM: CT ANGIOGRAPHY HEAD AND NECK TECHNIQUE: Multidetector CT imaging of the head and neck was performed using the standard protocol during bolus administration of intravenous contrast. Multiplanar CT image reconstructions and MIPs were obtained to evaluate the vascular anatomy. Carotid stenosis measurements (when applicable) are obtained utilizing NASCET criteria, using the distal internal carotid diameter as the denominator. CONTRAST:  Isovue 370, 100 mL. COMPARISON:  CT  chest reported separately. MR head 01/05/2013. FINDINGS: CT HEAD Calvarium and skull base: No fracture or destructive lesion. Mastoids and middle ears are grossly clear. LEFT frontal craniotomy appears uncomplicated. Paranasal sinuses: Imaged portions are clear. Orbits: Negative. Brain: No evidence of acute abnormality, including acute infarct, hemorrhage, hydrocephalus, or mass lesion. LEFT frontal encephalomalacia, previous brain surgery, for unknown indication. Moderate atrophy, not unexpected for age. Extensive white matter disease. CTA NECK Aortic arch: Standard branching. Imaged portion shows no evidence of aneurysm or dissection. No significant stenosis of the major arch vessel origins. Right carotid system: No significant plaque at the bifurcation. No evidence of dissection, stenosis (50% or greater) or occlusion. Left carotid system: No significant plaque at the bifurcation. No evidence of dissection, stenosis (50% or greater) or occlusion. Vertebral arteries: Codominant. No evidence of dissection, stenosis (50% or greater) or occlusion. Nonvascular soft tissues: No lung apex lesion or pneumothorax. No neck mass. Cervical spondylosis. Airway midline. CTA HEAD Anterior circulation: Dolichoectatic skull base internal carotid arteries with minimal calcification. Supraclinoid ICA segments widely patent. There is no proximal stenosis of the anterior, or middle cerebral arteries. Distal MCA branches widely patent. No significant stenosis, proximal occlusion, aneurysm, or vascular malformation. Posterior circulation: Codominant distal vertebral arteries. Dolichoectatic and widely patent basilar artery. Proximal posterior cerebral arteries widely patent. No significant stenosis, proximal occlusion, aneurysm, or vascular malformation. Venous sinuses: As permitted by contrast timing, patent. Anatomic variants: None of significance. Delayed phase:   No abnormal intracranial enhancement. Review of the MIP images  confirms the above findings IMPRESSION: No intracranial or extracranial stenosis or dissection. No evidence for acute vascular occlusion. Atrophy and small vessel disease. Sequelae of remote LEFT frontal craniotomy and LEFT frontal encephalomalacia. No intracranial metastatic disease is evident. Electronically Signed   By: Staci Righter M.D.   On: 02/18/2017 15:08   Mr Brain Wo Contrast  Result Date: 02/18/2017 CLINICAL DATA:  Initial evaluation for acute aphasia, facial droop, concern for ischemia. EXAM: MRI HEAD WITHOUT CONTRAST TECHNIQUE: Multiplanar, multiecho pulse sequences of the brain and surrounding structures were obtained without intravenous contrast. COMPARISON:  Prior CT from earlier the same day. FINDINGS: Brain: Study degraded by motion artifact. Diffuse prominence of the CSF containing spaces is compatible with generalized cerebral atrophy. Patchy and confluent T2/FLAIR hyperintensity throughout the periventricular and deep white matter both cerebral hemispheres most consistent with chronic microvascular ischemic disease, fairly advanced in nature. Chronic microvascular ischemic changes present within the pons as well. Superimposed scatter remote lacunar infarcts present within the bilateral thalami. Remote right paramedian pontine lacunar infarct noted. A focus of cortical encephalomalacia of present at the anterior left frontal lobe consistent with remote infarct as well. Approximate 1 cm focus of restricted diffusion within the left lentiform nucleus, consistent with a small acute ischemic lacunar type infarction (series 100, image 28). No associated mass effect  or hemorrhage. Multiple scattered chronic micro hemorrhages seen involving the bilateral basal ganglia, thalami, and cerebellum, consistent with chronic hypertension/hypertensive encephalopathy. No mass lesion, midline shift or mass effect. Mild diffuse ventricular prominence related to global parenchymal volume loss of hydrocephalus.  No extra-axial fluid collection. Major dural sinuses are patent. Pituitary gland suprasellar region within normal limits. Midline structures intact and normal. Vascular: Major intracranial vascular flow voids are maintained. Skull and upper cervical spine: Craniocervical junction within normal limits. Visualized upper cervical spine unremarkable. Post craniotomy changes noted at the left frontal calvarium. No acute scalp soft tissue abnormality. Sinuses/Orbits: Globes normal soft tissues within normal limits. Patient status post lens extraction bilaterally. Paranasal sinuses and mastoids are clear. Inner ear structures normal. Other: None. IMPRESSION: 1. 1 cm acute ischemic nonhemorrhagic lacunar type infarct involving the left lentiform nucleus. 2. Moderate cerebral atrophy with advanced chronic microvascular ischemic disease with multiple scattered remote infarcts as above. 3. Extensive chronic micro hemorrhages involving predominantly the thalami and cerebellum, most consistent with chronic underlying hypertension. Electronically Signed   By: Jeannine Boga M.D.   On: 02/18/2017 17:35   Ct Angio Chest Aorta W And/or Wo Contrast  Result Date: 02/18/2017 CLINICAL DATA:  Slurred speech, RIGHT facial droop, concern for dissection of the aorta. EXAM: CT ANGIOGRAPHY CHEST WITH CONTRAST TECHNIQUE: Multidetector CT imaging of the chest was performed using the standard protocol during bolus administration of intravenous contrast. Multiplanar CT image reconstructions and MIPs were obtained to evaluate the vascular anatomy. CONTRAST:  Isovue 370, 100 mL. COMPARISON:  CT simulation chest from 05/06/2012. CTA head neck reported separately. FINDINGS: Cardiovascular: Preferential opacification of the thoracic aorta. No evidence of thoracic aortic aneurysm or dissection. Mild calcified atheromatous change along the transverse arch. Normal heart size. No pericardial effusion. Posterior wall plaque decreased in a  descending aorta without significant luminal narrowing. Mediastinum/Nodes: No enlarged mediastinal, hilar, or axillary lymph nodes. Thyroid gland, trachea, and esophagus demonstrate no significant findings. Lungs/Pleura: There is a vague area of architectural distortion, posterior peripheral medial RIGHT lower lobe, image 37 series 5 which could be identified on the previous CT Sim exam and is stable. No pulmonary nodules seen. There is no significant pleural effusion. Upper Abdomen: No acute abnormality. Musculoskeletal: No chest wall abnormality. No acute or significant osseous findings. Postsurgical changes, LEFT breast. Review of the MIP images confirms the above findings. IMPRESSION: No evidence for aortic dissection or other acute  chest pathology. Electronically Signed   By: Staci Righter M.D.   On: 02/18/2017 14:51    Assessment: 83 y.o. female presenting with right facial droop and dysarthria.  MRI of the brain reviewed and shows an acute left lentiform nucleus infarct.  Patient not compliant with her antihypertensives therefore this is likely secondary to small vessel disease.  Is taking ASA daily.  CTA unremarkable.  Echocardiogram shows LVH with EF of 45-50%  Stroke Risk Factors - hyperlipidemia and hypertension  Plan: 1. HgbA1c, fasting lipid panel 2. PT consult, OT consult, Speech consult 3. Prophylactic therapy-Antiplatelet med: Plavix - dose 75mg  daily 4. Telemetry monitoring 5. Frequent neuro checks 6. Medication compliance stressed with patient and family    Alexis Goodell, MD Neurology [phone] 02/19/2017, 2:49 PM

## 2017-02-19 NOTE — Progress Notes (Signed)
*  PRELIMINARY RESULTS* Echocardiogram 2D Echocardiogram has been performed.  Sherrie Sport 02/19/2017, 9:49 AM

## 2017-02-19 NOTE — Plan of Care (Signed)
Problem: Health Behavior/Discharge Planning: Goal: Ability to manage health-related needs will improve Outcome: Completed/Met Date Met: 02/19/17 No changes to medication or self care

## 2017-02-19 NOTE — Progress Notes (Signed)
Patient transferred to room 247 with family at bedside. Report given to [place], Therapist, sports.

## 2017-02-20 LAB — CBC
HCT: 38.2 % (ref 35.0–47.0)
Hemoglobin: 13 g/dL (ref 12.0–16.0)
MCH: 30.5 pg (ref 26.0–34.0)
MCHC: 34 g/dL (ref 32.0–36.0)
MCV: 89.7 fL (ref 80.0–100.0)
PLATELETS: 394 10*3/uL (ref 150–440)
RBC: 4.26 MIL/uL (ref 3.80–5.20)
RDW: 13.4 % (ref 11.5–14.5)
WBC: 9.5 10*3/uL (ref 3.6–11.0)

## 2017-02-20 LAB — BASIC METABOLIC PANEL
ANION GAP: 6 (ref 5–15)
BUN: 14 mg/dL (ref 6–20)
CALCIUM: 8.6 mg/dL — AB (ref 8.9–10.3)
CO2: 28 mmol/L (ref 22–32)
Chloride: 104 mmol/L (ref 101–111)
Creatinine, Ser: 0.72 mg/dL (ref 0.44–1.00)
GFR calc Af Amer: >60 mL/min (ref >60)
GLUCOSE: 106 mg/dL — AB (ref 65–99)
Potassium: 3.8 mmol/L (ref 3.5–5.1)
SODIUM: 138 mmol/L (ref 135–145)

## 2017-02-20 LAB — HEMOGLOBIN A1C
Hgb A1c MFr Bld: 5.8 % — ABNORMAL HIGH (ref 4.8–5.6)
Mean Plasma Glucose: 120 mg/dL

## 2017-02-20 MED ORDER — ACETAMINOPHEN 325 MG PO TABS
650.0000 mg | ORAL_TABLET | Freq: Four times a day (QID) | ORAL | Status: AC | PRN
Start: 2017-02-20 — End: ?

## 2017-02-20 MED ORDER — METOPROLOL TARTRATE 25 MG PO TABS: 12.5000 mg | ORAL_TABLET | Freq: Two times a day (BID) | ORAL | 0 refills | Status: DC

## 2017-02-20 MED ORDER — ATORVASTATIN CALCIUM 40 MG PO TABS: 40.0000 mg | ORAL_TABLET | Freq: Every day | ORAL | 0 refills | Status: DC

## 2017-02-20 MED ORDER — CLOPIDOGREL BISULFATE 75 MG PO TABS: 75.0000 mg | ORAL_TABLET | Freq: Every day | ORAL | 0 refills | Status: DC

## 2017-02-20 MED ORDER — METOPROLOL TARTRATE 25 MG PO TABS
12.5000 mg | ORAL_TABLET | Freq: Two times a day (BID) | ORAL | Status: DC
  Administered 2017-02-20: 12.5 mg via ORAL
  Filled 2017-02-20: qty 1

## 2017-02-20 NOTE — Care Management Note (Signed)
Case Management Note  Patient Details  Name: Rian Koon MRN: [id number] Date of Birth: 04-19-1930  Subjective/Objective:                 Admitted late presenting stemi and cva. concern for non compliance with medications. PT and OT consults pending.  Has been evaluated by SLP.  Potential for discharge today. Agreeable to home health if indicated and no agency preference.  heads up referral to [facility] for SN PT OT SLP   Action/Plan:   Expected Discharge Date:                  Expected Discharge Plan:     In-House Referral:     Discharge planning Services     Post Acute Care Choice:    Choice offered to:     DME Arranged:    DME Agency:     HH Arranged:    [place] Agency:     Status of Service:     If discussed at [employer], dates discussed:    Additional Comments:  Katrina Stack, RN 02/20/2017, 9:12 AM

## 2017-02-20 NOTE — Evaluation (Signed)
Occupational Therapy Evaluation Patient Details Name: Bianca Taylor MRN: [id number] DOB: 08/24/1930 Today's Date: 02/20/2017    History of Present Illness Pt. is an 83 y.o. female who was admitted to [facility] with an acute left lentiform nucleus CVA. Pt. PMHx: includes: breast CA, COPD, and brain surgery.   Clinical Impression   Pt. presents with limited left hand use due to a recent left wrist fracture 6 weeks ago, weakness, and limited mobility. Pt. education was provided about NWB in her left hand. Pt. required assist for mobility. Pt. could benefit from skilled OT services for ADL training, positioning, and pt./family education about home modification, and DME. Pt. Is planning to return home with her husband today. Pt. Cold benefit from follow-up OT home health services.    Follow Up Recommendations  Home health OT    Equipment Recommendations       Recommendations for Other Services       Precautions / Restrictions Precautions Precautions: Fall Restrictions Weight Bearing Restrictions: Yes LUE Weight Bearing: Non weight bearing Other Position/Activity Restrictions: Pt fell and broke her wrist ~6 weeks ago.  Follow up appointment with Dr. Sabra Heck to determine new WB status was scheduled for yesterday so they will reschedule this but for now pt is to continue NWB LUE.             ADL either performed or assessed with clinical judgement   ADL Overall ADL's : Needs assistance/impaired Eating/Feeding: Set up   Grooming: Set up   Upper Body Bathing: Minimal assistance   Lower Body Bathing: Minimal assistance   Upper Body Dressing : Moderate assistance   Lower Body Dressing: Minimal assistance               Functional mobility during ADLs: Minimal assistance General ADL Comments: Pt. education was provided about A/E use for LE ADLs.     Vision Patient Visual Report: No change from baseline       Perception     Praxis      Pertinent Vitals/Pain Pain  Assessment: No/denies pain     Hand Dominance Right   Extremity/Trunk Assessment Upper Extremity Assessment Upper Extremity Assessment: LUE deficits/detail LUE Deficits / Details: L wrist fx, pt reports she has been told she no longer needs to wear splint.   LUE: Unable to fully assess due to pain LUE Sensation:  (WNL) LUE Coordination:  (WNL)   Cervical / Trunk Assessment Cervical / Trunk Assessment: Kyphotic   Communication Communication Communication: Other (comment) (dysarthria)   Cognition Arousal/Alertness: Awake/alert Behavior During Therapy: WFL for tasks assessed/performed Overall Cognitive Status: Within Functional Limits for tasks assessed                                     General Comments  Reviewed the signs and symptoms of a stroke with pt and husband with teachback technique with instruction to call 911 immediately if they notice any of these symptoms.  Pt and husband initially only able to report ~1/2 of the signs and symptoms but verbalize understanding of all signs and symptoms following.    Exercises     Shoulder Instructions      Home Living Family/patient expects to be discharged to:: Private residence Living Arrangements: Spouse/significant other Available Help at Discharge: (P) Family;Available 24 hours/day Type of Home: (P) House Home Access: Stairs to enter [employer] of Steps: 2 Entrance Stairs-Rails: Left Home Layout:  Two level;Bed/bath upstairs Alternate Level Stairs-Number of Steps: flight Alternate Level Stairs-Rails: Left Bathroom Shower/Tub: Walk-in shower   Bathroom Toilet: Handicapped height     Home Equipment: Turrell - single point;Shower seat;Grab bars - tub/shower;Hand held shower head      Lives With: (P) Spouse    Prior Functioning/Environment Level of Independence: Needs assistance  Gait / Transfers Assistance Needed: Pt has been ambulating without AD.  1 fall in the past year which was ~6 wks  ago.   ADL's / Homemaking Assistance Needed: Pt. was independent prior to the wrist fracture. Pt. does not cook, or drive at baseline by choice.   Comments: Pt was Ind without AD prior to fall ~6 weeks ago with resultant L wrist fx.  Due to L wrist fx the pt has been requiring assist with the above mentioned activities.        OT Problem List: Decreased strength;Decreased activity tolerance;Impaired UE functional use;Decreased knowledge of use of DME or AE;Decreased range of motion;Decreased coordination;Decreased safety awareness;Impaired balance (sitting and/or standing)      OT Treatment/Interventions: Self-care/ADL training;Therapeutic exercise;Patient/family education;Therapeutic activities;Energy conservation;DME and/or AE instruction;Neuromuscular education    OT Goals(Current goals can be found in the care plan section) Acute Rehab OT Goals Patient Stated Goal: To return home OT Goal Formulation: With patient Potential to Achieve Goals: Good  OT Frequency: Min 1X/week   Barriers to D/C:            Co-evaluation              AM-PAC PT "6 Clicks" Daily Activity     Outcome Measure Help from another person eating meals?: A Little Help from another person taking care of personal grooming?: A Little Help from another person toileting, which includes using toliet, bedpan, or urinal?: A Lot Help from another person bathing (including washing, rinsing, drying)?: A Little Help from another person to put on and taking off regular upper body clothing?: A Lot Help from another person to put on and taking off regular lower body clothing?: A Little 6 Click Score: 16   End of Session    Activity Tolerance: Patient tolerated treatment well Patient left: in bed;with call bell/phone within reach;with bed alarm set  OT Visit Diagnosis: Muscle weakness (generalized) (M62.81)                Time: 1610-9604 OT Time Calculation (min): 21 min Charges:  OT General Charges $OT Visit:  1 Procedure OT Evaluation $OT Eval Moderate Complexity: 1 Procedure G-Codes:    Harrel Carina, MS, OTR/L Harrel Carina, MS, OTR/L 02/20/2017, 2:41 PM

## 2017-02-20 NOTE — Progress Notes (Signed)
Patient discharged via wheelchair and private vehicle. IV removed and catheter intact. All discharge instructions given and patient verbalizes understanding. Tele removed and returned. No prescriptions given to patient No distress noted.   

## 2017-02-20 NOTE — Discharge Summary (Signed)
[facility] [place] NAME: Bianca Taylor    MR#:  [id number]  DATE OF BIRTH:  [birth year]  DATE OF ADMISSION:  02/18/2017 ADMITTING PHYSICIAN: Hillary Bow, MD  DATE OF DISCHARGE: 02/20/17  PRIMARY CARE PHYSICIAN: [name], MD    ADMISSION DIAGNOSIS:  Aphasia [R47.01] Slurred speech [R47.81] Facial droop [R29.810] CVA (cerebral vascular accident) ([place]) [I63.9] ST elevation myocardial infarction (STEMI), unspecified artery ([place]) [I21.3]  DISCHARGE DIAGNOSIS:   Late presenting inferior ST elevation myocardial infarction Acute CVA and hyperlipidemia  SECONDARY DIAGNOSIS:   Past Medical History:  Diagnosis Date  . Breast cancer ([place])    left 2013  . COPD (chronic obstructive pulmonary disease) ([place])   . GERD (gastroesophageal reflux disease)   . Hypercholesterolemia   . Hypertension     HOSPITAL COURSE:     1. Late presenting inferior ST elevation myocardial infarction: Patient was treated with heparin drip , chest pain free  Given her age and acute stroke cardiology has recommended to treat patient medically with consideration of cardiac catheterization in the future if she has anginal symptoms. She is currently on dual antiplatelet therapy with baby aspirin and clopidogrel 75 mg Continue statin and metoprolol 12.5 mg twice daily. Echocardiogram showed an EF of 45-50%. An ACE inhibitor or ARB can be considered as an outpatient if blood pressure allows.   2.Acute CVA with expressive aphasia - MRI of the brainWith acute left lentiform nucleus infarct - Echo with left ventricular ejection fraction 45-50%, left ventricular hypertrophy - neurology is recommending to start the patient on Plavix 75 mg once daily and continue statin - 5.8 hemoglobin A1c  -fasting lipid panel -LDL -99, on high intensity statin - Lovenox for DVT prophylaxis. - PT/OT/Speech consult-PT recommended home health PT, speech therapy  recommended dysphagia 3 diet - Neuro checks performed every 4 hours for 24 hours. - Hold blood pressure medications for permissive hypertension  3. HTN- bp is low nml, on metoprolol  4. Generalized weakness home health PT  DISCHARGE CONDITIONS:  Stable  CONSULTS OBTAINED:  Treatment Team:  Nelva Bush, MD Alexis Goodell, MD   PROCEDURES None  DRUG ALLERGIES:   Allergies  Allergen Reactions  . Codeine     Other reaction(s): Dizziness  . Fluocinolone Nausea And Vomiting  . Moxifloxacin     Other reaction(s): Vomiting  . Pravastatin     Other reaction(s): Muscle Pain  . Risedronate Other (See Comments)    Bone pain  . Tetracyclines & Related Nausea And Vomiting  . Niacin Rash    DISCHARGE MEDICATIONS:   Current Discharge Medication List    START taking these medications   Details  acetaminophen (TYLENOL) 325 MG tablet Take 2 tablets (650 mg total) by mouth every 6 (six) hours as needed for mild pain (or Fever >/= 101).    atorvastatin (LIPITOR) 40 MG tablet Take 1 tablet (40 mg total) by mouth daily at 6 PM. Qty: 30 tablet, Refills: 0    clopidogrel (PLAVIX) 75 MG tablet Take 1 tablet (75 mg total) by mouth daily. Qty: 30 tablet, Refills: 0    metoprolol tartrate (LOPRESSOR) 25 MG tablet Take 0.5 tablets (12.5 mg total) by mouth 2 (two) times daily. Qty: 60 tablet, Refills: 0      CONTINUE these medications which have NOT CHANGED   Details  aspirin EC 81 MG tablet Take 81 mg by mouth at bedtime.     butalbital-acetaminophen-caffeine (FIORICET, [place]) [phone]  MG tablet Take 1 tablet by mouth every 4 (four) hours as needed for headache.     cyclobenzaprine (FLEXERIL) 10 MG tablet Take 5-10 mg by mouth 2 (two) times daily as needed for muscle spasms.    pravastatin (PRAVACHOL) 40 MG tablet Take 40 mg by mouth at bedtime.    tamoxifen (NOLVADEX) 20 MG tablet Take 20 mg by mouth at bedtime.     telmisartan (MICARDIS) 80 MG tablet Take 80 mg by mouth  at bedtime.    traMADol (ULTRAM) 50 MG tablet Take 1 tablet (50 mg total) by mouth every 6 (six) hours as needed for moderate pain. Qty: 20 tablet, Refills: 0    zolpidem (AMBIEN) 5 MG tablet Take 5 mg by mouth at bedtime as needed for sleep.         DISCHARGE INSTRUCTIONS:   Follow-up with primary care physician in 5 days Follow-up with cardiology Dr. Fletcher Anon in a week Follow-up with neurology in 3-4 weeks  DIET:  Cardiac diet-dysphagia 3  DISCHARGE CONDITION:  Fair  ACTIVITY:  Activity as tolerated per PT  OXYGEN:  Home Oxygen: No.   Oxygen Delivery: room air  DISCHARGE LOCATION:  home   If you experience worsening of your admission symptoms, develop shortness of breath, life threatening emergency, suicidal or homicidal thoughts you must seek medical attention immediately by calling 911 or calling your MD immediately  if symptoms less severe.  You Must read complete instructions/literature along with all the possible adverse reactions/side effects for all the Medicines you take and that have been prescribed to you. Take any new Medicines after you have completely understood and accpet all the possible adverse reactions/side effects.   Please note  You were cared for by a hospitalist during your hospital stay. If you have any questions about your discharge medications or the care you received while you were in the hospital after you are discharged, you can call the unit and asked to speak with the hospitalist on call if the hospitalist that took care of you is not available. Once you are discharged, your primary care physician will handle any further medical issues. Please note that NO REFILLS for any discharge medications will be authorized once you are discharged, as it is imperative that you return to your primary care physician (or establish a relationship with a primary care physician if you do not have one) for your aftercare needs so that they can reassess your need for  medications and monitor your lab values.     Today  Chief Complaint  Patient presents with  . Aphasia    Patient is feeling much better denies any chest pain or shortness of breath. Angle of mouth is less deviated  Speech is much clearer according to husband ROS:  CONSTITUTIONAL: Denies fevers, chills. Denies any fatigue, weakness.  EYES: Denies blurry vision, double vision, eye pain. EARS, NOSE, THROAT: Denies tinnitus, ear pain, hearing loss. RESPIRATORY: Denies cough, wheeze, shortness of breath.  CARDIOVASCULAR: Denies chest pain, palpitations, edema.  GASTROINTESTINAL: Denies nausea, vomiting, diarrhea, abdominal pain. Denies bright red blood per rectum. GENITOURINARY: Denies dysuria, hematuria. ENDOCRINE: Denies nocturia or thyroid problems. HEMATOLOGIC AND LYMPHATIC: Denies easy bruising or bleeding. SKIN: Denies rash or lesion. MUSCULOSKELETAL: Denies pain in neck, back, shoulder, knees, hips or arthritic symptoms.  NEUROLOGIC: Denies paralysis, paresthesias.  PSYCHIATRIC: Denies anxiety or depressive symptoms.   VITAL SIGNS:  Blood pressure 108/69, pulse 75, temperature 97.5 F (36.4 C), temperature source Oral, resp. rate 18, height 5'  1" (1.549 m), weight 51.2 kg (112 lb 12.8 oz), SpO2 93 %.  I/O:    Intake/Output Summary (Last 24 hours) at 02/20/17 1319 Last data filed at 02/20/17 1159  Gross per 24 hour  Intake              200 ml  Output              900 ml  Net             -700 ml    PHYSICAL EXAMINATION:  GENERAL:  83 y.o.-year-old patient lying in the bed with no acute distress.  EYES: Pupils equal, round, reactive to light and accommodation. No scleral icterus. Extraocular muscles intact.  HEENT: Head atraumatic, normocephalic. Oropharynx and nasopharynx clear.  NECK:  Supple, no jugular venous distention. No thyroid enlargement, no tenderness.  LUNGS: Normal breath sounds bilaterally, no wheezing, rales,rhonchi or crepitation. No use of accessory  muscles of respiration.  CARDIOVASCULAR: S1, S2 normal. No murmurs, rubs, or gallops.  ABDOMEN: Soft, non-tender, non-distended. Bowel sounds present. No organomegaly or mass.  EXTREMITIES:Left upper extremity with recent fracture  No pedal edema, cyanosis, or clubbing.  NEUROLOGIC: Cranial nerves II through XII are intact. Muscle strength 5/5 in all extremities. Sensation intact. Gait not checked.  PSYCHIATRIC: The patient is alert and oriented x 3.  SKIN: No obvious rash, lesion, or ulcer.   DATA REVIEW:   CBC  Recent Labs Lab 02/20/17 0445  WBC 9.5  HGB 13.0  HCT 38.2  PLT 394    Chemistries   Recent Labs Lab 02/18/17 1359  02/20/17 0445  NA 136  < > 138  K 3.8  < > 3.8  CL 101  < > 104  CO2 28  < > 28  GLUCOSE 135*  < > 106*  BUN 13  < > 14  CREATININE 0.82  < > 0.72  CALCIUM 9.2  < > 8.6*  AST 87*  --   --   ALT 23  --   --   ALKPHOS 79  --   --   BILITOT 0.6  --   --   < > = values in this interval not displayed.  Cardiac Enzymes  Recent Labs Lab 02/19/17 0200  TROPONINI 10.95*    Microbiology Results  Results for orders placed or performed during the hospital encounter of 02/18/17  MRSA PCR Screening     Status: None   Collection Time: 02/18/17  7:45 PM  Result Value Ref Range Status   MRSA by PCR NEGATIVE NEGATIVE Final    Comment:        The GeneXpert MRSA Assay (FDA approved for NASAL specimens only), is one component of a comprehensive MRSA colonization surveillance program. It is not intended to diagnose MRSA infection nor to guide or monitor treatment for MRSA infections.     RADIOLOGY:  Ct Angio Head W Or Wo Contrast  Result Date: 02/18/2017 CLINICAL DATA:  The patient awoke at 9:30 earlier today with Slurred speech and RIGHT facial droop. History of breast cancer. EXAM: CT ANGIOGRAPHY HEAD AND NECK TECHNIQUE: Multidetector CT imaging of the head and neck was performed using the standard protocol during bolus administration of  intravenous contrast. Multiplanar CT image reconstructions and MIPs were obtained to evaluate the vascular anatomy. Carotid stenosis measurements (when applicable) are obtained utilizing NASCET criteria, using the distal internal carotid diameter as the denominator. CONTRAST:  Isovue 370, 100 mL. COMPARISON:  CT chest reported separately. MR head  01/05/2013. FINDINGS: CT HEAD Calvarium and skull base: No fracture or destructive lesion. Mastoids and middle ears are grossly clear. LEFT frontal craniotomy appears uncomplicated. Paranasal sinuses: Imaged portions are clear. Orbits: Negative. Brain: No evidence of acute abnormality, including acute infarct, hemorrhage, hydrocephalus, or mass lesion. LEFT frontal encephalomalacia, previous brain surgery, for unknown indication. Moderate atrophy, not unexpected for age. Extensive white matter disease. CTA NECK Aortic arch: Standard branching. Imaged portion shows no evidence of aneurysm or dissection. No significant stenosis of the major arch vessel origins. Right carotid system: No significant plaque at the bifurcation. No evidence of dissection, stenosis (50% or greater) or occlusion. Left carotid system: No significant plaque at the bifurcation. No evidence of dissection, stenosis (50% or greater) or occlusion. Vertebral arteries: Codominant. No evidence of dissection, stenosis (50% or greater) or occlusion. Nonvascular soft tissues: No lung apex lesion or pneumothorax. No neck mass. Cervical spondylosis. Airway midline. CTA HEAD Anterior circulation: Dolichoectatic skull base internal carotid arteries with minimal calcification. Supraclinoid ICA segments widely patent. There is no proximal stenosis of the anterior, or middle cerebral arteries. Distal MCA branches widely patent. No significant stenosis, proximal occlusion, aneurysm, or vascular malformation. Posterior circulation: Codominant distal vertebral arteries. Dolichoectatic and widely patent basilar artery.  Proximal posterior cerebral arteries widely patent. No significant stenosis, proximal occlusion, aneurysm, or vascular malformation. Venous sinuses: As permitted by contrast timing, patent. Anatomic variants: None of significance. Delayed phase:   No abnormal intracranial enhancement. Review of the MIP images confirms the above findings IMPRESSION: No intracranial or extracranial stenosis or dissection. No evidence for acute vascular occlusion. Atrophy and small vessel disease. Sequelae of remote LEFT frontal craniotomy and LEFT frontal encephalomalacia. No intracranial metastatic disease is evident. Electronically Signed   By: Staci Righter M.D.   On: 02/18/2017 15:08   Ct Angio Neck W And/or Wo Contrast  Result Date: 02/18/2017 CLINICAL DATA:  The patient awoke at 9:30 earlier today with Slurred speech and RIGHT facial droop. History of breast cancer. EXAM: CT ANGIOGRAPHY HEAD AND NECK TECHNIQUE: Multidetector CT imaging of the head and neck was performed using the standard protocol during bolus administration of intravenous contrast. Multiplanar CT image reconstructions and MIPs were obtained to evaluate the vascular anatomy. Carotid stenosis measurements (when applicable) are obtained utilizing NASCET criteria, using the distal internal carotid diameter as the denominator. CONTRAST:  Isovue 370, 100 mL. COMPARISON:  CT chest reported separately. MR head 01/05/2013. FINDINGS: CT HEAD Calvarium and skull base: No fracture or destructive lesion. Mastoids and middle ears are grossly clear. LEFT frontal craniotomy appears uncomplicated. Paranasal sinuses: Imaged portions are clear. Orbits: Negative. Brain: No evidence of acute abnormality, including acute infarct, hemorrhage, hydrocephalus, or mass lesion. LEFT frontal encephalomalacia, previous brain surgery, for unknown indication. Moderate atrophy, not unexpected for age. Extensive white matter disease. CTA NECK Aortic arch: Standard branching. Imaged portion  shows no evidence of aneurysm or dissection. No significant stenosis of the major arch vessel origins. Right carotid system: No significant plaque at the bifurcation. No evidence of dissection, stenosis (50% or greater) or occlusion. Left carotid system: No significant plaque at the bifurcation. No evidence of dissection, stenosis (50% or greater) or occlusion. Vertebral arteries: Codominant. No evidence of dissection, stenosis (50% or greater) or occlusion. Nonvascular soft tissues: No lung apex lesion or pneumothorax. No neck mass. Cervical spondylosis. Airway midline. CTA HEAD Anterior circulation: Dolichoectatic skull base internal carotid arteries with minimal calcification. Supraclinoid ICA segments widely patent. There is no proximal stenosis of the anterior, or  middle cerebral arteries. Distal MCA branches widely patent. No significant stenosis, proximal occlusion, aneurysm, or vascular malformation. Posterior circulation: Codominant distal vertebral arteries. Dolichoectatic and widely patent basilar artery. Proximal posterior cerebral arteries widely patent. No significant stenosis, proximal occlusion, aneurysm, or vascular malformation. Venous sinuses: As permitted by contrast timing, patent. Anatomic variants: None of significance. Delayed phase:   No abnormal intracranial enhancement. Review of the MIP images confirms the above findings IMPRESSION: No intracranial or extracranial stenosis or dissection. No evidence for acute vascular occlusion. Atrophy and small vessel disease. Sequelae of remote LEFT frontal craniotomy and LEFT frontal encephalomalacia. No intracranial metastatic disease is evident. Electronically Signed   By: Staci Righter M.D.   On: 02/18/2017 15:08   Mr Brain Wo Contrast  Result Date: 02/18/2017 CLINICAL DATA:  Initial evaluation for acute aphasia, facial droop, concern for ischemia. EXAM: MRI HEAD WITHOUT CONTRAST TECHNIQUE: Multiplanar, multiecho pulse sequences of the brain  and surrounding structures were obtained without intravenous contrast. COMPARISON:  Prior CT from earlier the same day. FINDINGS: Brain: Study degraded by motion artifact. Diffuse prominence of the CSF containing spaces is compatible with generalized cerebral atrophy. Patchy and confluent T2/FLAIR hyperintensity throughout the periventricular and deep white matter both cerebral hemispheres most consistent with chronic microvascular ischemic disease, fairly advanced in nature. Chronic microvascular ischemic changes present within the pons as well. Superimposed scatter remote lacunar infarcts present within the bilateral thalami. Remote right paramedian pontine lacunar infarct noted. A focus of cortical encephalomalacia of present at the anterior left frontal lobe consistent with remote infarct as well. Approximate 1 cm focus of restricted diffusion within the left lentiform nucleus, consistent with a small acute ischemic lacunar type infarction (series 100, image 28). No associated mass effect or hemorrhage. Multiple scattered chronic micro hemorrhages seen involving the bilateral basal ganglia, thalami, and cerebellum, consistent with chronic hypertension/hypertensive encephalopathy. No mass lesion, midline shift or mass effect. Mild diffuse ventricular prominence related to global parenchymal volume loss of hydrocephalus. No extra-axial fluid collection. Major dural sinuses are patent. Pituitary gland suprasellar region within normal limits. Midline structures intact and normal. Vascular: Major intracranial vascular flow voids are maintained. Skull and upper cervical spine: Craniocervical junction within normal limits. Visualized upper cervical spine unremarkable. Post craniotomy changes noted at the left frontal calvarium. No acute scalp soft tissue abnormality. Sinuses/Orbits: Globes normal soft tissues within normal limits. Patient status post lens extraction bilaterally. Paranasal sinuses and mastoids are  clear. Inner ear structures normal. Other: None. IMPRESSION: 1. 1 cm acute ischemic nonhemorrhagic lacunar type infarct involving the left lentiform nucleus. 2. Moderate cerebral atrophy with advanced chronic microvascular ischemic disease with multiple scattered remote infarcts as above. 3. Extensive chronic micro hemorrhages involving predominantly the thalami and cerebellum, most consistent with chronic underlying hypertension. Electronically Signed   By: Jeannine Boga M.D.   On: 02/18/2017 17:35   Ct Angio Chest Aorta W And/or Wo Contrast  Result Date: 02/18/2017 CLINICAL DATA:  Slurred speech, RIGHT facial droop, concern for dissection of the aorta. EXAM: CT ANGIOGRAPHY CHEST WITH CONTRAST TECHNIQUE: Multidetector CT imaging of the chest was performed using the standard protocol during bolus administration of intravenous contrast. Multiplanar CT image reconstructions and MIPs were obtained to evaluate the vascular anatomy. CONTRAST:  Isovue 370, 100 mL. COMPARISON:  CT simulation chest from 05/06/2012. CTA head neck reported separately. FINDINGS: Cardiovascular: Preferential opacification of the thoracic aorta. No evidence of thoracic aortic aneurysm or dissection. Mild calcified atheromatous change along the transverse arch. Normal heart size. No  pericardial effusion. Posterior wall plaque decreased in a descending aorta without significant luminal narrowing. Mediastinum/Nodes: No enlarged mediastinal, hilar, or axillary lymph nodes. Thyroid gland, trachea, and esophagus demonstrate no significant findings. Lungs/Pleura: There is a vague area of architectural distortion, posterior peripheral medial RIGHT lower lobe, image 37 series 5 which could be identified on the previous CT Sim exam and is stable. No pulmonary nodules seen. There is no significant pleural effusion. Upper Abdomen: No acute abnormality. Musculoskeletal: No chest wall abnormality. No acute or significant osseous findings.  Postsurgical changes, LEFT breast. Review of the MIP images confirms the above findings. IMPRESSION: No evidence for aortic dissection or other acute  chest pathology. Electronically Signed   By: Staci Righter M.D.   On: 02/18/2017 14:51    EKG:   Orders placed or performed during the hospital encounter of 02/18/17  . ED EKG  . ED EKG      Management plans discussed with the patient, family and they are in agreement.  CODE STATUS:     Code Status Orders        Start     Ordered   02/18/17 1608  Do not attempt resuscitation (DNR)  Continuous    Question Answer Comment  In the event of cardiac or respiratory ARREST Do not call a "code blue"   In the event of cardiac or respiratory ARREST Do not perform Intubation, CPR, defibrillation or ACLS   In the event of cardiac or respiratory ARREST Use medication by any route, position, wound care, and other measures to relive pain and suffering. May use oxygen, suction and manual treatment of airway obstruction as needed for comfort.      02/18/17 1609    Code Status History    Date Active Date Inactive Code Status Order ID Comments User Context   This patient has a current code status but no historical code status.    Advance Directive Documentation     Most Recent Value  Type of Advance Directive  Healthcare Power of Attorney  Pre-existing out of facility DNR order (yellow form or pink MOST form)  -  "MOST" Form in Place?  -      TOTAL TIME TAKING CARE OF THIS PATIENT: 45  minutes.   Note: This dictation was prepared with Dragon dictation along with smaller phrase technology. Any transcriptional errors that result from this process are unintentional.   @[facility] @  on 02/20/2017 at 1:19 PM  Between 7am to 6pm - Pager - [phone]  After 6pm go to [email or web address] - password [email or web address] [facility]  [facility]  Office  [phone]  CC: Primary care physician; [name], MD

## 2017-02-20 NOTE — Evaluation (Signed)
Speech Language Pathology Evaluation Patient Details Name: Bianca Taylor MRN: [id number] DOB: July 03, 1930 Today's Date: 02/20/2017 Time: 0915-1000 SLP Time Calculation (min) (ACUTE ONLY): 45 min  Problem List:  Patient Active Problem List   Diagnosis Date Noted  . STEMI (ST elevation myocardial infarction) ([place]) 02/18/2017   Past Medical History:  Past Medical History:  Diagnosis Date  . Breast cancer ([place])    left 2013  . COPD (chronic obstructive pulmonary disease) ([place])   . GERD (gastroesophageal reflux disease)   . Hypercholesterolemia   . Hypertension    Past Surgical History:  Past Surgical History:  Procedure Laterality Date  . BRAIN SURGERY    . BREAST BIOPSY Left    positive 03/2012  . BREAST BIOPSY Bilateral    negative 2000  . BREAST EXCISIONAL BIOPSY Left    positive 04/2012  . BREAST MAMMOSITE Left   . CATARACT EXTRACTION     HPI:  Pt is a 83 y.o. female with a known history of Hypertension, breast cancer presents to the emergency room with dysarthria since waking up today morning. Husband also noticed left facial droop. Last known well time is 10:30 PM. No other focal weakness or numbness. A code stroke was called. CT scan showed no acute stroke. An EKG was obtained which showed inferior wall ST elevation MI. Patient has been evaluated by cardiology at this time. She did have some vomiting and nausea and chest pain 2 days back with is thought to be be even bleeding to the MI. Presently patient is being admitted to ICU for medical management and neurology evaluation. Will need cardiac catheterization as per cardiology recommendations. Pt does present w/ overt dysarthria but is conversive and appropriate w/ her responses answering questions and following instructions. Noted MRI results indicating 1 cm acute ischemic nonhemorrhagic lacunar type infarct involving Left lentiform nucleus; Moderate cerebral atrophy with advanced chronic microvascular ischemic dis., and  Extensive chronic micro hemorrhages involving predominantly the thalami and cerebellum.    Assessment / Plan / Recommendation Clinical Impression  Pt presents w/ RIGHT side lingual and labial weakness resulting in Moderate Dysarthria which is moderately impacting her verbal communication during conversation w/ others. Pt exhibits decreased articulatory precision at the word-sentence level d/t decreased lingual strength and ROM; reduced coordinated movements for precise articulation. Pt is able to utilize articulatory strategies to include slowing rate of speech, over-articulating, increasing her volume, and stressing words to increase her intelligibility during conversation w/ others. Pt would benefit from continued ST services to target OMEs and speech tasks to improve Dysarthria for verbal communication during ADLs.     SLP Assessment  SLP Recommendation/Assessment: Patient needs continued Speech Lanaguage Pathology Services SLP Visit Diagnosis: Dysarthria and anarthria (R47.1)    Follow Up Recommendations  Home health SLP    Frequency and Duration min 3x week  2 weeks      SLP Evaluation Cognition  Overall Cognitive Status: Within Functional Limits for tasks assessed Arousal/Alertness: Awake/alert Orientation Level: Oriented X4 Attention: Focused;Sustained Focused Attention: Appears intact Sustained Attention: Appears intact Memory: Appears intact Awareness: Appears intact Problem Solving: Appears intact Executive Function: Reasoning;Self Monitoring Reasoning: Appears intact Self Monitoring: Appears intact Behaviors:  (WFL) Safety/Judgment: Appears intact Comments: in general discussion       Comprehension  Auditory Comprehension Overall Auditory Comprehension: Appears within functional limits for tasks assessed Yes/No Questions: Within Functional Limits Commands: Within Functional Limits Conversation: Complex Interfering Components: Motor planning (for  speech) EffectiveTechniques: Slowed speech;Stressing words Reading Comprehension Reading Status:  Not tested    Expression Expression Primary Mode of Expression: Verbal Verbal Expression Overall Verbal Expression: Impaired (d/t Dysarthria; but WFL for expressive language tasks) Automatic Speech:  (WFL for task) Level of Generative/Spontaneous Verbalization: Phrase;Sentence Repetition: No impairment Naming: No impairment Pragmatics: No impairment Interfering Components: Speech intelligibility Effective Techniques: Articulatory cues Non-Verbal Means of Communication: Gestures Other Verbal Expression Comments: Dysarthria moderately impacted verbal communication during conversation w/ others Written Expression Dominant Hand: Right Written Expression: Not tested   Oral / Motor  Oral Motor/Sensory Function Overall Oral Motor/Sensory Function: Mild impairment (-Moderate impairment on Right side) Facial ROM: Reduced right Facial Symmetry: Abnormal symmetry right Facial Strength: Reduced right Facial Sensation: Reduced right Lingual ROM: Reduced right Lingual Symmetry: Abnormal symmetry right Lingual Strength: Reduced (moreso during speech for precise, coord. movements) Lingual Sensation: Reduced Velum: Within Functional Limits Mandible: Within Functional Limits Motor Speech Overall Motor Speech: Impaired Respiration: Within functional limits Phonation: Normal Resonance: Within functional limits Articulation: Impaired Level of Impairment: Phrase Intelligibility: Intelligibility reduced Word: 50-74% accurate Phrase: 50-74% accurate Sentence: 50-74% accurate Conversation: 50-74% accurate Motor Planning:  (WFL-Fair) Effective Techniques: Over-articulate;Increased vocal intensity;Slow rate   GO                    Orinda Kenner, MS, CCC-SLP Watson,Katherine 02/20/2017, 2:52 PM

## 2017-02-20 NOTE — Progress Notes (Signed)
Progress Note  Patient Name: Bianca Taylor Date of Encounter: 02/20/2017  Primary Cardiologist: New ( Seen by Dr. Saunders Revel)  Subjective   She denies any chest pain or shortness of breath. Physical therapy consult is pending.  Inpatient Medications    Scheduled Meds: . aspirin EC  81 mg Oral QHS  . atorvastatin  40 mg Oral q1800  . clopidogrel  75 mg Oral Daily  . enoxaparin (LOVENOX) injection  40 mg Subcutaneous Q24H  . metoprolol tartrate  12.5 mg Oral BID  . sodium chloride flush  3 mL Intravenous Q12H  . tamoxifen  20 mg Oral QHS   Continuous Infusions:  PRN Meds: acetaminophen **OR** acetaminophen, albuterol, butalbital-acetaminophen-caffeine, cyclobenzaprine, ondansetron **OR** ondansetron (ZOFRAN) IV, polyethylene glycol, traMADol, zolpidem   Vital Signs    Vitals:   02/19/17 1059 02/19/17 1440 02/19/17 1938 02/20/17 0439  BP: 129/74 121/75 126/78 126/75  Pulse: 81 88 95 88  Resp: (!) 24 17 18 16   Temp:  98.2 F (36.8 C) 98.3 F (36.8 C) 98.3 F (36.8 C)  TempSrc:   Oral Oral  SpO2: 96% 95% 94% 95%  Weight:    112 lb 12.8 oz (51.2 kg)  Height:        Intake/Output Summary (Last 24 hours) at 02/20/17 1001 Last data filed at 02/20/17 0928  Gross per 24 hour  Intake              200 ml  Output              975 ml  Net             -775 ml   Filed Weights   02/18/17 1350 02/19/17 0500 02/20/17 0439  Weight: 125 lb (56.7 kg) 114 lb 10.2 oz (52 kg) 112 lb 12.8 oz (51.2 kg)    Telemetry    Normal sinus rhythm with no arrhythmia. - Personally Reviewed  ECG     Normal sinus rhythm with a 3-4 mm ST elevation in the inferior leads with reciprocal changes in V1 and V2 - Personally Reviewed  Physical Exam   GEN: No acute distress.   Neck: No JVD Cardiac: RRR, no murmurs, rubs, or gallops.  Respiratory: Clear to auscultation bilaterally. GI: Soft, nontender, non-distended  MS: No edema; No deformity. Neuro:  Facial droop Psych: Normal affect   Labs     Chemistry  Recent Labs Lab 02/18/17 1359 02/19/17 0200 02/20/17 0445  NA 136 138 138  K 3.8 3.6 3.8  CL 101 103 104  CO2 28 28 28   GLUCOSE 135* 106* 106*  BUN 13 14 14   CREATININE 0.82 0.67 0.72  CALCIUM 9.2 8.8* 8.6*  PROT 7.2  --   --   ALBUMIN 3.9  --   --   AST 87*  --   --   ALT 23  --   --   ALKPHOS 79  --   --   BILITOT 0.6  --   --   GFRNONAA >60 >60 >60  GFRAA >60 >60 >60  ANIONGAP 7 7 6      Hematology  Recent Labs Lab 02/18/17 1359 02/19/17 0200 02/20/17 0445  WBC 11.1* 9.4 9.5  RBC 4.94 4.48 4.26  HGB 14.7 13.6 13.0  HCT 44.9 40.8 38.2  MCV 90.9 91.1 89.7  MCH 29.8 30.2 30.5  MCHC 32.8 33.2 34.0  RDW 14.0 13.4 13.4  PLT 421 382 394    Cardiac Enzymes  Recent Labs Lab  02/18/17 1359 02/18/17 2001 02/19/17 0200  TROPONINI 15.07* 13.38* 10.95*   No results for input(s): TROPIPOC in the last 168 hours.   BNPNo results for input(s): BNP, PROBNP in the last 168 hours.   DDimer No results for input(s): DDIMER in the last 168 hours.   Radiology    Ct Angio Head W Or Wo Contrast  Result Date: 02/18/2017 CLINICAL DATA:  The patient awoke at 9:30 earlier today with Slurred speech and RIGHT facial droop. History of breast cancer. EXAM: CT ANGIOGRAPHY HEAD AND NECK TECHNIQUE: Multidetector CT imaging of the head and neck was performed using the standard protocol during bolus administration of intravenous contrast. Multiplanar CT image reconstructions and MIPs were obtained to evaluate the vascular anatomy. Carotid stenosis measurements (when applicable) are obtained utilizing NASCET criteria, using the distal internal carotid diameter as the denominator. CONTRAST:  Isovue 370, 100 mL. COMPARISON:  CT chest reported separately. MR head 01/05/2013. FINDINGS: CT HEAD Calvarium and skull base: No fracture or destructive lesion. Mastoids and middle ears are grossly clear. LEFT frontal craniotomy appears uncomplicated. Paranasal sinuses: Imaged portions are  clear. Orbits: Negative. Brain: No evidence of acute abnormality, including acute infarct, hemorrhage, hydrocephalus, or mass lesion. LEFT frontal encephalomalacia, previous brain surgery, for unknown indication. Moderate atrophy, not unexpected for age. Extensive white matter disease. CTA NECK Aortic arch: Standard branching. Imaged portion shows no evidence of aneurysm or dissection. No significant stenosis of the major arch vessel origins. Right carotid system: No significant plaque at the bifurcation. No evidence of dissection, stenosis (50% or greater) or occlusion. Left carotid system: No significant plaque at the bifurcation. No evidence of dissection, stenosis (50% or greater) or occlusion. Vertebral arteries: Codominant. No evidence of dissection, stenosis (50% or greater) or occlusion. Nonvascular soft tissues: No lung apex lesion or pneumothorax. No neck mass. Cervical spondylosis. Airway midline. CTA HEAD Anterior circulation: Dolichoectatic skull base internal carotid arteries with minimal calcification. Supraclinoid ICA segments widely patent. There is no proximal stenosis of the anterior, or middle cerebral arteries. Distal MCA branches widely patent. No significant stenosis, proximal occlusion, aneurysm, or vascular malformation. Posterior circulation: Codominant distal vertebral arteries. Dolichoectatic and widely patent basilar artery. Proximal posterior cerebral arteries widely patent. No significant stenosis, proximal occlusion, aneurysm, or vascular malformation. Venous sinuses: As permitted by contrast timing, patent. Anatomic variants: None of significance. Delayed phase:   No abnormal intracranial enhancement. Review of the MIP images confirms the above findings IMPRESSION: No intracranial or extracranial stenosis or dissection. No evidence for acute vascular occlusion. Atrophy and small vessel disease. Sequelae of remote LEFT frontal craniotomy and LEFT frontal encephalomalacia. No  intracranial metastatic disease is evident. Electronically Signed   By: Staci Righter M.D.   On: 02/18/2017 15:08   Ct Angio Neck W And/or Wo Contrast  Result Date: 02/18/2017 CLINICAL DATA:  The patient awoke at 9:30 earlier today with Slurred speech and RIGHT facial droop. History of breast cancer. EXAM: CT ANGIOGRAPHY HEAD AND NECK TECHNIQUE: Multidetector CT imaging of the head and neck was performed using the standard protocol during bolus administration of intravenous contrast. Multiplanar CT image reconstructions and MIPs were obtained to evaluate the vascular anatomy. Carotid stenosis measurements (when applicable) are obtained utilizing NASCET criteria, using the distal internal carotid diameter as the denominator. CONTRAST:  Isovue 370, 100 mL. COMPARISON:  CT chest reported separately. MR head 01/05/2013. FINDINGS: CT HEAD Calvarium and skull base: No fracture or destructive lesion. Mastoids and middle ears are grossly clear. LEFT frontal craniotomy appears  uncomplicated. Paranasal sinuses: Imaged portions are clear. Orbits: Negative. Brain: No evidence of acute abnormality, including acute infarct, hemorrhage, hydrocephalus, or mass lesion. LEFT frontal encephalomalacia, previous brain surgery, for unknown indication. Moderate atrophy, not unexpected for age. Extensive white matter disease. CTA NECK Aortic arch: Standard branching. Imaged portion shows no evidence of aneurysm or dissection. No significant stenosis of the major arch vessel origins. Right carotid system: No significant plaque at the bifurcation. No evidence of dissection, stenosis (50% or greater) or occlusion. Left carotid system: No significant plaque at the bifurcation. No evidence of dissection, stenosis (50% or greater) or occlusion. Vertebral arteries: Codominant. No evidence of dissection, stenosis (50% or greater) or occlusion. Nonvascular soft tissues: No lung apex lesion or pneumothorax. No neck mass. Cervical spondylosis.  Airway midline. CTA HEAD Anterior circulation: Dolichoectatic skull base internal carotid arteries with minimal calcification. Supraclinoid ICA segments widely patent. There is no proximal stenosis of the anterior, or middle cerebral arteries. Distal MCA branches widely patent. No significant stenosis, proximal occlusion, aneurysm, or vascular malformation. Posterior circulation: Codominant distal vertebral arteries. Dolichoectatic and widely patent basilar artery. Proximal posterior cerebral arteries widely patent. No significant stenosis, proximal occlusion, aneurysm, or vascular malformation. Venous sinuses: As permitted by contrast timing, patent. Anatomic variants: None of significance. Delayed phase:   No abnormal intracranial enhancement. Review of the MIP images confirms the above findings IMPRESSION: No intracranial or extracranial stenosis or dissection. No evidence for acute vascular occlusion. Atrophy and small vessel disease. Sequelae of remote LEFT frontal craniotomy and LEFT frontal encephalomalacia. No intracranial metastatic disease is evident. Electronically Signed   By: Staci Righter M.D.   On: 02/18/2017 15:08   Mr Brain Wo Contrast  Result Date: 02/18/2017 CLINICAL DATA:  Initial evaluation for acute aphasia, facial droop, concern for ischemia. EXAM: MRI HEAD WITHOUT CONTRAST TECHNIQUE: Multiplanar, multiecho pulse sequences of the brain and surrounding structures were obtained without intravenous contrast. COMPARISON:  Prior CT from earlier the same day. FINDINGS: Brain: Study degraded by motion artifact. Diffuse prominence of the CSF containing spaces is compatible with generalized cerebral atrophy. Patchy and confluent T2/FLAIR hyperintensity throughout the periventricular and deep white matter both cerebral hemispheres most consistent with chronic microvascular ischemic disease, fairly advanced in nature. Chronic microvascular ischemic changes present within the pons as well.  Superimposed scatter remote lacunar infarcts present within the bilateral thalami. Remote right paramedian pontine lacunar infarct noted. A focus of cortical encephalomalacia of present at the anterior left frontal lobe consistent with remote infarct as well. Approximate 1 cm focus of restricted diffusion within the left lentiform nucleus, consistent with a small acute ischemic lacunar type infarction (series 100, image 28). No associated mass effect or hemorrhage. Multiple scattered chronic micro hemorrhages seen involving the bilateral basal ganglia, thalami, and cerebellum, consistent with chronic hypertension/hypertensive encephalopathy. No mass lesion, midline shift or mass effect. Mild diffuse ventricular prominence related to global parenchymal volume loss of hydrocephalus. No extra-axial fluid collection. Major dural sinuses are patent. Pituitary gland suprasellar region within normal limits. Midline structures intact and normal. Vascular: Major intracranial vascular flow voids are maintained. Skull and upper cervical spine: Craniocervical junction within normal limits. Visualized upper cervical spine unremarkable. Post craniotomy changes noted at the left frontal calvarium. No acute scalp soft tissue abnormality. Sinuses/Orbits: Globes normal soft tissues within normal limits. Patient status post lens extraction bilaterally. Paranasal sinuses and mastoids are clear. Inner ear structures normal. Other: None. IMPRESSION: 1. 1 cm acute ischemic nonhemorrhagic lacunar type infarct involving the left lentiform nucleus.  2. Moderate cerebral atrophy with advanced chronic microvascular ischemic disease with multiple scattered remote infarcts as above. 3. Extensive chronic micro hemorrhages involving predominantly the thalami and cerebellum, most consistent with chronic underlying hypertension. Electronically Signed   By: Jeannine Boga M.D.   On: 02/18/2017 17:35   Ct Angio Chest Aorta W And/or Wo  Contrast  Result Date: 02/18/2017 CLINICAL DATA:  Slurred speech, RIGHT facial droop, concern for dissection of the aorta. EXAM: CT ANGIOGRAPHY CHEST WITH CONTRAST TECHNIQUE: Multidetector CT imaging of the chest was performed using the standard protocol during bolus administration of intravenous contrast. Multiplanar CT image reconstructions and MIPs were obtained to evaluate the vascular anatomy. CONTRAST:  Isovue 370, 100 mL. COMPARISON:  CT simulation chest from 05/06/2012. CTA head neck reported separately. FINDINGS: Cardiovascular: Preferential opacification of the thoracic aorta. No evidence of thoracic aortic aneurysm or dissection. Mild calcified atheromatous change along the transverse arch. Normal heart size. No pericardial effusion. Posterior wall plaque decreased in a descending aorta without significant luminal narrowing. Mediastinum/Nodes: No enlarged mediastinal, hilar, or axillary lymph nodes. Thyroid gland, trachea, and esophagus demonstrate no significant findings. Lungs/Pleura: There is a vague area of architectural distortion, posterior peripheral medial RIGHT lower lobe, image 37 series 5 which could be identified on the previous CT Sim exam and is stable. No pulmonary nodules seen. There is no significant pleural effusion. Upper Abdomen: No acute abnormality. Musculoskeletal: No chest wall abnormality. No acute or significant osseous findings. Postsurgical changes, LEFT breast. Review of the MIP images confirms the above findings. IMPRESSION: No evidence for aortic dissection or other acute  chest pathology. Electronically Signed   By: Staci Righter M.D.   On: 02/18/2017 14:51    Cardiac Studies   Echocardiogram was personally reviewed by me and showed mildly reduced LV systolic function with an EF of 45-50% with moderate inferior wall hypokinesis and no evidence of pulmonary hypertension.  Patient Profile     83 y.o. female with past medical history of hypertension, hyperlipidemia,  breast cancer, COPD and meningioma who presented with facial droop and slurred speech. EKG showed inferior ST elevation myocardial infarction with elevated troponin.  Assessment & Plan    1. Late presenting inferior ST elevation myocardial infarction:Given her age and acute stroke, we will likely treat her medically with consideration of cardiac catheterization in the future if she has anginal symptoms. She is currently on dual antiplatelet therapy with aspirin and clopidogrel. I added metoprolol 12.5 mg twice daily. Echocardiogram showed an EF of 45-50%. An ACE inhibitor or ARB can be considered as an outpatient if blood pressure allows. Given her acute stroke, we don't want to drop blood pressure too much.  2. Acute stroke: Confirmed by MRI. Continue medical therapy.   3. Hyperlipidemia:  pravastatin was switched to  atorvastatin 40 mg daily.  recommend physical therapy consult. If no cardiac symptoms, the patient can be discharged home from a cardiac standpoint. We will arrange for follow-up in our office in 2 weeks.  I discussed the plan with Dr. Berline Lopes and the patient's husband.  Signed, Kathlyn Sacramento, MD  02/20/2017, 10:01 AM

## 2017-02-20 NOTE — Care Management Important Message (Signed)
Important Message  Patient Details  Name: Bianca Taylor MRN: [id number] Date of Birth: 08-Jan-1930   Medicare Important Message Given:  Yes Signed IM notice given    Katrina Stack, RN 02/20/2017, 2:35 PM

## 2017-02-20 NOTE — Evaluation (Signed)
Physical Therapy Evaluation Patient Details Name: Bianca Taylor MRN: [id number] DOB: February 24, 1930 Today's Date: 02/20/2017   History of Present Illness  Pt is a 83 y/o F who presented with L facial droop.  Head CT showed no acute stroke.  An EKG was obtained which showed inferior wall ST elevation MI.  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  Pt's PMH includes breast cancer, COPD, brain surgery.    Clinical Impression  Pt admitted with above diagnosis. Pt currently with functional limitations due to the deficits listed below (see PT Problem List). Bianca Taylor presents with dysarthria but no new changes in strength, sensation, or coordination either UE/LE.  She has h/o ~6 wk old L wrist fx so we continued NWB LUE as pt has not yet had follow up with Dr. Sabra Heck yet for discussion on WB status.  Pt was Ind before L wrist fx but has required assist from husband for bathing, dressing since the fx.  She currently requires close min guard assist and cues for safe transfers.  She completed stair training with cues for safe technique and min guard assist.  She requires 1 person HHA to ambulate in hallway due to narrow base of support and unsteadiness (pt has not been OOB for 3 days per pt report). Instructed pt to use SPC at d/c with husband providing min guard assist for any OOB activity.  Recommending HHPT at d/c to progress safety with ambulation, transfers, and stairs and to address balance and endurance.  Pt will benefit from skilled PT to increase their independence and safety with mobility to allow discharge to the venue listed below.      Follow Up Recommendations Home health PT;Supervision for mobility/OOB    Equipment Recommendations  None recommended by PT (pt has several SPCs at home)    Recommendations for Other Services       Precautions / Restrictions Precautions Precautions: Fall Restrictions Weight Bearing Restrictions: Yes LUE Weight Bearing: Non weight bearing Other  Position/Activity Restrictions: Pt fell and broke her wrist ~6 weeks ago.  Follow up appointment with Dr. Sabra Heck to determine new WB status was scheduled for yesterday so they will reschedule this but for now pt is to continue NWB LUE.      Mobility  Bed Mobility Overal bed mobility: Modified Independent             General bed mobility comments: Pt requires increased time and effort for supine>sit.  HOB flat and no railing for simulation of home environment.  Pt does an excellent job of NWB LUE without cues.  Transfers Overall transfer level: Needs assistance Equipment used: None Transfers: Sit to/from Stand Sit to Stand: Min guard         General transfer comment: Cues for hand placement for sit<>stand and cues to be sure to back up all the way to the chair before sitting.  Close min guard for safety.  Ambulation/Gait Ambulation/Gait assistance: Min assist Ambulation Distance (Feet): 115 Feet Assistive device: 1 person hand held assist;None Gait Pattern/deviations: Step-through pattern;Narrow base of support;Staggering left;Staggering right Gait velocity: decreased Gait velocity interpretation: Below normal speed for age/gender General Gait Details: Pt ambulates with a narrow base of support, causing her to become unsteady and requiring up to min assist when not provided 1 person HHA.  With 1 person HHA she is steady, therefore recommending pt using cane at d/c.  She fatigues after stair training and ambulating 115 ft.    Stairs Stairs: Yes Stairs assistance:  Min guard Stair Management: One rail Left;Step to pattern;Alternating pattern;Forwards Number of Stairs: 6 General stair comments: Cues for technique ascending steps sideways with RUE holding onto railing (due to L wrist fx).  During descent the pt initially performs alternating pattern with some instability.  Pt instructed to perform with step to pattern and is steady when performing this.  Wheelchair Mobility     Modified Rankin (Stroke Patients Only) Modified Rankin (Stroke Patients Only) Pre-Morbid Rankin Score: No symptoms Modified Rankin: Moderately severe disability     Balance Overall balance assessment: Needs assistance Sitting-balance support: No upper extremity supported;Feet supported Sitting balance-Leahy Scale: Good     Standing balance support: No upper extremity supported;During functional activity Standing balance-Leahy Scale: Fair Standing balance comment: Pt able to stand statically without UE support but requires UE support for dynamic activities.                             Pertinent Vitals/Pain Pain Assessment: No/denies pain    Home Living Family/patient expects to be discharged to:: Private residence Living Arrangements: Spouse/significant other Available Help at Discharge: Family;Available 24 hours/day Type of Home: House Home Access: Stairs to enter Entrance Stairs-Rails: Left Entrance Stairs-Number of Steps: 2 Home Layout: Two level;Bed/bath upstairs Home Equipment: Cane - single point;Shower seat - built in;Grab bars - tub/shower;Hand held shower head      Prior Function Level of Independence: Needs assistance   Gait / Transfers Assistance Needed: Pt has been ambulating without AD.  1 fall in the past year which was ~6 wks ago.    ADL's / Homemaking Assistance Needed: Pt requires assist for bathing and dressing due to L wrist fx.  She does not cook at baseline by choice and does not drive at baseline as she says she didn't feel safe doing it anymore.    Comments: Pt was Ind without AD prior to fall ~6 weeks ago with resultant L wrist fx.  Due to L wrist fx the pt has been requiring assist with the above mentioned activities.     Hand Dominance   Dominant Hand: Right    Extremity/Trunk Assessment   Upper Extremity Assessment Upper Extremity Assessment: LUE deficits/detail (RUE strength, coordination, and sensation WNL) LUE Deficits /  Details: L wrist fx, pt reports she has been told she no longer needs to wear splint.  Shoulder strenth grossly WNL. LUE: Unable to fully assess due to pain LUE Sensation:  (WNL) LUE Coordination:  (WNL)    Lower Extremity Assessment Lower Extremity Assessment:  (BLE strength, coordination, and sensation all WNL.)    Cervical / Trunk Assessment Cervical / Trunk Assessment: Kyphotic  Communication   Communication: Other (comment) (dysarthria )  Cognition Arousal/Alertness: Awake/alert Behavior During Therapy: WFL for tasks assessed/performed Overall Cognitive Status: Within Functional Limits for tasks assessed                                        General Comments General comments (skin integrity, edema, etc.): Reviewed the signs and symptoms of a stroke with pt and husband with teachback technique with instruction to call 911 immediately if they notice any of these symptoms.  Pt and husband initially only able to report ~1/2 of the signs and symptoms but verbalize understanding of all signs and symptoms following.    Exercises     Assessment/Plan  PT Assessment Patient needs continued PT services  PT Problem List Decreased activity tolerance;Decreased balance;Decreased knowledge of use of DME;Decreased safety awareness       PT Treatment Interventions DME instruction;Gait training;Stair training;Functional mobility training;Therapeutic activities;Therapeutic exercise;Balance training;Neuromuscular re-education;Patient/family education;Modalities    PT Goals (Current goals can be found in the Care Plan section)  Acute Rehab PT Goals Patient Stated Goal: to go home and to regain independence PT Goal Formulation: With patient Time For Goal Achievement: 03/06/17 Potential to Achieve Goals: Good    Frequency 7X/week   Barriers to discharge        Co-evaluation               AM-PAC PT "6 Clicks" Daily Activity  Outcome Measure Difficulty turning  over in bed (including adjusting bedclothes, sheets and blankets)?: None Difficulty moving from lying on back to sitting on the side of the bed? : None Difficulty sitting down on and standing up from a chair with arms (e.g., wheelchair, bedside commode, etc,.)?: A Little Help needed moving to and from a bed to chair (including a wheelchair)?: A Little Help needed walking in hospital room?: A Little Help needed climbing 3-5 steps with a railing? : A Little 6 Click Score: 20    End of Session Equipment Utilized During Treatment: Gait belt Activity Tolerance: Patient tolerated treatment well;Patient limited by fatigue Patient left: in chair;with call bell/phone within reach;with chair alarm set;with family/visitor present Nurse Communication: Mobility status PT Visit Diagnosis: Unsteadiness on feet (R26.81);Difficulty in walking, not elsewhere classified (R26.2)    Time: 5056-9794 PT Time Calculation (min) (ACUTE ONLY): 45 min   Charges:   PT Evaluation $PT Eval Low Complexity: 1 Procedure PT Treatments $Gait Training: 8-22 mins $Therapeutic Activity: 8-22 mins   PT G Codes:        Collie Siad PT, DPT 02/20/2017, 11:40 AM

## 2017-02-20 NOTE — Care Management (Addendum)
Patient for discharge.  Spoke with [facility] and not able to see within 24 hours of discharge. Referral to [facility] and informed willl be able to see patient within 24 hours of discharge.

## 2017-02-20 NOTE — Discharge Instructions (Signed)
Clopidogrel tablets What is this medicine? CLOPIDOGREL (kloh PID oh grel) helps to prevent blood clots. This medicine is used to prevent heart attack, stroke, or other vascular events in people who are at high risk. This medicine may be used for other purposes; ask your health care provider or pharmacist if you have questions. COMMON BRAND NAME(S): Plavix What should I tell my health care provider before I take this medicine? They need to know if you have any of the following conditions: -bleeding disorders -bleeding in the brain -having surgery -history of stomach bleeding -an unusual or allergic reaction to clopidogrel, other medicines, foods, dyes, or preservatives -pregnant or trying to get pregnant -breast-feeding How should I use this medicine? Take this medicine by mouth with a glass of water. Follow the directions on the prescription label. You may take this medicine with or without food. If it upsets your stomach, take it with food. Take your medicine at regular intervals. Do not take it more often than directed. Do not stop taking except on your doctor's advice. A special MedGuide will be given to you by the pharmacist with each prescription and refill. Be sure to read this information carefully each time. Talk to your pediatrician regarding the use of this medicine in children. Special care may be needed. Overdosage: If you think you have taken too much of this medicine contact a poison control center or emergency room at once. NOTE: This medicine is only for you. Do not share this medicine with others. What if I miss a dose? If you miss a dose, take it as soon as you can. If it is almost time for your next dose, take only that dose. Do not take double or extra doses. What may interact with this medicine? Do not take this medicine with the following medications: -dasabuvir; ombitasvir; paritaprevir; ritonavir -defibrotide This medicine may also interact with the following  medications: -antiviral medicines for HIV or AIDS -aspirin -certain medicines for depression like citalopram, fluoxetine, fluvoxamine -certain medicines for fungal infections like ketoconazole, fluconazole, voriconazole -certain medicines for seizures like felbamate, oxcarbazepine, phenytoin -certain medicines for stomach problems like cimetidine, omeprazole, esomeprazole -certain medicines that treat or prevent blood clots like warfarin, enoxaparin, dalteparin, apixaban, dabigatran, rivaroxaban, ticlopidine -chloramphenicol -cilostazol -fluvastatin -isoniazid -modafinil -nicardipine -NSAIDS, medicines for pain and inflammation, like ibuprofen or naproxen -quinine -repaglinide -tamoxifen -tolbutamide -topiramate -torsemide This list may not describe all possible interactions. Give your health care provider a list of all the medicines, herbs, non-prescription drugs, or dietary supplements you use. Also tell them if you smoke, drink alcohol, or use illegal drugs. Some items may interact with your medicine. What should I watch for while using this medicine? Visit your doctor or health care professional for regular check ups. Do not stop taking your medicine unless your doctor tells you to. Notify your doctor or health care professional and seek emergency treatment if you develop breathing problems; changes in vision; chest pain; severe, sudden headache; pain, swelling, warmth in the leg; trouble speaking; sudden numbness or weakness of the face, arm or leg. These can be signs that your condition has gotten worse. If you are going to have surgery or dental work, tell your doctor or health care professional that you are taking this medicine. Certain genetic factors may reduce the effect of this medicine. Your doctor may use genetic tests to determine treatment. What side effects may I notice from receiving this medicine? Side effects that you should report to your doctor or health care  professional as soon as possible: -allergic reactions like skin rash, itching or hives, swelling of the face, lips, or tongue -signs and symptoms of bleeding such as bloody or black, tarry stools; red or dark-brown urine; spitting up blood or brown material that looks like coffee grounds; red spots on the skin; unusual bruising or bleeding from the eye, gums, or nose -signs and symptoms of a blood clot such as breathing problems; changes in vision; chest pain; severe, sudden headache; pain, swelling, warmth in the leg; trouble speaking; sudden numbness or weakness of the face, arm or leg Side effects that usually do not require medical attention (report to your doctor or health care professional if they continue or are bothersome): -constipation -diarrhea -headache -upset stomach This list may not describe all possible side effects. Call your doctor for medical advice about side effects. You may report side effects to FDA at 1-800-FDA-1088. Where should I keep my medicine? Keep out of the reach of children. Store at room temperature of 59 to 86 degrees F (15 to 30 degrees C). Throw away any unused medicine after the expiration date. NOTE: This sheet is a summary. It may not cover all possible information. If you have questions about this medicine, talk to your doctor, pharmacist, or health care provider.  2018 Elsevier/Gold Standard (2015-06-14 10:00:44) Atorvastatin tablets What is this medicine? ATORVASTATIN (a TORE va sta tin) is known as a HMG-CoA reductase inhibitor or 'statin'. It lowers the level of cholesterol and triglycerides in the blood. This drug may also reduce the risk of heart attack, stroke, or other health problems in patients with risk factors for heart disease. Diet and lifestyle changes are often used with this drug. This medicine may be used for other purposes; ask your health care provider or pharmacist if you have questions. COMMON BRAND NAME(S): Lipitor What should I  tell my health care provider before I take this medicine? They need to know if you have any of these conditions: -frequently drink alcoholic beverages -history of stroke, TIA -kidney disease -liver disease -muscle aches or weakness -other medical condition -an unusual or allergic reaction to atorvastatin, other medicines, foods, dyes, or preservatives -pregnant or trying to get pregnant -breast-feeding How should I use this medicine? Take this medicine by mouth with a glass of water. Follow the directions on the prescription label. You can take this medicine with or without food. Take your doses at regular intervals. Do not take your medicine more often than directed. Talk to your pediatrician regarding the use of this medicine in children. While this drug may be prescribed for children as young as 19 years old for selected conditions, precautions do apply. Overdosage: If you think you have taken too much of this medicine contact a poison control center or emergency room at once. NOTE: This medicine is only for you. Do not share this medicine with others. What if I miss a dose? If you miss a dose, take it as soon as you can. If it is almost time for your next dose, take only that dose. Do not take double or extra doses. What may interact with this medicine? Do not take this medicine with any of the following medications: -red yeast rice -telaprevir -telithromycin -voriconazole This medicine may also interact with the following medications: -alcohol -antiviral medicines for HIV or AIDS -boceprevir -certain antibiotics like clarithromycin, erythromycin, troleandomycin -certain medicines for cholesterol like fenofibrate or gemfibrozil -cimetidine -clarithromycin -colchicine -cyclosporine -digoxin -female hormones, like estrogens or progestins and birth control pills -  grapefruit juice -medicines for fungal infections like fluconazole, itraconazole,  ketoconazole -niacin -rifampin -spironolactone This list may not describe all possible interactions. Give your health care provider a list of all the medicines, herbs, non-prescription drugs, or dietary supplements you use. Also tell them if you smoke, drink alcohol, or use illegal drugs. Some items may interact with your medicine. What should I watch for while using this medicine? Visit your doctor or health care professional for regular check-ups. You may need regular tests to make sure your liver is working properly. Tell your doctor or health care professional right away if you get any unexplained muscle pain, tenderness, or weakness, especially if you also have a fever and tiredness. Your doctor or health care professional may tell you to stop taking this medicine if you develop muscle problems. If your muscle problems do not go away after stopping this medicine, contact your health care professional. This drug is only part of a total heart-health program. Your doctor or a dietician can suggest a low-cholesterol and low-fat diet to help. Avoid alcohol and smoking, and keep a proper exercise schedule. Do not use this drug if you are pregnant or breast-feeding. Serious side effects to an unborn child or to an infant are possible. Talk to your doctor or pharmacist for more information. This medicine may affect blood sugar levels. If you have diabetes, check with your doctor or health care professional before you change your diet or the dose of your diabetic medicine. If you are going to have surgery tell your health care professional that you are taking this drug. What side effects may I notice from receiving this medicine? Side effects that you should report to your doctor or health care professional as soon as possible: -allergic reactions like skin rash, itching or hives, swelling of the face, lips, or tongue -dark urine -fever -joint pain -muscle cramps, pain -redness, blistering, peeling or  loosening of the skin, including inside the mouth -trouble passing urine or change in the amount of urine -unusually weak or tired -yellowing of eyes or skin Side effects that usually do not require medical attention (report to your doctor or health care professional if they continue or are bothersome): -constipation -heartburn -stomach gas, pain, upset This list may not describe all possible side effects. Call your doctor for medical advice about side effects. You may report side effects to FDA at 1-800-FDA-1088. Where should I keep my medicine? Keep out of the reach of children. Store at room temperature between 20 to 25 degrees C (68 to 77 degrees F). Throw away any unused medicine after the expiration date. NOTE: This sheet is a summary. It may not cover all possible information. If you have questions about this medicine, talk to your doctor, pharmacist, or health care provider.  2018 Elsevier/Gold Standard (2011-07-29 29:52:84) Metoprolol tablets What is this medicine? METOPROLOL (me TOE proe lole) is a beta-blocker. Beta-blockers reduce the workload on the heart and help it to beat more regularly. This medicine is used to treat high blood pressure and to prevent chest pain. It is also used to after a heart attack and to prevent an additional heart attack from occurring. This medicine may be used for other purposes; ask your health care provider or pharmacist if you have questions. COMMON BRAND NAME(S): Lopressor What should I tell my health care provider before I take this medicine? They need to know if you have any of these conditions: -diabetes -heart or vessel disease like slow heart rate, worsening heart  failure, heart block, sick sinus syndrome or Raynaud's disease -kidney disease -liver disease -lung or breathing disease, like asthma or emphysema -pheochromocytoma -thyroid disease -an unusual or allergic reaction to metoprolol, other beta-blockers, medicines, foods, dyes, or  preservatives -pregnant or trying to get pregnant -breast-feeding How should I use this medicine? Take this medicine by mouth with a drink of water. Follow the directions on the prescription label. Take this medicine immediately after meals. Take your doses at regular intervals. Do not take more medicine than directed. Do not stop taking this medicine suddenly. This could lead to serious heart-related effects. Talk to your pediatrician regarding the use of this medicine in children. Special care may be needed. Overdosage: If you think you have taken too much of this medicine contact a poison control center or emergency room at once. NOTE: This medicine is only for you. Do not share this medicine with others. What if I miss a dose? If you miss a dose, take it as soon as you can. If it is almost time for your next dose, take only that dose. Do not take double or extra doses. What may interact with this medicine? This medicine may interact with the following medications: -certain medicines for blood pressure, heart disease, irregular heart beat -certain medicines for depression like monoamine oxidase (MAO) inhibitors, fluoxetine, or paroxetine -clonidine -dobutamine -epinephrine -isoproterenol -reserpine This list may not describe all possible interactions. Give your health care provider a list of all the medicines, herbs, non-prescription drugs, or dietary supplements you use. Also tell them if you smoke, drink alcohol, or use illegal drugs. Some items may interact with your medicine. What should I watch for while using this medicine? Visit your doctor or health care professional for regular check ups. Contact your doctor right away if your symptoms worsen. Check your blood pressure and pulse rate regularly. Ask your health care professional what your blood pressure and pulse rate should be, and when you should contact them. You may get drowsy or dizzy. Do not drive, use machinery, or do anything  that needs mental alertness until you know how this medicine affects you. Do not sit or stand up quickly, especially if you are an older patient. This reduces the risk of dizzy or fainting spells. Contact your doctor if these symptoms continue. Alcohol may interfere with the effect of this medicine. Avoid alcoholic drinks. What side effects may I notice from receiving this medicine? Side effects that you should report to your doctor or health care professional as soon as possible: -allergic reactions like skin rash, itching or hives -cold or numb hands or feet -depression -difficulty breathing -faint -fever with sore throat -irregular heartbeat, chest pain -rapid weight gain -swollen legs or ankles Side effects that usually do not require medical attention (report to your doctor or health care professional if they continue or are bothersome): -anxiety or nervousness -change in sex drive or performance -dry skin -headache -nightmares or trouble sleeping -short term memory loss -stomach upset or diarrhea -unusually tired This list may not describe all possible side effects. Call your doctor for medical advice about side effects. You may report side effects to FDA at 1-800-FDA-1088. Where should I keep my medicine? Keep out of the reach of children. Store at room temperature between 15 and 30 degrees C (59 and 86 degrees F). Throw away any unused medicine after the expiration date. NOTE: This sheet is a summary. It may not cover all possible information. If you have questions about this medicine, talk  to your doctor, pharmacist, or health care provider.  2018 Elsevier/Gold Standard (2013-05-13 14:40:36)   Heart Attack A heart attack (myocardial infarction, MI) causes damage to the heart that cannot be fixed. A heart attack often happens when a blood clot or other blockage cuts blood flow to the heart. When this happens, certain areas of the heart begin to die. This causes the pain you  feel during a heart attack. Follow these instructions at home:  Take medicine as told by your doctor. You may need medicine to: ? Keep your blood from clotting too easily. ? Control your blood pressure. ? Lower your cholesterol. ? Control abnormal heart rhythms.  Change certain behaviors as told by your doctor. This may include: ? Quitting smoking. ? Being active. ? Eating a heart-healthy diet. Ask your doctor for help with this diet. ? Keeping a healthy weight. ? Keeping your diabetes under control. ? Lessening stress. ? Limiting how much alcohol you drink. Do not take these medicines unless your doctor says that you can:  Nonsteroidal anti-inflammatory drugs (NSAIDs). These include: ? Ibuprofen. ? Naproxen. ? Celecoxib.  Vitamin supplements that have vitamin A, vitamin E, or both.  Hormone therapy that contains estrogen with or without progestin.  Get help right away if:  You have sudden chest discomfort.  You have sudden discomfort in your: ? Arms. ? Back. ? Neck. ? Jaw.  You have shortness of breath at any time.  You have sudden sweating or clammy skin.  You feel sick to your stomach (nauseous) or throw up (vomit).  You suddenly get light-headed or dizzy.  You feel your heart beating fast or skipping beats. These symptoms may be an emergency. Do not wait to see if the symptoms will go away. Get medical help right away. Call your local emergency services (911 in the [place] Do not drive yourself to the hospital. This information is not intended to replace advice given to you by your health care provider. Make sure you discuss any questions you have with your health care provider. Document Released: 03/09/2012 Document Revised: 02/14/2016 Document Reviewed: 11/11/2013 Elsevier Interactive Patient Education  2017 [employer].    Stroke Prevention Some health problems and behaviors may make it more likely for you to have a stroke. Below are ways to lessen your  risk of having a stroke.  Be active for at least 30 minutes on most or all days.  Do not smoke. Try not to be around others who smoke.  Do not drink too much alcohol. ? Do not have more than 2 drinks a day if you are a man. ? Do not have more than 1 drink a day if you are a woman and are not pregnant.  Eat healthy foods, such as fruits and vegetables. If you were put on a specific diet, follow the diet as told.  Keep your cholesterol levels under control through diet and medicines. Look for foods that are low in saturated fat, trans fat, cholesterol, and are high in fiber.  If you have diabetes, follow all diet plans and take your medicine as told.  Ask your doctor if you need treatment to lower your blood pressure. If you have high blood pressure (hypertension), follow all diet plans and take your medicine as told by your doctor.  If you are 59-33 years old, have your blood pressure checked every 3-5 years. If you are age 30 or older, have your blood pressure checked every year.  Keep a healthy weight. Eat  foods that are low in calories, salt, saturated fat, trans fat, and cholesterol.  Do not take drugs.  Avoid birth control pills, if this applies. Talk to your doctor about the risks of taking birth control pills.  Talk to your doctor if you have sleep problems (sleep apnea).  Take all medicine as told by your doctor. ? You may be told to take aspirin or blood thinner medicine. Take this medicine as told by your doctor. ? Understand your medicine instructions.  Make sure any other conditions you have are being taken care of.  Get help right away if:  You suddenly lose feeling (you feel numb) or have weakness in your face, arm, or leg.  Your face or eyelid hangs down to one side.  You suddenly feel confused.  You have trouble talking (aphasia) or understanding what people are saying.  You suddenly have trouble seeing in one or both eyes.  You suddenly have trouble  walking.  You are dizzy.  You lose your balance or your movements are clumsy (uncoordinated).  You suddenly have a very bad headache and you do not know the cause.  You have new chest pain.  Your heart feels like it is fluttering or skipping a beat (irregular heartbeat). Do not wait to see if the symptoms above go away. Get help right away. Call your local emergency services (911 in [place] Do not drive yourself to the hospital. This information is not intended to replace advice given to you by your health care provider. Make sure you discuss any questions you have with your health care provider. Document Released: 03/09/2012 Document Revised: 02/14/2016 Document Reviewed: 03/11/2013 Elsevier Interactive Patient Education  2018 [employer]. Ischemic Stroke An ischemic stroke (cerebrovascular accident, or CVA) is the sudden death of brain tissue that occurs when an area of the brain does not get enough oxygen. It is a medical emergency that must be treated right away. An ischemic stroke can cause permanent loss of brain function. This can cause problems with how different parts of your body function. What are the causes? This condition is caused by a decrease of oxygen supply to an area of the brain, which may be the result of:  A small blood clot (embolus) or a buildup of plaque in the blood vessels (atherosclerosis) that blocks blood flow in the brain.  An abnormal heart rhythm (atrial fibrillation).  A blocked or damaged artery in the head or neck.  What increases the risk? Certain factors may make you more likely to develop this condition. Some of these factors are things that you can change, such as:  Obesity.  Smoking cigarettes.  Taking oral birth control, especially if you also use tobacco.  Physical inactivity.  Excessive alcohol use.  Use of illegal drugs, especially cocaine and methamphetamine.  Other risk factors include:  High blood pressure  (hypertension).  High cholesterol.  Diabetes mellitus.  Heart disease.  Being [place] American, Native American, Hispanic, or [place] Native.  Being over age 53.  Family history of stroke.  Previous history of blood clots, stroke, or transient ischemic attack (TIA).  Sickle cell disease.  Being a woman with a history of preeclampsia.  Migraine headache.  Sleep apnea.  Irregular heartbeats, such as atrial fibrillation.  Chronic inflammatory diseases, such as rheumatoid arthritis or lupus.  Blood clotting disorders (hypercoagulable state).  What are the signs or symptoms? Symptoms of this condition usually develop suddenly, or you may notice them after waking up from sleep. Symptoms may  include sudden:  Weakness or numbness in your face, arm, or leg, especially on one side of your body.  Trouble walking or difficulty moving your arms or legs.  Loss of balance or coordination.  Confusion.  Slurred speech (dysarthria).  Trouble speaking, understanding speech, or both (aphasia).  Vision changes--such as double vision, blurred vision, or loss of vision--inone or both eyes.  Dizziness.  Nausea and vomiting.  Severe headache with no known cause. The headache is often described as the worst headache ever experienced.  If possible, make note of the exact time that you last felt like your normal self and what time your symptoms started. Tell your health care provider. If symptoms come and go, this could be a sign of a warning stroke, or TIA. Get help right away, even if you feel better. How is this diagnosed? This condition may be diagnosed based on:  Your symptoms, your medical history, and a physical exam.  CT scan of the brain.  MRI.  CT angiogram. This test uses a computer to take X-rays of your arteries. A dye may be injected into your blood to show the inside of your blood vessels more clearly.  MRI angiogram. This is a type of MRI that is used to evaluate  the blood vessels.  Cerebral angiogram. This test uses X-rays and a dye to show the blood vessels in the brain and neck.  You may need to see a health care provider who specializes in stroke care. A stroke specialist can be seen in person or through communication using telephone or television technology (telemedicine). Other tests may also be done to find the cause of the stroke, such as:  Electrocardiogram (ECG).  Continuous heart monitoring.  Echocardiogram.  Carotid ultrasound.  A scan of the brain circulation.  Blood tests.  Sleep study to check for sleep apnea.  How is this treated? Treatment for this condition will depend on the duration, severity, and cause of your symptoms and on the area of the brain affected. It is very important to get treatment at the first sign of stroke symptoms. Some treatments work better if they are done within 3-6 hours of the onset of stroke symptoms. These initial treatments may include:  Aspirin.  Medicines to control blood pressure.  Medicine given by injection to dissolve the blood clot (thrombolytic).  Treatments given directly to the affected artery to remove or dissolve the blood clot.  Other treatment options may include:  Oxygen.  IV fluids.  Medicines to thin the blood (anticoagulants or antiplatelets).  Procedures to increase blood flow.  Medicines and changes to your diet may be used to help treat and manage risk factors for stroke, such as diabetes, high cholesterol, and high blood pressure. After a stroke, you may work with physical, speech, mental health, or occupational therapists to help you recover. Follow these instructions at home: Medicines  Take over-the-counter and prescription medicines only as told by your health care provider.  If you were told to take a medicine to thin your blood, such as aspirin or an anticoagulant, take it exactly as told by your health care provider. ? Taking too much blood-thinning  medicine can cause bleeding. ? If you do not take enough blood-thinning medicine, you will not have the protection that you need against another stroke and other problems.  Understand the side effects of taking anticoagulant medicine. When taking this type of medicine, make sure you: ? Hold pressure over any cuts for longer than usual. ? Tell  your dentist and other health care providers that you are taking anticoagulants before you have any procedures that may cause bleeding. ? Avoid activities that may cause trauma or injury. Eating and drinking  Follow instructions from your health care provider about diet.  Eat healthy foods.  If your ability to swallow was affected by the stroke, you may need to take steps to avoid choking, such as: ? Taking small bites when eating. ? Eating foods that are soft or pureed. Safety  Follow instructions from your health care team about physical activity.  Use a walker or cane as told by your health care provider.  Take steps to create a safe home environment in order to reduce the risk of falls. This may include: ? Having your home looked at by specialists. ? Installing grab bars in the bedroom and bathroom. ? Using safety equipment, such as raised toilets and a seat in the shower. General instructions  Do not use any tobacco products, such as cigarettes, chewing tobacco, and e-cigarettes. If you need help quitting, ask your health care provider.  Limit alcohol intake to no more than 1 drink a day for nonpregnant women and 2 drinks a day for men. One drink equals 12 oz of beer, 5 oz of wine, or 1 oz of hard liquor.  If you need help to stop using drugs or alcohol, ask your health care provider about a referral to a program or specialist.  Maintain an active and healthy lifestyle. Get regular exercise as told by your health care provider.  Keep all follow-up visits as told by your health care provider, including visits with all specialists on your  health care team. This is important. How is this prevented? Your risk of another stroke can be decreased by managing high blood pressure, high cholesterol, diabetes, heart disease, sleep apnea, and obesity. It can also be decreased by quitting smoking, limiting alcohol, and staying physically active. Your health care provider will continue to work with you on measures to prevent short-term and long-term complications of stroke. Get help right away if: You have:  Sudden weakness or numbness in your face, arm, or leg, especially on one side of your body.  Sudden confusion.  Sudden trouble speaking, understanding, or both (aphasia).  Sudden trouble seeing with one or both eyes.  Sudden trouble walking or difficulty moving your arms or legs.  Sudden dizziness.  Sudden loss of balance or coordination.  Sudden, severe headache with no known cause.  A partial or total loss of consciousness.  A seizure. Any of these symptoms may represent a serious problem that is an emergency. Do not wait to see if the symptoms will go away. Get medical help right away. Call your local emergency services (911 in [place] Do not drive yourself to the hospital. This information is not intended to replace advice given to you by your health care provider. Make sure you discuss any questions you have with your health care provider. Document Released: 09/08/2005 Document Revised: 02/19/2016 Document Reviewed: 12/05/2015 Elsevier Interactive Patient Education  2017 [employer].

## 2017-02-21 LAB — HEMOGLOBIN A1C
HEMOGLOBIN A1C: 5.7 % — AB (ref 4.8–5.6)
MEAN PLASMA GLUCOSE: 117 mg/dL

## 2017-02-23 ENCOUNTER — Telehealth: Payer: Self-pay | Admitting: *Deleted

## 2017-02-23 NOTE — Telephone Encounter (Signed)
Patient contacted regarding discharge from [facility] on 02/20/17.   Patient understands to follow up with provider ? On 02/20/17 at 09:30 am  at [facility] with Ignacia Bayley, NP.  Patient understands discharge instructions? Yes Patient understands medications and regiment? Yes Patient understands to bring all medications to this visit? Yes

## 2017-03-10 ENCOUNTER — Ambulatory Visit (INDEPENDENT_AMBULATORY_CARE_PROVIDER_SITE_OTHER): Payer: Medicare Other | Admitting: Nurse Practitioner

## 2017-03-10 ENCOUNTER — Encounter: Payer: Self-pay | Admitting: Nurse Practitioner

## 2017-03-10 VITALS — BP 116/62 | HR 73 | Ht 62.0 in | Wt 111.2 lb

## 2017-03-10 DIAGNOSIS — E785 Hyperlipidemia, unspecified: Secondary | ICD-10-CM

## 2017-03-10 DIAGNOSIS — I639 Cerebral infarction, unspecified: Secondary | ICD-10-CM

## 2017-03-10 DIAGNOSIS — I255 Ischemic cardiomyopathy: Secondary | ICD-10-CM | POA: Diagnosis not present

## 2017-03-10 DIAGNOSIS — I10 Essential (primary) hypertension: Secondary | ICD-10-CM

## 2017-03-10 DIAGNOSIS — I2119 ST elevation (STEMI) myocardial infarction involving other coronary artery of inferior wall: Secondary | ICD-10-CM | POA: Diagnosis not present

## 2017-03-10 DIAGNOSIS — I6381 Other cerebral infarction due to occlusion or stenosis of small artery: Secondary | ICD-10-CM

## 2017-03-10 MED ORDER — NITROGLYCERIN 0.4 MG SL SUBL: 0.4000 mg | SUBLINGUAL_TABLET | SUBLINGUAL | 4 refills | Status: DC | PRN

## 2017-03-10 MED ORDER — ATORVASTATIN CALCIUM 40 MG PO TABS: 40.0000 mg | ORAL_TABLET | Freq: Every day | ORAL | 3 refills | Status: DC

## 2017-03-10 MED ORDER — METOPROLOL TARTRATE 25 MG PO TABS: 12.5000 mg | ORAL_TABLET | Freq: Two times a day (BID) | ORAL | 3 refills | Status: DC

## 2017-03-10 MED ORDER — CLOPIDOGREL BISULFATE 75 MG PO TABS: 75.0000 mg | ORAL_TABLET | Freq: Every day | ORAL | 3 refills | Status: DC

## 2017-03-10 NOTE — Patient Instructions (Signed)
Medication Instructions:  Your physician recommends that you continue on your current medications as directed. Please refer to the Current Medication list given to you today. 1- AS NEEDED FOR CHEST PAIN: NITROGLYCERIN 0.4 mg (1 tablet) under the tongue every 5 minutes as needed for chest pain for a maximum of 3 doses.  If a single episode of chest pain is not relieved by one tablet, the patient will try another within 5 minutes; you may take another tablet every 5 minutes for maximum of 3 doses, and if this doesn't relieve the pain, the patient is instructed to call 911 for transportation to an emergency department.   Labwork: none  Testing/Procedures: none  Follow-Up: Your physician recommends that you schedule a follow-up appointment in: 3 MONTHS WITH DR END.  You have been referred to [facility]. If you have not heard from representative at [facility] over the next week, please call [phone].   If you need a refill on your cardiac medications before your next appointment, please call your pharmacy.

## 2017-03-10 NOTE — Progress Notes (Signed)
Office Visit    Patient Name: Bianca Taylor Date of Encounter: 03/10/2017  Primary Care Provider:  [name], MD Primary Cardiologist:  Andree Coss, MD   Chief Complaint    83 year old female with history of hypertension, hyperlipidemia, breast cancer, and GERD, who was recently admitted to [facility] regional in the setting of inferior STEMI and also acute stroke. She presents for follow-up today.  Past Medical History    Past Medical History:  Diagnosis Date  . Breast cancer ([place])    left 2013  . COPD (chronic obstructive pulmonary disease) ([place])   . GERD (gastroesophageal reflux disease)   . Hypercholesterolemia   . Hypertension   . Ischemic cardiomyopathy    a. 01/2017 Echo: EF 45-50%, mod inflat and inf HK, Gr1 DD.  Marland Kitchen ST elevation myocardial infarction (STEMI) of inferior wall ([place])    a. 01/2017 in setting of acute stroke-->medically managed.  . Stroke ([place])    a. 01/2017 MRI: 1 cm acute ischemic nonhemorrhagic lacunar type infarct involving the left lentiform nucleus. Mod cerebral atrophy, chronic microvascular isch dzs, multiple scattered remote infarcts, extensive chrnoic micro hemorrhages;  b. 01/2016 CTA head/neck: no acute IC/EC stenosis or dissection.   Past Surgical History:  Procedure Laterality Date  . BRAIN SURGERY    . BREAST BIOPSY Left    positive 03/2012  . BREAST BIOPSY Bilateral    negative 2000  . BREAST EXCISIONAL BIOPSY Left    positive 04/2012  . BREAST MAMMOSITE Left   . CATARACT EXTRACTION      Allergies  Allergies  Allergen Reactions  . Codeine     Other reaction(s): Dizziness  . Fluocinolone Nausea And Vomiting  . Moxifloxacin     Other reaction(s): Vomiting  . Pravastatin     Other reaction(s): Muscle Pain  . Risedronate Other (See Comments)    Bone pain  . Tetracyclines & Related Nausea And Vomiting  . Niacin Rash    History of Present Illness    83 year old female with the above past medical history including  hypertension, hyperlipidemia, GERD, COPD, and breast cancer. She recently presented to [facility] regional with aphasia and. She reported nausea and shortness of breath 2 nights prior to presentation. Her ECG showed inferior ST segment elevation. Given neurologic symptoms, concern for acute stroke, lack of chest pain with presumed late presentation, decision was made to medically manage her from a cardiac standpoint. Troponin was 15.07 on presentation.  MRI did show acute stroke as outlined in [place].  CTA did not show IC/EC stenosis.  Echo showed mild LV dysfxn w/ EF of 45-50%.  She was placed on DAPT,  blocker, and statin therapy.  Since her discharge, she has done reasonably well. She is steadily recovering from her stroke and has had resolution of aphasia. She continues to have some trouble swallowing especially large pills. She is working with physical and speech therapy. She has not had any chest pain. At higher levels of activity during physical therapy, her husband has noted some dyspnea on exertion, though not significant or profound dyspnea. She denies PND, orthopnea, palpitations, dizziness, syncope, edema, or early satiety.   Home Medications    Prior to Admission medications   Medication Sig Start Date End Date Taking? Authorizing Provider  acetaminophen (TYLENOL) 325 MG tablet Take 2 tablets (650 mg total) by mouth every 6 (six) hours as needed for mild pain (or Fever >/= 101). 02/20/17   Nicholes Mango, MD  aspirin EC 81  MG tablet Take 81 mg by mouth at bedtime.     [provider]  atorvastatin (LIPITOR) 40 MG tablet Take 1 tablet (40 mg total) by mouth daily at 6 PM. 02/20/17   Gouru, Aruna, MD  butalbital-acetaminophen-caffeine (FIORICET, ESGIC) 50-325-40 MG tablet Take 1 tablet by mouth every 4 (four) hours as needed for headache.     [provider]  clopidogrel (PLAVIX) 75 MG tablet Take 1 tablet (75 mg total) by mouth daily. 02/21/17   Nicholes Mango, MD  cyclobenzaprine  (FLEXERIL) 10 MG tablet Take 5-10 mg by mouth 2 (two) times daily as needed for muscle spasms.    [provider]  metoprolol tartrate (LOPRESSOR) 25 MG tablet Take 0.5 tablets (12.5 mg total) by mouth 2 (two) times daily. 02/20/17   Nicholes Mango, MD  pravastatin (PRAVACHOL) 40 MG tablet Take 40 mg by mouth at bedtime.    [provider]  tamoxifen (NOLVADEX) 20 MG tablet Take 20 mg by mouth at bedtime.     [provider]  telmisartan (MICARDIS) 80 MG tablet Take 80 mg by mouth at bedtime.    [provider]  traMADol (ULTRAM) 50 MG tablet Take 1 tablet (50 mg total) by mouth every 6 (six) hours as needed for moderate pain. 01/04/17   [name], PA-C  zolpidem (AMBIEN) 5 MG tablet Take 5 mg by mouth at bedtime as needed for sleep.    [provider]    Review of Systems    As above, she has been doing well and recovering well from her recent stroke. She has not been having any chest pain or significant dyspnea, palpitations, PND, orthopnea, dizziness, syncope, edema, or early satiety.  All other systems reviewed and are otherwise negative except as noted above.  Physical Exam    VS:  BP 116/62 (BP Location: Right Arm, Patient Position: Sitting, Cuff Size: Normal)   Pulse 73   Ht 5\' 2"  (1.575 m)   Wt 111 lb 4 oz (50.5 kg)   BMI 20.35 kg/m  , BMI Body mass index is 20.35 kg/m. GEN: Well nourished, well developed, in no acute distress.  HEENT: normal.  Neck: Supple, no JVD, carotid bruits, or masses. Cardiac: RRR, no murmurs, rubs, or gallops. No clubbing, cyanosis, edema.  Radials/DP/PT 2+ and equal bilaterally.  Respiratory:  Respirations regular and unlabored, clear to auscultation bilaterally. GI: Soft, nontender, nondistended, BS + x 4. MS: no deformity or atrophy. Skin: warm and dry, no rash. Neuro:  Strength and sensation are intact. Psych: Normal affect.  Accessory Clinical Findings   Regular sinus rhythm, 73, left axis  deviation, inferior infarct with inferior and anterolateral T-wave inversion. She has mild inferior ST segment elevation, which is improved since her hospitalization. Patient has been provided with a copy of this ECG.  Assessment & Plan    1.  Inferior ST segment elevation myocardial infarction, subsequent episode of care: Patient is status post recent inferior STEMI in the setting of lacunar stroke. MRI symptoms likely occurred 2 days prior to ER visit and included nausea, chest tightness, and dyspnea. In the setting of stroke, she was conservatively managed from a cardiac standpoint. Echo showed mild LV dysfunction, with an EF of 4550% and moderate inferolateral and inferior hypokinesis. She has been medically managed and tolerating therapy well. She remains on aspirin, statin, Plavix, ARB, and beta blocker therapy. She has not been having any chest pain. She does have some dyspnea on exertion with higher levels  of activity in physical therapy. Her husband says he has never noted her to be in any distress. Both she and her husband are interested in referral for cardiac rehabilitation and we will make this referral today. I will also send in a prescription for sublingual nitroglycerin. In the setting of ongoing, subtle inferior ST elevation with inferior and anterolateral T-wave inversion, I have made a copy of her ECG for her to have with her in her pocketbook.  2. Essential hypertension: Blood pressure is stable today and beta blocker and ARB therapy.  3. Ischemic cardiomyopathy: EF 45-50% by echo with inferior and inferolateral hypokinesis. She is euvolemic on exam today. She remains on beta blocker and ARB therapy.  We discussed the importance of daily weights, sodium restriction, medication compliance, and symptom reporting and she verbalizes understanding.   4. Hyperlipidemia: LDL was 99 on May 31. Her Lipitor dose was increased to 40 mg daily. She just had follow-up lipids on June 18 and LDL was  86. I would plan to follow-up lipids in a few months and determine if a need to escalate her statin dose or add Zetia.  5. Lacunar stroke: She is recovering well and continues to receive home health physical and speech therapy. She remains on aspirin, statin, and Plavix therapy.  6. Disposition: Patient is doing reasonably well from a cardiac standpoint and will follow-up with Dr. Saunders Revel in 2 mos or sooner if necessary.  Murray Hodgkins, NP 03/10/2017, 1:10 PM

## 2017-03-11 DIAGNOSIS — I2510 Atherosclerotic heart disease of native coronary artery without angina pectoris: Secondary | ICD-10-CM | POA: Insufficient documentation

## 2017-06-10 ENCOUNTER — Encounter: Payer: Self-pay | Admitting: Internal Medicine

## 2017-06-10 ENCOUNTER — Ambulatory Visit (INDEPENDENT_AMBULATORY_CARE_PROVIDER_SITE_OTHER): Payer: Medicare Other | Admitting: Internal Medicine

## 2017-06-10 VITALS — BP 112/68 | HR 66 | Ht 62.0 in | Wt 110.0 lb

## 2017-06-10 DIAGNOSIS — E785 Hyperlipidemia, unspecified: Secondary | ICD-10-CM | POA: Diagnosis not present

## 2017-06-10 DIAGNOSIS — Z8673 Personal history of transient ischemic attack (TIA), and cerebral infarction without residual deficits: Secondary | ICD-10-CM | POA: Insufficient documentation

## 2017-06-10 DIAGNOSIS — I2510 Atherosclerotic heart disease of native coronary artery without angina pectoris: Secondary | ICD-10-CM

## 2017-06-10 NOTE — Progress Notes (Signed)
Follow-up Outpatient Visit Date: 06/10/2017  Primary Care Provider: [name], MD [facility] [place] [address]  Chief Complaint: Follow-up coronary artery disease  HPI:  Ms. Heidemann is a 83 y.o. year-old female with history of coronary artery disease with inferior STEMI in the setting of acute stroke in 01/2017, hypertension, hyperlipidemia, breast cancer, COPD, and GERD, who presents for follow-up of coronary artery disease. Her STEMI in the late May was medically managed. Ms. Toure was seen in follow-up by Ignacia Bayley, NP, in June, at which time she was recovering from her stroke. She continued to have some difficulty swallowing large pills. She had not had any chest pain, though some exertional dyspnea was noted.  Today, Ms. Hiltunen reports feeling well. She has not had any chest pain or significant shortness of breath. Her exertional dyspnea is less pronounced that at her prior visit. She does not have any remaining neurologic deficits. She denies right headedness and palpitations. She had a single episode of "indigestion" that promptly resolved with an over-the-counter antacid. She has not heard from cardiac rehabilitation yet but is interested in participating.  --------------------------------------------------------------------------------------------------  Past Medical History:  Diagnosis Date  . Breast cancer ([place])    left 2013  . COPD (chronic obstructive pulmonary disease) ([place])   . GERD (gastroesophageal reflux disease)   . Hypercholesterolemia   . Hypertension   . Ischemic cardiomyopathy    a. 01/2017 Echo: EF 45-50%, mod inflat and inf HK, Gr1 DD.  Marland Kitchen ST elevation myocardial infarction (STEMI) of inferior wall ([place])    a. 01/2017 in setting of acute stroke-->medically managed.  . Stroke ([place])    a. 01/2017 MRI: 1 cm acute ischemic nonhemorrhagic lacunar type infarct involving the left lentiform nucleus. Mod cerebral atrophy, chronic  microvascular isch dzs, multiple scattered remote infarcts, extensive chrnoic micro hemorrhages;  b. 01/2016 CTA head/neck: no acute IC/EC stenosis or dissection.   Past Surgical History:  Procedure Laterality Date  . BRAIN SURGERY    . BREAST BIOPSY Left    positive 03/2012  . BREAST BIOPSY Bilateral    negative 2000  . BREAST EXCISIONAL BIOPSY Left    positive 04/2012  . BREAST MAMMOSITE Left   . CATARACT EXTRACTION      Current Meds  Medication Sig  . acetaminophen (TYLENOL) 325 MG tablet Take 2 tablets (650 mg total) by mouth every 6 (six) hours as needed for mild pain (or Fever >/= 101).  Marland Kitchen aspirin EC 81 MG tablet Take 81 mg by mouth at bedtime.   Marland Kitchen atorvastatin (LIPITOR) 40 MG tablet Take 1 tablet (40 mg total) by mouth daily at 6 PM.  . butalbital-acetaminophen-caffeine (FIORICET, ESGIC) 50-325-40 MG tablet Take 1 tablet by mouth every 4 (four) hours as needed for headache.   . clopidogrel (PLAVIX) 75 MG tablet Take 1 tablet (75 mg total) by mouth daily.  . cyclobenzaprine (FLEXERIL) 10 MG tablet Take 5-10 mg by mouth 2 (two) times daily as needed for muscle spasms.  . metoprolol tartrate (LOPRESSOR) 25 MG tablet Take 0.5 tablets (12.5 mg total) by mouth 2 (two) times daily.  . nitroGLYCERIN (NITROSTAT) 0.4 MG SL tablet Place 1 tablet (0.4 mg total) under the tongue every 5 (five) minutes as needed for chest pain. Maximum of 3 doses.  . tamoxifen (NOLVADEX) 20 MG tablet Take 20 mg by mouth at bedtime.   Marland Kitchen telmisartan (MICARDIS) 80 MG tablet Take 80 mg by mouth at bedtime.  Marland Kitchen  zolpidem (AMBIEN) 5 MG tablet Take 5 mg by mouth at bedtime as needed for sleep.    Allergies: Codeine; Fluocinolone; Moxifloxacin; Pravastatin; Risedronate; Tetracyclines & related; and Niacin  Social History   Social History  . Marital status: Married    Spouse name: N/A  . Number of children: N/A  . Years of education: N/A   Occupational History  . Not on file.   Social History Main Topics  .  Smoking status: Never Smoker  . Smokeless tobacco: Never Used  . Alcohol use No  . Drug use: Unknown  . Sexual activity: Not on file   Other Topics Concern  . Not on file   Social History Narrative  . No narrative on file    Family History  Problem Relation Age of Onset  . Breast cancer Sister 68    Review of Systems: A 12-system review of systems was performed and was negative except as noted in the HPI.  --------------------------------------------------------------------------------------------------  Physical Exam: BP 112/68 (BP Location: Left Arm, Patient Position: Sitting, Cuff Size: Normal)   Pulse 66   Ht 5\' 2"  (1.575 m)   Wt 110 lb (49.9 kg)   BMI 20.12 kg/m   General:  Thin elderly woman seated comfortably in the exam room. She is accompanied by her husband HEENT: No conjunctival pallor or scleral icterus. Moist mucous membranes.  OP clear. Neck: Supple without lymphadenopathy, thyromegaly, JVD, or HJR.  Lungs: Normal work of breathing. Clear to auscultation bilaterally without wheezes or crackles. Heart: Regular rate and rhythm without murmurs, rubs, or gallops. Non-displaced PMI. Abd: Bowel sounds present. Soft, NT/ND without hepatosplenomegaly Ext: No lower extremity edema. Radial, PT, and DP pulses are 2+ bilaterally. Skin: Warm and dry without rash.  EKG:  Normal sinus rhythm with inferior Q waves. Inferior T-wave inversions are less pronounced. Anterolateral T-wave inversions have resolved.  Lab Results  Component Value Date   WBC 9.5 02/20/2017   HGB 13.0 02/20/2017   HCT 38.2 02/20/2017   MCV 89.7 02/20/2017   PLT 394 02/20/2017    Lab Results  Component Value Date   NA 138 02/20/2017   K 3.8 02/20/2017   CL 104 02/20/2017   CO2 28 02/20/2017   BUN 14 02/20/2017   CREATININE 0.72 02/20/2017   GLUCOSE 106 (H) 02/20/2017   ALT 23 02/18/2017    Lab Results  Component Value Date   CHOL 171 02/19/2017   HDL 49 02/19/2017   LDLCALC 99  02/19/2017   TRIG 114 02/19/2017   CHOLHDL 3.5 02/19/2017    --------------------------------------------------------------------------------------------------  ASSESSMENT AND PLAN: Coronary artery disease without angina Ms. Brekke has recovered well from her inferior STEMI in late May, which was treated medically. We will continue with aspirin and clopidogrel for 12 months. We will also continue aggressive secondary prevention low-dose metoprolol and high-intensity statin therapy. I have referred Ms. Orzel to cardiac rehabilitation again today.  History of stroke No new neurologic deficits. Prior aphasia and dysphagia have resolved. Continue with aspirin and clopidogrel well as atorvastatin.  Hyperlipidemia Goal LDL is less than 70. As it has been 3 months since her last panel, we will recheck a fasting lipid panel at her convenience.  Follow-up: Return to clinic in 3 months.  Nelva Bush, MD 06/10/2017 11:19 AM

## 2017-06-10 NOTE — Patient Instructions (Signed)
Medication Instructions:  Your physician recommends that you continue on your current medications as directed. Please refer to the Current Medication list given to you today.   Labwork: Your physician recommends that you return for lab work in: AT [place] To check your cholesterol. - You will need to be fasting. Do not eat or drink after midnight the morning you have lab work.  - Please go to the [facility]. You will check in at the front desk to the right as you walk into the atrium. Valet Parking is offered if needed. - No appointment needed.    Testing/Procedures: none  Follow-Up: You have been referred to Cardiac Rehab. Please call [phone] to schedule.  Your physician recommends that you schedule a follow-up appointment in: 3 MONTHS WITH DR END.   If you need a refill on your cardiac medications before your next appointment, please call your pharmacy.

## 2017-06-25 ENCOUNTER — Encounter: Payer: Self-pay | Admitting: *Deleted

## 2017-06-25 ENCOUNTER — Encounter: Payer: Medicare Other | Attending: Internal Medicine | Admitting: *Deleted

## 2017-06-25 VITALS — Ht 61.25 in | Wt 109.1 lb

## 2017-06-25 DIAGNOSIS — K219 Gastro-esophageal reflux disease without esophagitis: Secondary | ICD-10-CM | POA: Insufficient documentation

## 2017-06-25 DIAGNOSIS — I213 ST elevation (STEMI) myocardial infarction of unspecified site: Secondary | ICD-10-CM

## 2017-06-25 DIAGNOSIS — I2119 ST elevation (STEMI) myocardial infarction involving other coronary artery of inferior wall: Secondary | ICD-10-CM | POA: Diagnosis not present

## 2017-06-25 DIAGNOSIS — I10 Essential (primary) hypertension: Secondary | ICD-10-CM | POA: Insufficient documentation

## 2017-06-25 DIAGNOSIS — M797 Fibromyalgia: Secondary | ICD-10-CM | POA: Insufficient documentation

## 2017-06-25 NOTE — Progress Notes (Signed)
Daily Session Note  Patient Details  Name: Bianca Taylor MRN: [id number] Date of Birth: Feb 08, 1930 Referring Provider:     Cardiac Rehab from 06/25/2017 in [facility]  Referring Provider  End      Encounter Date: 06/25/2017  Check In:     Session Check In - 06/25/17 1422      Check-In   Location [facility]-Cardiac & Pulmonary Rehab   Staff Present Renita Papa, RN Vickki Hearing, BA, ACSM CEP, Exercise Physiologist;Krista Frederico Hamman, RN BSN   Supervising physician immediately available to respond to emergencies See telemetry face sheet for immediately available ER MD   Medication changes reported     No   Fall or balance concerns reported    No   Warm-up and Cool-down Performed as group-led instruction   Resistance Training Performed Yes   VAD Patient? No     Pain Assessment   Currently in Pain? No/denies           Exercise Prescription Changes - 06/25/17 1400      Response to Exercise   Blood Pressure (Admit) 108/62   Blood Pressure (Exercise) 128/66   Blood Pressure (Exit) 110/68   Heart Rate (Admit) 59 bpm   Heart Rate (Exercise) 80 bpm   Heart Rate (Exit) 61 bpm   Oxygen Saturation (Admit) 95 %   Rating of Perceived Exertion (Exercise) 11      History  Smoking Status  . Never Smoker  Smokeless Tobacco  . Never Used    Goals Met:  Proper associated with RPD/PD & O2 Sat Exercise tolerated well Personal goals reviewed No report of cardiac concerns or symptoms Strength training completed today  Goals Unmet:  Not Applicable  Comments: Med Review Completed   Dr. Emily Filbert is Medical Director for [facility] and [facility].

## 2017-06-25 NOTE — Progress Notes (Signed)
Cardiac Individual Treatment Plan  Patient Details  Name: Bianca Taylor MRN: [id number] Date of Birth: 1930/06/20 Referring Provider:     Cardiac Rehab from 06/25/2017 in [facility] Cardiac and Pulmonary Rehab  Referring Provider  End      Initial Encounter Date:    Cardiac Rehab from 06/25/2017 in [facility] Cardiac and Pulmonary Rehab  Date  06/25/17  Referring Provider  End      Visit Diagnosis: ST elevation myocardial infarction (STEMI), unspecified artery ([place])  Patient's Home Medications on Admission:  Current Outpatient Prescriptions:  .  acetaminophen (TYLENOL) 325 MG tablet, Take 2 tablets (650 mg total) by mouth every 6 (six) hours as needed for mild pain (or Fever >/= 101)., Disp: , Rfl:  .  aspirin EC 81 MG tablet, Take 81 mg by mouth at bedtime. , Disp: , Rfl:  .  atorvastatin (LIPITOR) 40 MG tablet, Take 1 tablet (40 mg total) by mouth daily at 6 PM., Disp: 90 tablet, Rfl: 3 .  butalbital-acetaminophen-caffeine (FIORICET, ESGIC) 50-325-40 MG tablet, Take 1 tablet by mouth every 4 (four) hours as needed for headache. , Disp: , Rfl:  .  clopidogrel (PLAVIX) 75 MG tablet, Take 1 tablet (75 mg total) by mouth daily., Disp: 90 tablet, Rfl: 3 .  cyclobenzaprine (FLEXERIL) 10 MG tablet, Take 5-10 mg by mouth 2 (two) times daily as needed for muscle spasms., Disp: , Rfl:  .  metoprolol tartrate (LOPRESSOR) 25 MG tablet, Take 0.5 tablets (12.5 mg total) by mouth 2 (two) times daily., Disp: 90 tablet, Rfl: 3 .  tamoxifen (NOLVADEX) 20 MG tablet, Take 20 mg by mouth at bedtime. , Disp: , Rfl:  .  telmisartan (MICARDIS) 80 MG tablet, Take 80 mg by mouth at bedtime., Disp: , Rfl:  .  zolpidem (AMBIEN) 5 MG tablet, Take 5 mg by mouth at bedtime as needed for sleep., Disp: , Rfl:  .  zolpidem (AMBIEN) 5 MG tablet, TAKE ONE TABLET BY MOUTH AT BEDTIME AS NEEDED FOR SLEEP, Disp: , Rfl:  .  nitroGLYCERIN (NITROSTAT) 0.4 MG SL tablet, Place 1 tablet (0.4 mg total) under the tongue every 5 (five)  minutes as needed for chest pain. Maximum of 3 doses., Disp: 25 tablet, Rfl: 4  Past Medical History: Past Medical History:  Diagnosis Date  . Breast cancer ([place])    left 2013  . COPD (chronic obstructive pulmonary disease) ([place])   . GERD (gastroesophageal reflux disease)   . Hypercholesterolemia   . Hypertension   . Ischemic cardiomyopathy    a. 01/2017 Echo: EF 45-50%, mod inflat and inf HK, Gr1 DD.  Marland Kitchen ST elevation myocardial infarction (STEMI) of inferior wall ([place])    a. 01/2017 in setting of acute stroke-->medically managed.  . Stroke ([place])    a. 01/2017 MRI: 1 cm acute ischemic nonhemorrhagic lacunar type infarct involving the left lentiform nucleus. Mod cerebral atrophy, chronic microvascular isch dzs, multiple scattered remote infarcts, extensive chrnoic micro hemorrhages;  b. 01/2016 CTA head/neck: no acute IC/EC stenosis or dissection.    Tobacco Use: History  Smoking Status  . Never Smoker  Smokeless Tobacco  . Never Used    Labs: Recent Review Flowsheet Data    Labs for ITP Cardiac and Pulmonary Rehab Latest Ref Rng & Units 02/19/2017 02/20/2017   Cholestrol 0 - 200 mg/dL 171 -   LDLCALC 0 - 99 mg/dL 99 -   HDL >40 mg/dL 49 -   Trlycerides <150 mg/dL 114 -   Hemoglobin A1c  4.8 - 5.6 % 5.8(H) 5.7(H)       Exercise Target Goals: Date: 06/25/17  Exercise Program Goal: Individual exercise prescription set with THRR, safety & activity barriers. Participant demonstrates ability to understand and report RPE using BORG scale, to self-measure pulse accurately, and to acknowledge the importance of the exercise prescription.  Exercise Prescription Goal: Starting with aerobic activity 30 plus minutes a day, 3 days per week for initial exercise prescription. Provide home exercise prescription and guidelines that participant acknowledges understanding prior to discharge.  Activity Barriers & Risk Stratification:     Activity Barriers & Cardiac Risk Stratification - 06/25/17  1449      Activity Barriers & Cardiac Risk Stratification   Activity Barriers None   Cardiac Risk Stratification Moderate      6 Minute Walk:     6 Minute Walk    Row Name 06/25/17 1427         6 Minute Walk   Distance 755 feet     Walk Time 6 minutes     # of Rest Breaks 0     MPH 1.42     METS 1.14     RPE 11     VO2 Peak 4     Symptoms No     Resting HR 57 bpm     Resting BP 108/62     Resting Oxygen Saturation  95 %     Exercise Oxygen Saturation  during 6 min walk 95 %     Max Ex. HR 80 bpm     Max Ex. BP 128/66     2 Minute Post BP 110/68        Oxygen Initial Assessment:   Oxygen Re-Evaluation:   Oxygen Discharge (Final Oxygen Re-Evaluation):   Initial Exercise Prescription:     Initial Exercise Prescription - 06/25/17 1400      Date of Initial Exercise RX and Referring Provider   Date 06/25/17   Referring Provider End     Treadmill   MPH 1   Grade 0   Minutes 15   METs 1.77     NuStep   Level 1   SPM 80   Minutes 15   METs 1.4     Biostep-RELP   Level 1   SPM 50   Minutes 15   METs 1.4     Prescription Details   Frequency (times per week) 3   Duration Progress to 45 minutes of aerobic exercise without signs/symptoms of physical distress     Intensity   THRR 40-80% of Max Heartrate 88-119   Ratings of Perceived Exertion 11-13   Perceived Dyspnea 0-4     Resistance Training   Training Prescription Yes   Weight 2 lb   Reps 10-15      Perform Capillary Blood Glucose checks as needed.  Exercise Prescription Changes:     Exercise Prescription Changes    Row Name 06/25/17 1400             Response to Exercise   Blood Pressure (Admit) 108/62       Blood Pressure (Exercise) 128/66       Blood Pressure (Exit) 110/68       Heart Rate (Admit) 59 bpm       Heart Rate (Exercise) 80 bpm       Heart Rate (Exit) 61 bpm       Oxygen Saturation (Admit) 95 %       Rating of Perceived  Exertion (Exercise) 11           Exercise Comments:   Exercise Goals and Review:     Exercise Goals    Row Name 06/25/17 1426             Exercise Goals   Increase Physical Activity Yes       Intervention Provide advice, education, support and counseling about physical activity/exercise needs.;Develop an individualized exercise prescription for aerobic and resistive training based on initial evaluation findings, risk stratification, comorbidities and participant's personal goals.       Expected Outcomes Achievement of increased cardiorespiratory fitness and enhanced flexibility, muscular endurance and strength shown through measurements of functional capacity and personal statement of participant.       Increase Strength and Stamina Yes       Intervention Provide advice, education, support and counseling about physical activity/exercise needs.;Develop an individualized exercise prescription for aerobic and resistive training based on initial evaluation findings, risk stratification, comorbidities and participant's personal goals.       Expected Outcomes Achievement of increased cardiorespiratory fitness and enhanced flexibility, muscular endurance and strength shown through measurements of functional capacity and personal statement of participant.       Able to understand and use rate of perceived exertion (RPE) scale Yes       Intervention Provide education and explanation on how to use RPE scale       Expected Outcomes Short Term: Able to use RPE daily in rehab to express subjective intensity level;Long Term:  Able to use RPE to guide intensity level when exercising independently       Able to understand and use Dyspnea scale Yes       Intervention Provide education and explanation on how to use Dyspnea scale       Expected Outcomes Short Term: Able to use Dyspnea scale daily in rehab to express subjective sense of shortness of breath during exertion;Long Term: Able to use Dyspnea scale to guide intensity level when  exercising independently       Knowledge and understanding of Target Heart Rate Range (THRR) Yes       Intervention Provide education and explanation of THRR including how the numbers were predicted and where they are located for reference       Expected Outcomes Short Term: Able to state/look up THRR;Long Term: Able to use THRR to govern intensity when exercising independently;Short Term: Able to use daily as guideline for intensity in rehab       Able to check pulse independently Yes       Intervention Provide education and demonstration on how to check pulse in carotid and radial arteries.;Review the importance of being able to check your own pulse for safety during independent exercise       Expected Outcomes Short Term: Able to explain why pulse checking is important during independent exercise;Long Term: Able to check pulse independently and accurately       Understanding of Exercise Prescription Yes       Intervention Provide education, explanation, and written materials on patient's individual exercise prescription       Expected Outcomes Short Term: Able to explain program exercise prescription;Long Term: Able to explain home exercise prescription to exercise independently          Exercise Goals Re-Evaluation :   Discharge Exercise Prescription (Final Exercise Prescription Changes):     Exercise Prescription Changes - 06/25/17 1400      Response to Exercise   Blood  Pressure (Admit) 108/62   Blood Pressure (Exercise) 128/66   Blood Pressure (Exit) 110/68   Heart Rate (Admit) 59 bpm   Heart Rate (Exercise) 80 bpm   Heart Rate (Exit) 61 bpm   Oxygen Saturation (Admit) 95 %   Rating of Perceived Exertion (Exercise) 11      Nutrition:  Target Goals: Understanding of nutrition guidelines, daily intake of sodium 1500mg , cholesterol 200mg , calories 30% from fat and 7% or less from saturated fats, daily to have 5 or more servings of fruits and vegetables.  Biometrics:      Pre Biometrics - 06/25/17 1425      Pre Biometrics   Height 5' 1.25" (1.556 m)   Weight 109 lb 1.6 oz (49.5 kg)   Waist Circumference 29 inches   Hip Circumference 36 inches   Waist to Hip Ratio 0.81 %   BMI (Calculated) 20.44   Single Leg Stand 3.64 seconds       Nutrition Therapy Plan and Nutrition Goals:   Nutrition Discharge: Rate Your Plate Scores:     Nutrition Assessments - 06/25/17 1435      MEDFICTS Scores   Pre Score 27      Nutrition Goals Re-Evaluation:   Nutrition Goals Discharge (Final Nutrition Goals Re-Evaluation):   Psychosocial: Target Goals: Acknowledge presence or absence of significant depression and/or stress, maximize coping skills, provide positive support system. Participant is able to verbalize types and ability to use techniques and skills needed for reducing stress and depression.   Initial Review & Psychosocial Screening:     Initial Psych Review & Screening - 06/25/17 1443      Initial Review   Current issues with Current Stress Concerns   Source of Stress Concerns Unable to perform yard/household activities      Quality of Life Scores:      Quality of Life - 06/25/17 1446      Quality of Life Scores   Health/Function Pre 20.13 %   Socioeconomic Pre 20.4 %   Psych/Spiritual Pre 20.14 %   Family Pre 20.8 %   GLOBAL Pre 20.28 %      PHQ-9: Recent Review Flowsheet Data    Depression screen [facility] 2/9 06/25/2017 08/04/2016 08/06/2015   Decreased Interest 0 0 0   Down, Depressed, Hopeless 0 0 0   PHQ - 2 Score 0 0 0   Altered sleeping 0 - -   Tired, decreased energy 2 - -   Change in appetite 2 - -   Feeling bad or failure about yourself  1 - -   Trouble concentrating 1 - -   Moving slowly or fidgety/restless 0 - -   Suicidal thoughts 0 - -   PHQ-9 Score 6 - -     Interpretation of Total Score  Total Score Depression Severity:  1-4 = Minimal depression, 5-9 = Mild depression, 10-14 = Moderate depression, 15-19 =  Moderately severe depression, 20-27 = Severe depression   Psychosocial Evaluation and Intervention:   Psychosocial Re-Evaluation:   Psychosocial Discharge (Final Psychosocial Re-Evaluation):   Vocational Rehabilitation: Provide vocational rehab assistance to qualifying candidates.   Vocational Rehab Evaluation & Intervention:     Vocational Rehab - 06/25/17 1447      Initial Vocational Rehab Evaluation & Intervention   Assessment shows need for Vocational Rehabilitation No      Education: Education Goals: Education classes will be provided on a variety of topics geared toward better understanding of heart health and risk factor modification.  Participant will state understanding/return demonstration of topics presented as noted by education test scores.  Learning Barriers/Preferences:     Learning Barriers/Preferences - 06/25/17 1446      Learning Barriers/Preferences   Learning Barriers --  Some memory impairment   Learning Preferences Individual Instruction;Verbal Instruction      Education Topics: General Nutrition Guidelines/Fats and Fiber: -Group instruction provided by verbal, written material, models and posters to present the general guidelines for heart healthy nutrition. Gives an explanation and review of dietary fats and fiber.   Controlling Sodium/Reading Food Labels: -Group verbal and written material supporting the discussion of sodium use in heart healthy nutrition. Review and explanation with models, verbal and written materials for utilization of the food label.   Exercise Physiology & Risk Factors: - Group verbal and written instruction with models to review the exercise physiology of the cardiovascular system and associated critical values. Details cardiovascular disease risk factors and the goals associated with each risk factor.   Aerobic Exercise & Resistance Training: - Gives group verbal and written discussion on the health impact of  inactivity. On the components of aerobic and resistive training programs and the benefits of this training and how to safely progress through these programs.   Flexibility, Balance, General Exercise Guidelines: - Provides group verbal and written instruction on the benefits of flexibility and balance training programs. Provides general exercise guidelines with specific guidelines to those with heart or lung disease. Demonstration and skill practice provided.   Stress Management: - Provides group verbal and written instruction about the health risks of elevated stress, cause of high stress, and healthy ways to reduce stress.   Depression: - Provides group verbal and written instruction on the correlation between heart/lung disease and depressed mood, treatment options, and the stigmas associated with seeking treatment.   Anatomy & Physiology of the Heart: - Group verbal and written instruction and models provide basic cardiac anatomy and physiology, with the coronary electrical and arterial systems. Review of: AMI, Angina, Valve disease, Heart Failure, Cardiac Arrhythmia, Pacemakers, and the ICD.   Cardiac Procedures: - Group verbal and written instruction to review commonly prescribed medications for heart disease. Reviews the medication, class of the drug, and side effects. Includes the steps to properly store meds and maintain the prescription regimen. (beta blockers and nitrates)   Cardiac Medications I: - Group verbal and written instruction to review commonly prescribed medications for heart disease. Reviews the medication, class of the drug, and side effects. Includes the steps to properly store meds and maintain the prescription regimen.   Cardiac Medications II: -Group verbal and written instruction to review commonly prescribed medications for heart disease. Reviews the medication, class of the drug, and side effects. (all other drug classes)    Go Sex-Intimacy & Heart  Disease, Get SMART - Goal Setting: - Group verbal and written instruction through game format to discuss heart disease and the return to sexual intimacy. Provides group verbal and written material to discuss and apply goal setting through the application of the S.M.A.R.T. Method.   Other Matters of the Heart: - Provides group verbal, written materials and models to describe Heart Failure, Angina, Valve Disease, Peripheral Artery Disease, and Diabetes in the realm of heart disease. Includes description of the disease process and treatment options available to the cardiac patient.   Exercise & Equipment Safety: - Individual verbal instruction and demonstration of equipment use and safety with use of the equipment.   Cardiac Rehab from 06/25/2017 in [facility] Cardiac and  Pulmonary Rehab  Date  06/25/17  Educator  KS  Instruction Review Code  1- Verbalizes Understanding      Infection Prevention: - Provides verbal and written material to individual with discussion of infection control including proper hand washing and proper equipment cleaning during exercise session.   Cardiac Rehab from 06/25/2017 in [facility] Cardiac and Pulmonary Rehab  Date  06/25/17  Educator  KS  Instruction Review Code  1- Verbalizes Understanding      Falls Prevention: - Provides verbal and written material to individual with discussion of falls prevention and safety.   Cardiac Rehab from 06/25/2017 in [facility] Cardiac and Pulmonary Rehab  Date  06/25/17  Educator  KS  Instruction Review Code  1- Verbalizes Understanding      Diabetes: - Individual verbal and written instruction to review signs/symptoms of diabetes, desired ranges of glucose level fasting, after meals and with exercise. Acknowledge that pre and post exercise glucose checks will be done for 3 sessions at entry of program.   Other: -Provides group and verbal instruction on various topics (see comments)    Knowledge Questionnaire Score:     Knowledge  Questionnaire Score - 06/25/17 1447      Knowledge Questionnaire Score   Pre Score 26/28   correct answers reviewed with [name]       Core Components/Risk Factors/Patient Goals at Admission:     Personal Goals and Risk Factors at Admission - 06/25/17 1435      Core Components/Risk Factors/Patient Goals on Admission   Hypertension Yes   Intervention Provide education on lifestyle modifcations including regular physical activity/exercise, weight management, moderate sodium restriction and increased consumption of fresh fruit, vegetables, and low fat dairy, alcohol moderation, and smoking cessation.;Monitor prescription use compliance.   Expected Outcomes Short Term: Continued assessment and intervention until BP is < 140/33mm HG in hypertensive participants. < 130/71mm HG in hypertensive participants with diabetes, heart failure or chronic kidney disease.;Long Term: Maintenance of blood pressure at goal levels.   Lipids Yes   Intervention Provide education and support for participant on nutrition & aerobic/resistive exercise along with prescribed medications to achieve LDL 70mg , HDL >40mg .   Expected Outcomes Long Term: Cholesterol controlled with medications as prescribed, with individualized exercise RX and with personalized nutrition plan. Value goals: LDL < 70mg , HDL > 40 mg.      Core Components/Risk Factors/Patient Goals Review:    Core Components/Risk Factors/Patient Goals at Discharge (Final Review):    ITP Comments:     ITP Comments    Row Name 06/25/17 1423           ITP Comments Med review completed. Initial ITP created. Diagnosis can be found in [facility] 03/10/17          Comments: Initial ITP

## 2017-06-25 NOTE — Patient Instructions (Signed)
Patient Instructions  Patient Details  Name: Bianca Taylor MRN: [id number] Date of Birth: 03/14/1930 Referring Provider:  Nelva Bush, MD  Below are the personal goals you chose as well as exercise and nutrition goals. Our goal is to help you keep on track towards obtaining and maintaining your goals. We will be discussing your progress on these goals with you throughout the program.  Initial Exercise Prescription:     Initial Exercise Prescription - 06/25/17 1400      Date of Initial Exercise RX and Referring Provider   Date 06/25/17   Referring Provider End     Treadmill   MPH 1   Grade 0   Minutes 15   METs 1.77     NuStep   Level 1   SPM 80   Minutes 15   METs 1.4     Biostep-RELP   Level 1   SPM 50   Minutes 15   METs 1.4     Prescription Details   Frequency (times per week) 3   Duration Progress to 45 minutes of aerobic exercise without signs/symptoms of physical distress     Intensity   THRR 40-80% of Max Heartrate 88-119   Ratings of Perceived Exertion 11-13   Perceived Dyspnea 0-4     Resistance Training   Training Prescription Yes   Weight 2 lb   Reps 10-15      Exercise Goals: Frequency: Be able to perform aerobic exercise three times per week working toward 3-5 days per week.  Intensity: Work with a perceived exertion of 11 (fairly light) - 15 (hard) as tolerated. Follow your new exercise prescription and watch for changes in prescription as you progress with the program. Changes will be reviewed with you when they are made.  Duration: You should be able to do 30 minutes of continuous aerobic exercise in addition to a 5 minute warm-up and a 5 minute cool-down routine.  Nutrition Goals: Your personal nutrition goals will be established when you do your nutrition analysis with the dietician.  The following are nutrition guidelines to follow: Cholesterol < 200mg /day Sodium < 1500mg /day Fiber: Women over 50 yrs - 21 grams per  day  Personal Goals:     Personal Goals and Risk Factors at Admission - 06/25/17 1435      Core Components/Risk Factors/Patient Goals on Admission   Hypertension Yes   Intervention Provide education on lifestyle modifcations including regular physical activity/exercise, weight management, moderate sodium restriction and increased consumption of fresh fruit, vegetables, and low fat dairy, alcohol moderation, and smoking cessation.;Monitor prescription use compliance.   Expected Outcomes Short Term: Continued assessment and intervention until BP is < 140/34mm HG in hypertensive participants. < 130/62mm HG in hypertensive participants with diabetes, heart failure or chronic kidney disease.;Long Term: Maintenance of blood pressure at goal levels.   Lipids Yes   Intervention Provide education and support for participant on nutrition & aerobic/resistive exercise along with prescribed medications to achieve LDL 70mg , HDL >40mg .   Expected Outcomes Long Term: Cholesterol controlled with medications as prescribed, with individualized exercise RX and with personalized nutrition plan. Value goals: LDL < 70mg , HDL > 40 mg.      Tobacco Use Initial Evaluation: History  Smoking Status  . Never Smoker  Smokeless Tobacco  . Never Used    Exercise Goals and Review:     Exercise Goals    Row Name 06/25/17 1426  Exercise Goals   Increase Physical Activity Yes       Intervention Provide advice, education, support and counseling about physical activity/exercise needs.;Develop an individualized exercise prescription for aerobic and resistive training based on initial evaluation findings, risk stratification, comorbidities and participant's personal goals.       Expected Outcomes Achievement of increased cardiorespiratory fitness and enhanced flexibility, muscular endurance and strength shown through measurements of functional capacity and personal statement of participant.       Increase  Strength and Stamina Yes       Intervention Provide advice, education, support and counseling about physical activity/exercise needs.;Develop an individualized exercise prescription for aerobic and resistive training based on initial evaluation findings, risk stratification, comorbidities and participant's personal goals.       Expected Outcomes Achievement of increased cardiorespiratory fitness and enhanced flexibility, muscular endurance and strength shown through measurements of functional capacity and personal statement of participant.       Able to understand and use rate of perceived exertion (RPE) scale Yes       Intervention Provide education and explanation on how to use RPE scale       Expected Outcomes Short Term: Able to use RPE daily in rehab to express subjective intensity level;Long Term:  Able to use RPE to guide intensity level when exercising independently       Able to understand and use Dyspnea scale Yes       Intervention Provide education and explanation on how to use Dyspnea scale       Expected Outcomes Short Term: Able to use Dyspnea scale daily in rehab to express subjective sense of shortness of breath during exertion;Long Term: Able to use Dyspnea scale to guide intensity level when exercising independently       Knowledge and understanding of Target Heart Rate Range (THRR) Yes       Intervention Provide education and explanation of THRR including how the numbers were predicted and where they are located for reference       Expected Outcomes Short Term: Able to state/look up THRR;Long Term: Able to use THRR to govern intensity when exercising independently;Short Term: Able to use daily as guideline for intensity in rehab       Able to check pulse independently Yes       Intervention Provide education and demonstration on how to check pulse in carotid and radial arteries.;Review the importance of being able to check your own pulse for safety during independent exercise        Expected Outcomes Short Term: Able to explain why pulse checking is important during independent exercise;Long Term: Able to check pulse independently and accurately       Understanding of Exercise Prescription Yes       Intervention Provide education, explanation, and written materials on patient's individual exercise prescription       Expected Outcomes Short Term: Able to explain program exercise prescription;Long Term: Able to explain home exercise prescription to exercise independently          Copy of goals given to participant.

## 2017-06-30 ENCOUNTER — Encounter: Payer: Medicare Other | Admitting: *Deleted

## 2017-06-30 DIAGNOSIS — I2119 ST elevation (STEMI) myocardial infarction involving other coronary artery of inferior wall: Secondary | ICD-10-CM | POA: Diagnosis not present

## 2017-06-30 DIAGNOSIS — I213 ST elevation (STEMI) myocardial infarction of unspecified site: Secondary | ICD-10-CM

## 2017-06-30 NOTE — Progress Notes (Signed)
Daily Session Note  Patient Details  Name: Bianca Taylor MRN: [id number] Date of Birth: 07/24/1930 Referring Provider:     Cardiac Rehab from 06/25/2017 in [facility]  Referring Provider  End      Encounter Date: 06/30/2017  Check In:     Session Check In - 06/30/17 0856      Check-In   Location [facility]   Staff Present Amanda Sommer, BA, ACSM CEP, Exercise Physiologist; , MA, ACSM RCEP, Exercise Physiologist;Carroll Enterkin, RN, BSN   Supervising physician immediately available to respond to emergencies See telemetry face sheet for immediately available ER MD   Medication changes reported     No   Fall or balance concerns reported    No   Warm-up and Cool-down Performed on first and last piece of equipment   Resistance Training Performed Yes   VAD Patient? No     Pain Assessment   Currently in Pain? No/denies   Multiple Pain Sites No         History  Smoking Status  . Never Smoker  Smokeless Tobacco  . Never Used    Goals Met:  Exercise tolerated well Personal goals reviewed No report of cardiac concerns or symptoms Strength training completed today  Goals Unmet:  Not Applicable  Comments: First full day of exercise!  Patient was oriented to gym and equipment including functions, settings, policies, and procedures.  Patient's individual exercise prescription and treatment plan were reviewed.  All starting workloads were established based on the results of the 6 minute walk test done at initial orientation visit.  The plan for exercise progression was also introduced and progression will be customized based on patient's performance and goals.    Dr. Mark Miller is Medical Director for [facility] and [facility]. 

## 2017-07-01 ENCOUNTER — Encounter: Payer: Self-pay | Admitting: *Deleted

## 2017-07-01 NOTE — Progress Notes (Signed)
Cardiac Individual Treatment Plan  Patient Details  Name: Bianca Taylor MRN: [id number] Date of Birth: [birth year] Referring Provider:     Cardiac Rehab from 06/25/2017 in [facility] Cardiac and Pulmonary Rehab  Referring Provider  End      Initial Encounter Date:    Cardiac Rehab from 06/25/2017 in [facility] Cardiac and Pulmonary Rehab  Date  06/25/17  Referring Provider  End      Visit Diagnosis: No diagnosis found.  Patient's Home Medications on Admission:  Current Outpatient Prescriptions:  .  acetaminophen (TYLENOL) 325 MG tablet, Take 2 tablets (650 mg total) by mouth every 6 (six) hours as needed for mild pain (or Fever >/= 101)., Disp: , Rfl:  .  aspirin EC 81 MG tablet, Take 81 mg by mouth at bedtime. , Disp: , Rfl:  .  atorvastatin (LIPITOR) 40 MG tablet, Take 1 tablet (40 mg total) by mouth daily at 6 PM., Disp: 90 tablet, Rfl: 3 .  butalbital-acetaminophen-caffeine (FIORICET, ESGIC) 50-325-40 MG tablet, Take 1 tablet by mouth every 4 (four) hours as needed for headache. , Disp: , Rfl:  .  clopidogrel (PLAVIX) 75 MG tablet, Take 1 tablet (75 mg total) by mouth daily., Disp: 90 tablet, Rfl: 3 .  cyclobenzaprine (FLEXERIL) 10 MG tablet, Take 5-10 mg by mouth 2 (two) times daily as needed for muscle spasms., Disp: , Rfl:  .  metoprolol tartrate (LOPRESSOR) 25 MG tablet, Take 0.5 tablets (12.5 mg total) by mouth 2 (two) times daily., Disp: 90 tablet, Rfl: 3 .  nitroGLYCERIN (NITROSTAT) 0.4 MG SL tablet, Place 1 tablet (0.4 mg total) under the tongue every 5 (five) minutes as needed for chest pain. Maximum of 3 doses., Disp: 25 tablet, Rfl: 4 .  tamoxifen (NOLVADEX) 20 MG tablet, Take 20 mg by mouth at bedtime. , Disp: , Rfl:  .  telmisartan (MICARDIS) 80 MG tablet, Take 80 mg by mouth at bedtime., Disp: , Rfl:  .  zolpidem (AMBIEN) 5 MG tablet, Take 5 mg by mouth at bedtime as needed for sleep., Disp: , Rfl:  .  zolpidem (AMBIEN) 5 MG tablet, TAKE ONE TABLET BY MOUTH AT BEDTIME AS  NEEDED FOR SLEEP, Disp: , Rfl:   Past Medical History: Past Medical History:  Diagnosis Date  . Breast cancer ([place])    left 2013  . COPD (chronic obstructive pulmonary disease) ([place])   . GERD (gastroesophageal reflux disease)   . Hypercholesterolemia   . Hypertension   . Ischemic cardiomyopathy    a. 01/2017 Echo: EF 45-50%, mod inflat and inf HK, Gr1 DD.  Marland Kitchen ST elevation myocardial infarction (STEMI) of inferior wall ([place])    a. 01/2017 in setting of acute stroke-->medically managed.  . Stroke ([place])    a. 01/2017 MRI: 1 cm acute ischemic nonhemorrhagic lacunar type infarct involving the left lentiform nucleus. Mod cerebral atrophy, chronic microvascular isch dzs, multiple scattered remote infarcts, extensive chrnoic micro hemorrhages;  b. 01/2016 CTA head/neck: no acute IC/EC stenosis or dissection.    Tobacco Use: History  Smoking Status  . Never Smoker  Smokeless Tobacco  . Never Used    Labs: Recent Review Flowsheet Data    Labs for ITP Cardiac and Pulmonary Rehab Latest Ref Rng & Units 02/19/2017 02/20/2017   Cholestrol 0 - 200 mg/dL 171 -   LDLCALC 0 - 99 mg/dL 99 -   HDL >40 mg/dL 49 -   Trlycerides <150 mg/dL 114 -   Hemoglobin A1c 4.8 - 5.6 % 5.8(H)  5.7(H)       Exercise Target Goals:    Exercise Program Goal: Individual exercise prescription set with THRR, safety & activity barriers. Participant demonstrates ability to understand and report RPE using BORG scale, to self-measure pulse accurately, and to acknowledge the importance of the exercise prescription.  Exercise Prescription Goal: Starting with aerobic activity 30 plus minutes a day, 3 days per week for initial exercise prescription. Provide home exercise prescription and guidelines that participant acknowledges understanding prior to discharge.  Activity Barriers & Risk Stratification:     Activity Barriers & Cardiac Risk Stratification - 06/25/17 1449      Activity Barriers & Cardiac Risk Stratification    Activity Barriers None   Cardiac Risk Stratification Moderate      6 Minute Walk:     6 Minute Walk    Row Name 06/25/17 1427         6 Minute Walk   Distance 755 feet     Walk Time 6 minutes     # of Rest Breaks 0     MPH 1.42     METS 1.14     RPE 11     VO2 Peak 4     Symptoms No     Resting HR 57 bpm     Resting BP 108/62     Resting Oxygen Saturation  95 %     Exercise Oxygen Saturation  during 6 min walk 95 %     Max Ex. HR 80 bpm     Max Ex. BP 128/66     2 Minute Post BP 110/68        Oxygen Initial Assessment:   Oxygen Re-Evaluation:   Oxygen Discharge (Final Oxygen Re-Evaluation):   Initial Exercise Prescription:     Initial Exercise Prescription - 06/25/17 1400      Date of Initial Exercise RX and Referring Provider   Date 06/25/17   Referring Provider End     Treadmill   MPH 1   Grade 0   Minutes 15   METs 1.77     NuStep   Level 1   SPM 80   Minutes 15   METs 1.4     Biostep-RELP   Level 1   SPM 50   Minutes 15   METs 1.4     Prescription Details   Frequency (times per week) 3   Duration Progress to 45 minutes of aerobic exercise without signs/symptoms of physical distress     Intensity   THRR 40-80% of Max Heartrate 88-119   Ratings of Perceived Exertion 11-13   Perceived Dyspnea 0-4     Resistance Training   Training Prescription Yes   Weight 2 lb   Reps 10-15      Perform Capillary Blood Glucose checks as needed.  Exercise Prescription Changes:     Exercise Prescription Changes    Row Name 06/25/17 1400             Response to Exercise   Blood Pressure (Admit) 108/62       Blood Pressure (Exercise) 128/66       Blood Pressure (Exit) 110/68       Heart Rate (Admit) 59 bpm       Heart Rate (Exercise) 80 bpm       Heart Rate (Exit) 61 bpm       Oxygen Saturation (Admit) 95 %       Rating of Perceived Exertion (Exercise) 11  Exercise Comments:     Exercise Comments    Row Name  06/30/17 [phone]           Exercise Comments First full day of exercise!  Patient was oriented to gym and equipment including functions, settings, policies, and procedures.  Patient's individual exercise prescription and treatment plan were reviewed.  All starting workloads were established based on the results of the 6 minute walk test done at initial orientation visit.  The plan for exercise progression was also introduced and progression will be customized based on patient's performance and goals.          Exercise Goals and Review:     Exercise Goals    Row Name 06/25/17 1426             Exercise Goals   Increase Physical Activity Yes       Intervention Provide advice, education, support and counseling about physical activity/exercise needs.;Develop an individualized exercise prescription for aerobic and resistive training based on initial evaluation findings, risk stratification, comorbidities and participant's personal goals.       Expected Outcomes Achievement of increased cardiorespiratory fitness and enhanced flexibility, muscular endurance and strength shown through measurements of functional capacity and personal statement of participant.       Increase Strength and Stamina Yes       Intervention Provide advice, education, support and counseling about physical activity/exercise needs.;Develop an individualized exercise prescription for aerobic and resistive training based on initial evaluation findings, risk stratification, comorbidities and participant's personal goals.       Expected Outcomes Achievement of increased cardiorespiratory fitness and enhanced flexibility, muscular endurance and strength shown through measurements of functional capacity and personal statement of participant.       Able to understand and use rate of perceived exertion (RPE) scale Yes       Intervention Provide education and explanation on how to use RPE scale       Expected Outcomes Short Term: Able  to use RPE daily in rehab to express subjective intensity level;Long Term:  Able to use RPE to guide intensity level when exercising independently       Able to understand and use Dyspnea scale Yes       Intervention Provide education and explanation on how to use Dyspnea scale       Expected Outcomes Short Term: Able to use Dyspnea scale daily in rehab to express subjective sense of shortness of breath during exertion;Long Term: Able to use Dyspnea scale to guide intensity level when exercising independently       Knowledge and understanding of Target Heart Rate Range (THRR) Yes       Intervention Provide education and explanation of THRR including how the numbers were predicted and where they are located for reference       Expected Outcomes Short Term: Able to state/look up THRR;Long Term: Able to use THRR to govern intensity when exercising independently;Short Term: Able to use daily as guideline for intensity in rehab       Able to check pulse independently Yes       Intervention Provide education and demonstration on how to check pulse in carotid and radial arteries.;Review the importance of being able to check your own pulse for safety during independent exercise       Expected Outcomes Short Term: Able to explain why pulse checking is important during independent exercise;Long Term: Able to check pulse independently and accurately       Understanding of  Exercise Prescription Yes       Intervention Provide education, explanation, and written materials on patient's individual exercise prescription       Expected Outcomes Short Term: Able to explain program exercise prescription;Long Term: Able to explain home exercise prescription to exercise independently          Exercise Goals Re-Evaluation :     Exercise Goals Re-Evaluation    Row Name 06/30/17 0856             Exercise Goal Re-Evaluation   Exercise Goals Review Understanding of Exercise Prescription;Knowledge and understanding  of Target Heart Rate Range (THRR);Able to understand and use rate of perceived exertion (RPE) scale       Comments Reviewed RPE scale, THR and program prescription with pt today.  Pt voiced understanding and was given a copy of goals to take home.        Expected Outcomes Short: Use RPE daily to regulate intensity.  Long: Follow program prescription in THR.          Discharge Exercise Prescription (Final Exercise Prescription Changes):     Exercise Prescription Changes - 06/25/17 1400      Response to Exercise   Blood Pressure (Admit) 108/62   Blood Pressure (Exercise) 128/66   Blood Pressure (Exit) 110/68   Heart Rate (Admit) 59 bpm   Heart Rate (Exercise) 80 bpm   Heart Rate (Exit) 61 bpm   Oxygen Saturation (Admit) 95 %   Rating of Perceived Exertion (Exercise) 11      Nutrition:  Target Goals: Understanding of nutrition guidelines, daily intake of sodium 1500mg , cholesterol 200mg , calories 30% from fat and 7% or less from saturated fats, daily to have 5 or more servings of fruits and vegetables.  Biometrics:     Pre Biometrics - 06/25/17 1425      Pre Biometrics   Height 5' 1.25" (1.556 m)   Weight 109 lb 1.6 oz (49.5 kg)   Waist Circumference 29 inches   Hip Circumference 36 inches   Waist to Hip Ratio 0.81 %   BMI (Calculated) 20.44   Single Leg Stand 3.64 seconds       Nutrition Therapy Plan and Nutrition Goals:   Nutrition Discharge: Rate Your Plate Scores:     Nutrition Assessments - 06/25/17 1435      MEDFICTS Scores   Pre Score 27      Nutrition Goals Re-Evaluation:   Nutrition Goals Discharge (Final Nutrition Goals Re-Evaluation):   Psychosocial: Target Goals: Acknowledge presence or absence of significant depression and/or stress, maximize coping skills, provide positive support system. Participant is able to verbalize types and ability to use techniques and skills needed for reducing stress and depression.   Initial Review &  Psychosocial Screening:     Initial Psych Review & Screening - 06/25/17 1443      Initial Review   Current issues with Current Stress Concerns   Source of Stress Concerns Unable to perform yard/household activities      Quality of Life Scores:      Quality of Life - 06/25/17 1446      Quality of Life Scores   Health/Function Pre 20.13 %   Socioeconomic Pre 20.4 %   Psych/Spiritual Pre 20.14 %   Family Pre 20.8 %   GLOBAL Pre 20.28 %      PHQ-9: Recent Review Flowsheet Data    Depression screen [facility] 2/9 06/25/2017 08/04/2016 08/06/2015   Decreased Interest 0 0 0   Down,  Depressed, Hopeless 0 0 0   PHQ - 2 Score 0 0 0   Altered sleeping 0 - -   Tired, decreased energy 2 - -   Change in appetite 2 - -   Feeling bad or failure about yourself  1 - -   Trouble concentrating 1 - -   Moving slowly or fidgety/restless 0 - -   Suicidal thoughts 0 - -   PHQ-9 Score 6 - -     Interpretation of Total Score  Total Score Depression Severity:  1-4 = Minimal depression, 5-9 = Mild depression, 10-14 = Moderate depression, 15-19 = Moderately severe depression, 20-27 = Severe depression   Psychosocial Evaluation and Intervention:   Psychosocial Re-Evaluation:   Psychosocial Discharge (Final Psychosocial Re-Evaluation):   Vocational Rehabilitation: Provide vocational rehab assistance to qualifying candidates.   Vocational Rehab Evaluation & Intervention:     Vocational Rehab - 06/25/17 1447      Initial Vocational Rehab Evaluation & Intervention   Assessment shows need for Vocational Rehabilitation No      Education: Education Goals: Education classes will be provided on a variety of topics geared toward better understanding of heart health and risk factor modification. Participant will state understanding/return demonstration of topics presented as noted by education test scores.  Learning Barriers/Preferences:     Learning Barriers/Preferences - 06/25/17 1446       Learning Barriers/Preferences   Learning Barriers --  Some memory impairment   Learning Preferences Individual Instruction;Verbal Instruction      Education Topics: General Nutrition Guidelines/Fats and Fiber: -Group instruction provided by verbal, written material, models and posters to present the general guidelines for heart healthy nutrition. Gives an explanation and review of dietary fats and fiber.   Controlling Sodium/Reading Food Labels: -Group verbal and written material supporting the discussion of sodium use in heart healthy nutrition. Review and explanation with models, verbal and written materials for utilization of the food label.   Exercise Physiology & Risk Factors: - Group verbal and written instruction with models to review the exercise physiology of the cardiovascular system and associated critical values. Details cardiovascular disease risk factors and the goals associated with each risk factor.   Aerobic Exercise & Resistance Training: - Gives group verbal and written discussion on the health impact of inactivity. On the components of aerobic and resistive training programs and the benefits of this training and how to safely progress through these programs.   Flexibility, Balance, General Exercise Guidelines: - Provides group verbal and written instruction on the benefits of flexibility and balance training programs. Provides general exercise guidelines with specific guidelines to those with heart or lung disease. Demonstration and skill practice provided.   Stress Management: - Provides group verbal and written instruction about the health risks of elevated stress, cause of high stress, and healthy ways to reduce stress.   Depression: - Provides group verbal and written instruction on the correlation between heart/lung disease and depressed mood, treatment options, and the stigmas associated with seeking treatment.   Cardiac Rehab from 06/30/2017 in [facility]  Cardiac and Pulmonary Rehab  Date  06/30/17  Educator  [facility]  Instruction Review Code  1- Verbalizes Understanding      Anatomy & Physiology of the Heart: - Group verbal and written instruction and models provide basic cardiac anatomy and physiology, with the coronary electrical and arterial systems. Review of: AMI, Angina, Valve disease, Heart Failure, Cardiac Arrhythmia, Pacemakers, and the ICD.   Cardiac Procedures: - Group verbal and written  instruction to review commonly prescribed medications for heart disease. Reviews the medication, class of the drug, and side effects. Includes the steps to properly store meds and maintain the prescription regimen. (beta blockers and nitrates)   Cardiac Medications I: - Group verbal and written instruction to review commonly prescribed medications for heart disease. Reviews the medication, class of the drug, and side effects. Includes the steps to properly store meds and maintain the prescription regimen.   Cardiac Medications II: -Group verbal and written instruction to review commonly prescribed medications for heart disease. Reviews the medication, class of the drug, and side effects. (all other drug classes)    Go Sex-Intimacy & Heart Disease, Get SMART - Goal Setting: - Group verbal and written instruction through game format to discuss heart disease and the return to sexual intimacy. Provides group verbal and written material to discuss and apply goal setting through the application of the S.M.A.R.T. Method.   Other Matters of the Heart: - Provides group verbal, written materials and models to describe Heart Failure, Angina, Valve Disease, Peripheral Artery Disease, and Diabetes in the realm of heart disease. Includes description of the disease process and treatment options available to the cardiac patient.   Exercise & Equipment Safety: - Individual verbal instruction and demonstration of equipment use and safety with use of the  equipment.   Cardiac Rehab from 06/30/2017 in [facility] Cardiac and Pulmonary Rehab  Date  06/25/17  Educator  [place]  Instruction Review Code  1- Verbalizes Understanding      Infection Prevention: - Provides verbal and written material to individual with discussion of infection control including proper hand washing and proper equipment cleaning during exercise session.   Cardiac Rehab from 06/30/2017 in [facility] Cardiac and Pulmonary Rehab  Date  06/25/17  Educator  [place]  Instruction Review Code  1- Verbalizes Understanding      Falls Prevention: - Provides verbal and written material to individual with discussion of falls prevention and safety.   Cardiac Rehab from 06/30/2017 in [facility] Cardiac and Pulmonary Rehab  Date  06/25/17  Educator  [place]  Instruction Review Code  1- Verbalizes Understanding      Diabetes: - Individual verbal and written instruction to review signs/symptoms of diabetes, desired ranges of glucose level fasting, after meals and with exercise. Acknowledge that pre and post exercise glucose checks will be done for 3 sessions at entry of program.   Other: -Provides group and verbal instruction on various topics (see comments)    Knowledge Questionnaire Score:     Knowledge Questionnaire Score - 06/25/17 1447      Knowledge Questionnaire Score   Pre Score 26/28   correct answers reviewed with [name]       Core Components/Risk Factors/Patient Goals at Admission:     Personal Goals and Risk Factors at Admission - 06/25/17 1435      Core Components/Risk Factors/Patient Goals on Admission   Hypertension Yes   Intervention Provide education on lifestyle modifcations including regular physical activity/exercise, weight management, moderate sodium restriction and increased consumption of fresh fruit, vegetables, and low fat dairy, alcohol moderation, and smoking cessation.;Monitor prescription use compliance.   Expected Outcomes Short Term: Continued assessment and  intervention until BP is < 140/53mm HG in hypertensive participants. < 130/17mm HG in hypertensive participants with diabetes, heart failure or chronic kidney disease.;Long Term: Maintenance of blood pressure at goal levels.   Lipids Yes   Intervention Provide education and support for participant on nutrition & aerobic/resistive exercise along  with prescribed medications to achieve LDL 70mg , HDL >40mg .   Expected Outcomes Long Term: Cholesterol controlled with medications as prescribed, with individualized exercise RX and with personalized nutrition plan. Value goals: LDL < 70mg , HDL > 40 mg.      Core Components/Risk Factors/Patient Goals Review:    Core Components/Risk Factors/Patient Goals at Discharge (Final Review):    ITP Comments:     ITP Comments    Row Name 06/25/17 1423 07/01/17 0642         ITP Comments Med review completed. Initial ITP created. Diagnosis can be found in [facility] 03/10/17 30 day Review. Continue with ITP unless directed changes per Medical Director Review.          Comments:

## 2017-07-02 ENCOUNTER — Encounter: Payer: Self-pay | Admitting: *Deleted

## 2017-07-02 ENCOUNTER — Encounter: Payer: Medicare Other | Admitting: *Deleted

## 2017-07-02 DIAGNOSIS — I213 ST elevation (STEMI) myocardial infarction of unspecified site: Secondary | ICD-10-CM

## 2017-07-02 DIAGNOSIS — I2119 ST elevation (STEMI) myocardial infarction involving other coronary artery of inferior wall: Secondary | ICD-10-CM | POA: Diagnosis not present

## 2017-07-02 NOTE — Progress Notes (Signed)
Daily Session Note  Patient Details  Name: Bianca Taylor MRN: [id number] Date of Birth: [birth year] Referring Provider:     Cardiac Rehab from 06/25/2017 in [facility] Cardiac and Pulmonary Rehab  Referring Provider  End      Encounter Date: 07/02/2017  Check In:     Session Check In - 07/02/17 0838      Check-In   Location [facility]-Cardiac & Pulmonary Rehab   Staff Present Alberteen Sam, MA, ACSM RCEP, Exercise Physiologist;Amanda Oletta Darter, BA, ACSM CEP, Exercise Physiologist;Meredith Sherryll Burger, RN BSN   Supervising physician immediately available to respond to emergencies See telemetry face sheet for immediately available ER MD   Medication changes reported     No   Fall or balance concerns reported    No   Warm-up and Cool-down Performed on first and last piece of equipment   Resistance Training Performed Yes   VAD Patient? No     Pain Assessment   Currently in Pain? No/denies         History  Smoking Status  . Never Smoker  Smokeless Tobacco  . Never Used    Goals Met:  Independence with exercise equipment Exercise tolerated well No report of cardiac concerns or symptoms Strength training completed today  Goals Unmet:  Not Applicable  Comments: Pt able to follow exercise prescription today without complaint.  Will continue to monitor for progression.    Dr. Emily Filbert is Medical Director for [facility] and [facility].

## 2017-07-03 ENCOUNTER — Other Ambulatory Visit: Payer: Self-pay | Admitting: Internal Medicine

## 2017-07-03 DIAGNOSIS — Z853 Personal history of malignant neoplasm of breast: Secondary | ICD-10-CM

## 2017-07-21 ENCOUNTER — Telehealth: Payer: Self-pay | Admitting: *Deleted

## 2017-07-21 ENCOUNTER — Encounter: Payer: Self-pay | Admitting: *Deleted

## 2017-07-21 DIAGNOSIS — I213 ST elevation (STEMI) myocardial infarction of unspecified site: Secondary | ICD-10-CM

## 2017-07-21 NOTE — Telephone Encounter (Signed)
Called to check on status of return.  She has been having some bowel issues.  She saw doctor this morning.  They have started her on a new medication for her bowels. Bianca Taylor has also been having a bad cough and sees her primary tomorrow for that.  She hopes that once she is feeling better she will be able to return to rehab.

## 2017-07-22 ENCOUNTER — Other Ambulatory Visit: Payer: Self-pay | Admitting: Unknown Physician Specialty

## 2017-07-22 ENCOUNTER — Other Ambulatory Visit
Admission: RE | Admit: 2017-07-22 | Discharge: 2017-07-22 | Disposition: A | Discharge status: Home / Self Care | Payer: Medicare Other | Source: Other Acute Inpatient Hospital | Attending: Gastroenterology | Admitting: Gastroenterology

## 2017-07-22 DIAGNOSIS — K529 Noninfective gastroenteritis and colitis, unspecified: Secondary | ICD-10-CM | POA: Diagnosis present

## 2017-07-22 DIAGNOSIS — R05 Cough: Secondary | ICD-10-CM

## 2017-07-22 DIAGNOSIS — R053 Chronic cough: Secondary | ICD-10-CM

## 2017-07-22 LAB — GASTROINTESTINAL PANEL BY PCR, STOOL (REPLACES STOOL CULTURE)

## 2017-07-22 LAB — C DIFFICILE QUICK SCREEN W PCR REFLEX
C DIFFICILE (CDIFF) INTERP: NOT DETECTED
C DIFFICILE (CDIFF) TOXIN: NEGATIVE
C Diff antigen: NEGATIVE

## 2017-07-23 ENCOUNTER — Encounter: Payer: Medicare Other | Attending: Internal Medicine

## 2017-07-23 DIAGNOSIS — I2119 ST elevation (STEMI) myocardial infarction involving other coronary artery of inferior wall: Secondary | ICD-10-CM | POA: Insufficient documentation

## 2017-07-28 ENCOUNTER — Ambulatory Visit
Admission: RE | Admit: 2017-07-28 | Discharge: 2017-07-28 | Disposition: A | Discharge status: Home / Self Care | Payer: Medicare Other | Source: Ambulatory Visit | Attending: Unknown Physician Specialty | Admitting: Unknown Physician Specialty

## 2017-07-28 DIAGNOSIS — R05 Cough: Secondary | ICD-10-CM | POA: Insufficient documentation

## 2017-07-28 DIAGNOSIS — I2510 Atherosclerotic heart disease of native coronary artery without angina pectoris: Secondary | ICD-10-CM | POA: Insufficient documentation

## 2017-07-28 DIAGNOSIS — I700 Atherosclerosis of aorta: Secondary | ICD-10-CM | POA: Insufficient documentation

## 2017-07-28 DIAGNOSIS — R053 Chronic cough: Secondary | ICD-10-CM

## 2017-07-28 DIAGNOSIS — I719 Aortic aneurysm of unspecified site, without rupture: Secondary | ICD-10-CM | POA: Insufficient documentation

## 2017-07-28 LAB — POCT I-STAT CREATININE: Creatinine, Ser: 0.8 mg/dL (ref 0.44–1.00)

## 2017-07-28 MED ORDER — IOPAMIDOL (ISOVUE-300) INJECTION 61%
75.0000 mL | Freq: Once | INTRAVENOUS | Status: AC | PRN
  Administered 2017-07-28: 75 mL via INTRAVENOUS

## 2017-07-29 ENCOUNTER — Encounter: Payer: Self-pay | Admitting: *Deleted

## 2017-07-29 DIAGNOSIS — I213 ST elevation (STEMI) myocardial infarction of unspecified site: Secondary | ICD-10-CM

## 2017-07-29 NOTE — Progress Notes (Signed)
Cardiac Individual Treatment Plan  Patient Details  Name: Bianca Taylor MRN: [id number] Date of Birth: 12/24/1929 Referring Provider:     Cardiac Rehab from 06/25/2017 in [facility] Cardiac and Pulmonary Rehab  Referring Provider  End      Initial Encounter Date:    Cardiac Rehab from 06/25/2017 in [facility] Cardiac and Pulmonary Rehab  Date  06/25/17  Referring Provider  End      Visit Diagnosis: ST elevation myocardial infarction (STEMI), unspecified artery ([place])  Patient's Home Medications on Admission:  Current Outpatient Medications:  .  acetaminophen (TYLENOL) 325 MG tablet, Take 2 tablets (650 mg total) by mouth every 6 (six) hours as needed for mild pain (or Fever >/= 101)., Disp: , Rfl:  .  aspirin EC 81 MG tablet, Take 81 mg by mouth at bedtime. , Disp: , Rfl:  .  atorvastatin (LIPITOR) 40 MG tablet, Take 1 tablet (40 mg total) by mouth daily at 6 PM., Disp: 90 tablet, Rfl: 3 .  butalbital-acetaminophen-caffeine (FIORICET, ESGIC) 50-325-40 MG tablet, Take 1 tablet by mouth every 4 (four) hours as needed for headache. , Disp: , Rfl:  .  clopidogrel (PLAVIX) 75 MG tablet, Take 1 tablet (75 mg total) by mouth daily., Disp: 90 tablet, Rfl: 3 .  cyclobenzaprine (FLEXERIL) 10 MG tablet, Take 5-10 mg by mouth 2 (two) times daily as needed for muscle spasms., Disp: , Rfl:  .  metoprolol tartrate (LOPRESSOR) 25 MG tablet, Take 0.5 tablets (12.5 mg total) by mouth 2 (two) times daily., Disp: 90 tablet, Rfl: 3 .  nitroGLYCERIN (NITROSTAT) 0.4 MG SL tablet, Place 1 tablet (0.4 mg total) under the tongue every 5 (five) minutes as needed for chest pain. Maximum of 3 doses., Disp: 25 tablet, Rfl: 4 .  tamoxifen (NOLVADEX) 20 MG tablet, Take 20 mg by mouth at bedtime. , Disp: , Rfl:  .  telmisartan (MICARDIS) 80 MG tablet, Take 80 mg by mouth at bedtime., Disp: , Rfl:  .  zolpidem (AMBIEN) 5 MG tablet, Take 5 mg by mouth at bedtime as needed for sleep., Disp: , Rfl:  .  zolpidem (AMBIEN) 5 MG  tablet, TAKE ONE TABLET BY MOUTH AT BEDTIME AS NEEDED FOR SLEEP, Disp: , Rfl:   Past Medical History: Past Medical History:  Diagnosis Date  . Breast cancer ([place])    left 2013  . COPD (chronic obstructive pulmonary disease) ([place])   . GERD (gastroesophageal reflux disease)   . Hypercholesterolemia   . Hypertension   . Ischemic cardiomyopathy    a. 01/2017 Echo: EF 45-50%, mod inflat and inf HK, Gr1 DD.  Marland Kitchen ST elevation myocardial infarction (STEMI) of inferior wall ([place])    a. 01/2017 in setting of acute stroke-->medically managed.  . Stroke ([place])    a. 01/2017 MRI: 1 cm acute ischemic nonhemorrhagic lacunar type infarct involving the left lentiform nucleus. Mod cerebral atrophy, chronic microvascular isch dzs, multiple scattered remote infarcts, extensive chrnoic micro hemorrhages;  b. 01/2016 CTA head/neck: no acute IC/EC stenosis or dissection.    Tobacco Use: Social History   Tobacco Use  Smoking Status Never Smoker  Smokeless Tobacco Never Used    Labs: Recent Review Flowsheet Data    Labs for ITP Cardiac and Pulmonary Rehab Latest Ref Rng & Units 02/19/2017 02/20/2017   Cholestrol 0 - 200 mg/dL 171 -   LDLCALC 0 - 99 mg/dL 99 -   HDL >40 mg/dL 49 -   Trlycerides <150 mg/dL 114 -   Hemoglobin  A1c 4.8 - 5.6 % 5.8(H) 5.7(H)       Exercise Target Goals:    Exercise Program Goal: Individual exercise prescription set with THRR, safety & activity barriers. Participant demonstrates ability to understand and report RPE using BORG scale, to self-measure pulse accurately, and to acknowledge the importance of the exercise prescription.  Exercise Prescription Goal: Starting with aerobic activity 30 plus minutes a day, 3 days per week for initial exercise prescription. Provide home exercise prescription and guidelines that participant acknowledges understanding prior to discharge.  Activity Barriers & Risk Stratification: Activity Barriers & Cardiac Risk Stratification - 06/25/17 1449       Activity Barriers & Cardiac Risk Stratification   Activity Barriers  None    Cardiac Risk Stratification  Moderate       6 Minute Walk: 6 Minute Walk    Row Name 06/25/17 1427         6 Minute Walk   Distance  755 feet     Walk Time  6 minutes     # of Rest Breaks  0     MPH  1.42     METS  1.14     RPE  11     VO2 Peak  4     Symptoms  No     Resting HR  57 bpm     Resting BP  108/62     Resting Oxygen Saturation   95 %     Exercise Oxygen Saturation  during 6 min walk  95 %     Max Ex. HR  80 bpm     Max Ex. BP  128/66     2 Minute Post BP  110/68        Oxygen Initial Assessment:   Oxygen Re-Evaluation:   Oxygen Discharge (Final Oxygen Re-Evaluation):   Initial Exercise Prescription: Initial Exercise Prescription - 06/25/17 1400      Date of Initial Exercise RX and Referring Provider   Date  06/25/17    Referring Provider  End      Treadmill   MPH  1    Grade  0    Minutes  15    METs  1.77      NuStep   Level  1    SPM  80    Minutes  15    METs  1.4      Biostep-RELP   Level  1    SPM  50    Minutes  15    METs  1.4      Prescription Details   Frequency (times per week)  3    Duration  Progress to 45 minutes of aerobic exercise without signs/symptoms of physical distress      Intensity   THRR 40-80% of Max Heartrate  88-119    Ratings of Perceived Exertion  11-13    Perceived Dyspnea  0-4      Resistance Training   Training Prescription  Yes    Weight  2 lb    Reps  10-15       Perform Capillary Blood Glucose checks as needed.  Exercise Prescription Changes: Exercise Prescription Changes    Row Name 06/25/17 1400 07/08/17 1100           Response to Exercise   Blood Pressure (Admit)  108/62  112/62      Blood Pressure (Exercise)  128/66  114/64      Blood Pressure (Exit)  110/68  120/70      Heart Rate (Admit)  59 bpm  75 bpm      Heart Rate (Exercise)  80 bpm  75 bpm      Heart Rate (Exit)  61 bpm  70 bpm       Oxygen Saturation (Admit)  95 %  -      Rating of Perceived Exertion (Exercise)  11  13      Symptoms  -  none      Duration  -  Progress to 45 minutes of aerobic exercise without signs/symptoms of physical distress      Intensity  -  THRR unchanged        Progression   Progression  -  Continue to progress workloads to maintain intensity without signs/symptoms of physical distress.      Average METs  -  2        Resistance Training   Training Prescription  -  Yes      Weight  -  2 lb      Reps  -  10-15        Interval Training   Interval Training  -  No        Treadmill   MPH  -  1      Grade  -  0      Minutes  -  15        Biostep-RELP   Level  -  1      SPM  -  50      Minutes  -  15      METs  -  2         Exercise Comments: Exercise Comments    Row Name 06/30/17 [phone]           Exercise Comments  First full day of exercise!  Patient was oriented to gym and equipment including functions, settings, policies, and procedures.  Patient's individual exercise prescription and treatment plan were reviewed.  All starting workloads were established based on the results of the 6 minute walk test done at initial orientation visit.  The plan for exercise progression was also introduced and progression will be customized based on patient's performance and goals.          Exercise Goals and Review: Exercise Goals    Row Name 06/25/17 1426             Exercise Goals   Increase Physical Activity  Yes       Intervention  Provide advice, education, support and counseling about physical activity/exercise needs.;Develop an individualized exercise prescription for aerobic and resistive training based on initial evaluation findings, risk stratification, comorbidities and participant's personal goals.       Expected Outcomes  Achievement of increased cardiorespiratory fitness and enhanced flexibility, muscular endurance and strength shown through measurements of functional  capacity and personal statement of participant.       Increase Strength and Stamina  Yes       Intervention  Provide advice, education, support and counseling about physical activity/exercise needs.;Develop an individualized exercise prescription for aerobic and resistive training based on initial evaluation findings, risk stratification, comorbidities and participant's personal goals.       Expected Outcomes  Achievement of increased cardiorespiratory fitness and enhanced flexibility, muscular endurance and strength shown through measurements of functional capacity and personal statement of participant.       Able to understand and use rate of perceived exertion (  RPE) scale  Yes       Intervention  Provide education and explanation on how to use RPE scale       Expected Outcomes  Short Term: Able to use RPE daily in rehab to express subjective intensity level;Long Term:  Able to use RPE to guide intensity level when exercising independently       Able to understand and use Dyspnea scale  Yes       Intervention  Provide education and explanation on how to use Dyspnea scale       Expected Outcomes  Short Term: Able to use Dyspnea scale daily in rehab to express subjective sense of shortness of breath during exertion;Long Term: Able to use Dyspnea scale to guide intensity level when exercising independently       Knowledge and understanding of Target Heart Rate Range (THRR)  Yes       Intervention  Provide education and explanation of THRR including how the numbers were predicted and where they are located for reference       Expected Outcomes  Short Term: Able to state/look up THRR;Long Term: Able to use THRR to govern intensity when exercising independently;Short Term: Able to use daily as guideline for intensity in rehab       Able to check pulse independently  Yes       Intervention  Provide education and demonstration on how to check pulse in carotid and radial arteries.;Review the importance of  being able to check your own pulse for safety during independent exercise       Expected Outcomes  Short Term: Able to explain why pulse checking is important during independent exercise;Long Term: Able to check pulse independently and accurately       Understanding of Exercise Prescription  Yes       Intervention  Provide education, explanation, and written materials on patient's individual exercise prescription       Expected Outcomes  Short Term: Able to explain program exercise prescription;Long Term: Able to explain home exercise prescription to exercise independently          Exercise Goals Re-Evaluation : Exercise Goals Re-Evaluation    Row Name 06/30/17 0856 07/08/17 1148 07/21/17 1432         Exercise Goal Re-Evaluation   Exercise Goals Review  Understanding of Exercise Prescription;Knowledge and understanding of Target Heart Rate Range (THRR);Able to understand and use rate of perceived exertion (RPE) scale  Increase Physical Activity;Increase Strength and Stamina  -     Comments  Reviewed RPE scale, THR and program prescription with pt today.  Pt voiced understanding and was given a copy of goals to take home.   Bianca Taylor is tolerating exercise well in her first weeks of class.  Staff will continue to monitor progress.  Out since last review.     Expected Outcomes  Short: Use RPE daily to regulate intensity.  Long: Follow program prescription in THR.  Short - Bianca Taylor will attend regularly.  Long - Bianca Taylor will incorporate more activity in her daily lifestyle.  -        Discharge Exercise Prescription (Final Exercise Prescription Changes): Exercise Prescription Changes - 07/08/17 1100      Response to Exercise   Blood Pressure (Admit)  112/62    Blood Pressure (Exercise)  114/64    Blood Pressure (Exit)  120/70    Heart Rate (Admit)  75 bpm    Heart Rate (Exercise)  75 bpm    Heart Rate (  Exit)  70 bpm    Rating of Perceived Exertion (Exercise)  13    Symptoms  none    Duration   Progress to 45 minutes of aerobic exercise without signs/symptoms of physical distress    Intensity  THRR unchanged      Progression   Progression  Continue to progress workloads to maintain intensity without signs/symptoms of physical distress.    Average METs  2      Resistance Training   Training Prescription  Yes    Weight  2 lb    Reps  10-15      Interval Training   Interval Training  No      Treadmill   MPH  1    Grade  0    Minutes  15      Biostep-RELP   Level  1    SPM  50    Minutes  15    METs  2       Nutrition:  Target Goals: Understanding of nutrition guidelines, daily intake of sodium 1500mg , cholesterol 200mg , calories 30% from fat and 7% or less from saturated fats, daily to have 5 or more servings of fruits and vegetables.  Biometrics: Pre Biometrics - 06/25/17 1425      Pre Biometrics   Height  5' 1.25" (1.556 m)    Weight  109 lb 1.6 oz (49.5 kg)    Waist Circumference  29 inches    Hip Circumference  36 inches    Waist to Hip Ratio  0.81 %    BMI (Calculated)  20.44    Single Leg Stand  3.64 seconds        Nutrition Therapy Plan and Nutrition Goals: Nutrition Therapy & Goals - 07/02/17 1452      Nutrition Therapy   RD appointment defered  Yes for now   for now      Nutrition Discharge: Rate Your Plate Scores: Nutrition Assessments - 06/25/17 1435      MEDFICTS Scores   Pre Score  27       Nutrition Goals Re-Evaluation:   Nutrition Goals Discharge (Final Nutrition Goals Re-Evaluation):   Psychosocial: Target Goals: Acknowledge presence or absence of significant depression and/or stress, maximize coping skills, provide positive support system. Participant is able to verbalize types and ability to use techniques and skills needed for reducing stress and depression.   Initial Review & Psychosocial Screening: Initial Psych Review & Screening - 06/25/17 1443      Initial Review   Current issues with  Current Stress Concerns     Source of Stress Concerns  Unable to perform yard/household activities       Quality of Life Scores:  Quality of Life - 06/25/17 1446      Quality of Life Scores   Health/Function Pre  20.13 %    Socioeconomic Pre  20.4 %    Psych/Spiritual Pre  20.14 %    Family Pre  20.8 %    GLOBAL Pre  20.28 %       PHQ-9: Recent Review Flowsheet Data    Depression screen [facility] 2/9 06/25/2017 08/04/2016 08/06/2015   Decreased Interest 0 0 0   Down, Depressed, Hopeless 0 0 0   PHQ - 2 Score 0 0 0   Altered sleeping 0 - -   Tired, decreased energy 2 - -   Change in appetite 2 - -   Feeling bad or failure about yourself  1 - -  Trouble concentrating 1 - -   Moving slowly or fidgety/restless 0 - -   Suicidal thoughts 0 - -   PHQ-9 Score 6 - -     Interpretation of Total Score  Total Score Depression Severity:  1-4 = Minimal depression, 5-9 = Mild depression, 10-14 = Moderate depression, 15-19 = Moderately severe depression, 20-27 = Severe depression   Psychosocial Evaluation and Intervention:   Psychosocial Re-Evaluation:   Psychosocial Discharge (Final Psychosocial Re-Evaluation):   Vocational Rehabilitation: Provide vocational rehab assistance to qualifying candidates.   Vocational Rehab Evaluation & Intervention: Vocational Rehab - 06/25/17 1447      Initial Vocational Rehab Evaluation & Intervention   Assessment shows need for Vocational Rehabilitation  No       Education: Education Goals: Education classes will be provided on a variety of topics geared toward better understanding of heart health and risk factor modification. Participant will state understanding/return demonstration of topics presented as noted by education test scores.  Learning Barriers/Preferences: Learning Barriers/Preferences - 06/25/17 1446      Learning Barriers/Preferences   Learning Barriers  -- Some memory impairment   Some memory impairment   Learning Preferences  Individual  Instruction;Verbal Instruction       Education Topics: General Nutrition Guidelines/Fats and Fiber: -Group instruction provided by verbal, written material, models and posters to present the general guidelines for heart healthy nutrition. Gives an explanation and review of dietary fats and fiber.   Controlling Sodium/Reading Food Labels: -Group verbal and written material supporting the discussion of sodium use in heart healthy nutrition. Review and explanation with models, verbal and written materials for utilization of the food label.   Exercise Physiology & Risk Factors: - Group verbal and written instruction with models to review the exercise physiology of the cardiovascular system and associated critical values. Details cardiovascular disease risk factors and the goals associated with each risk factor.   Aerobic Exercise & Resistance Training: - Gives group verbal and written discussion on the health impact of inactivity. On the components of aerobic and resistive training programs and the benefits of this training and how to safely progress through these programs.   Flexibility, Balance, General Exercise Guidelines: - Provides group verbal and written instruction on the benefits of flexibility and balance training programs. Provides general exercise guidelines with specific guidelines to those with heart or lung disease. Demonstration and skill practice provided.   Stress Management: - Provides group verbal and written instruction about the health risks of elevated stress, cause of high stress, and healthy ways to reduce stress.   Depression: - Provides group verbal and written instruction on the correlation between heart/lung disease and depressed mood, treatment options, and the stigmas associated with seeking treatment.   Cardiac Rehab from 07/02/2017 in [facility] Cardiac and Pulmonary Rehab  Date  06/30/17  Educator  [facility]  Instruction Review Code  1- Verbalizes Understanding       Anatomy & Physiology of the Heart: - Group verbal and written instruction and models provide basic cardiac anatomy and physiology, with the coronary electrical and arterial systems. Review of: AMI, Angina, Valve disease, Heart Failure, Cardiac Arrhythmia, Pacemakers, and the ICD.   Cardiac Procedures: - Group verbal and written instruction to review commonly prescribed medications for heart disease. Reviews the medication, class of the drug, and side effects. Includes the steps to properly store meds and maintain the prescription regimen. (beta blockers and nitrates)   Cardiac Medications I: - Group verbal and written instruction to review commonly prescribed  medications for heart disease. Reviews the medication, class of the drug, and side effects. Includes the steps to properly store meds and maintain the prescription regimen.   Cardiac Medications II: -Group verbal and written instruction to review commonly prescribed medications for heart disease. Reviews the medication, class of the drug, and side effects. (all other drug classes)    Go Sex-Intimacy & Heart Disease, Get SMART - Goal Setting: - Group verbal and written instruction through game format to discuss heart disease and the return to sexual intimacy. Provides group verbal and written material to discuss and apply goal setting through the application of the S.M.A.R.T. Method.   Other Matters of the Heart: - Provides group verbal, written materials and models to describe Heart Failure, Angina, Valve Disease, Peripheral Artery Disease, and Diabetes in the realm of heart disease. Includes description of the disease process and treatment options available to the cardiac patient.   Exercise & Equipment Safety: - Individual verbal instruction and demonstration of equipment use and safety with use of the equipment.   Cardiac Rehab from 07/02/2017 in [facility] Cardiac and Pulmonary Rehab  Date  06/25/17  Educator  [place]  Instruction  Review Code  1- Verbalizes Understanding      Infection Prevention: - Provides verbal and written material to individual with discussion of infection control including proper hand washing and proper equipment cleaning during exercise session.   Cardiac Rehab from 07/02/2017 in [facility] Cardiac and Pulmonary Rehab  Date  06/25/17  Educator  [place]  Instruction Review Code  1- Verbalizes Understanding      Falls Prevention: - Provides verbal and written material to individual with discussion of falls prevention and safety.   Cardiac Rehab from 07/02/2017 in [facility] Cardiac and Pulmonary Rehab  Date  06/25/17  Educator  [place]  Instruction Review Code  1- Verbalizes Understanding      Diabetes: - Individual verbal and written instruction to review signs/symptoms of diabetes, desired ranges of glucose level fasting, after meals and with exercise. Acknowledge that pre and post exercise glucose checks will be done for 3 sessions at entry of program.   Other: -Provides group and verbal instruction on various topics (see comments)    Knowledge Questionnaire Score: Knowledge Questionnaire Score - 06/25/17 1447      Knowledge Questionnaire Score   Pre Score  26/28  correct answers reviewed with Bianca Taylor    correct answers reviewed with Bianca Taylor       Core Components/Risk Factors/Patient Goals at Admission: Personal Goals and Risk Factors at Admission - 06/25/17 1435      Core Components/Risk Factors/Patient Goals on Admission   Hypertension  Yes    Intervention  Provide education on lifestyle modifcations including regular physical activity/exercise, weight management, moderate sodium restriction and increased consumption of fresh fruit, vegetables, and low fat dairy, alcohol moderation, and smoking cessation.;Monitor prescription use compliance.    Expected Outcomes  Short Term: Continued assessment and intervention until BP is < 140/30mm HG in hypertensive participants. < 130/36mm HG in  hypertensive participants with diabetes, heart failure or chronic kidney disease.;Long Term: Maintenance of blood pressure at goal levels.    Lipids  Yes    Intervention  Provide education and support for participant on nutrition & aerobic/resistive exercise along with prescribed medications to achieve LDL 70mg , HDL >40mg .    Expected Outcomes  Long Term: Cholesterol controlled with medications as prescribed, with individualized exercise RX and with personalized nutrition plan. Value goals: LDL < 70mg , HDL > 40  mg.       Core Components/Risk Factors/Patient Goals Review:    Core Components/Risk Factors/Patient Goals at Discharge (Final Review):    ITP Comments: ITP Comments    Row Name 06/25/17 1423 07/01/17 0642 07/21/17 1432 07/29/17 0646     ITP Comments  Med review completed. Initial ITP created. Diagnosis can be found in [facility] 03/10/17  30 day Review. Continue with ITP unless directed changes per Medical Director Review.   Called to check on status of return.  She has been having some bowel issues.  She saw doctor this morning.  They have started her on a new medication for her bowels. Bianca Taylor has also been having a bad cough and sees her primary tomorrow for that.  She hopes that once she is feeling better she will be able to return to rehab.   30 day review. Continue with ITP unless directed changes per Medical Director review.        Comments:

## 2017-07-30 ENCOUNTER — Encounter: Payer: Self-pay | Admitting: *Deleted

## 2017-07-30 ENCOUNTER — Telehealth: Payer: Self-pay | Admitting: *Deleted

## 2017-07-30 DIAGNOSIS — I213 ST elevation (STEMI) myocardial infarction of unspecified site: Secondary | ICD-10-CM

## 2017-07-30 NOTE — Progress Notes (Signed)
Discharge Progress Report  Patient Details  Name: Bianca Taylor MRN: [id number] Date of Birth: May 15, 1930 Referring Provider:     Cardiac Rehab from 06/25/2017 in [facility]  Referring Provider  End       Number of Visits: 5/36  Reason for Discharge:  Early Exit:  Personal  Smoking History:  Social History   Tobacco Use  Smoking Status Never Smoker  Smokeless Tobacco Never Used    Diagnosis:  ST elevation myocardial infarction (STEMI), unspecified artery ([place])  ADL [facility]:   Initial Exercise Prescription: Initial Exercise Prescription - 06/25/17 1400      Date of Initial Exercise RX and Referring Provider   Date  06/25/17    Referring Provider  End      Treadmill   MPH  1    Grade  0    Minutes  15    METs  1.77      NuStep   Level  1    SPM  80    Minutes  15    METs  1.4      Biostep-RELP   Level  1    SPM  50    Minutes  15    METs  1.4      Prescription Details   Frequency (times per week)  3    Duration  Progress to 45 minutes of aerobic exercise without signs/symptoms of physical distress      Intensity   THRR 40-80% of Max Heartrate  88-119    Ratings of Perceived Exertion  11-13    Perceived Dyspnea  0-4      Resistance Training   Training Prescription  Yes    Weight  2 lb    Reps  10-15       Discharge Exercise Prescription (Final Exercise Prescription Changes): Exercise Prescription Changes - 07/08/17 1100      Response to Exercise   Blood Pressure (Admit)  112/62    Blood Pressure (Exercise)  114/64    Blood Pressure (Exit)  120/70    Heart Rate (Admit)  75 bpm    Heart Rate (Exercise)  75 bpm    Heart Rate (Exit)  70 bpm    Rating of Perceived Exertion (Exercise)  13    Symptoms  none    Duration  Progress to 45 minutes of aerobic exercise without signs/symptoms of physical distress    Intensity  THRR unchanged      Progression   Progression  Continue to progress workloads to maintain intensity  without signs/symptoms of physical distress.    Average METs  2      Resistance Training   Training Prescription  Yes    Weight  2 lb    Reps  10-15      Interval Training   Interval Training  No      Treadmill   MPH  1    Grade  0    Minutes  15      Biostep-RELP   Level  1    SPM  50    Minutes  15    METs  2       Functional Capacity: 6 Minute Walk    Row Name 06/25/17 1427         6 Minute Walk   Distance  755 feet     Walk Time  6 minutes     # of Rest Breaks  0  MPH  1.42     METS  1.14     RPE  11     VO2 Peak  4     Symptoms  No     Resting HR  57 bpm     Resting BP  108/62     Resting Oxygen Saturation   95 %     Exercise Oxygen Saturation  during 6 min walk  95 %     Max Ex. HR  80 bpm     Max Ex. BP  128/66     2 Minute Post BP  110/68        Psychological, QOL, Others - Outcomes: PHQ 2/9: Depression screen [facility] 2/9 06/25/2017 08/04/2016 08/06/2015  Decreased Interest 0 0 0  Down, Depressed, Hopeless 0 0 0  PHQ - 2 Score 0 0 0  Altered sleeping 0 - -  Tired, decreased energy 2 - -  Change in appetite 2 - -  Feeling bad or failure about yourself  1 - -  Trouble concentrating 1 - -  Moving slowly or fidgety/restless 0 - -  Suicidal thoughts 0 - -  PHQ-9 Score 6 - -    Quality of Life: Quality of Life - 06/25/17 1446      Quality of Life Scores   Health/Function Pre  20.13 %    Socioeconomic Pre  20.4 %    Psych/Spiritual Pre  20.14 %    Family Pre  20.8 %    GLOBAL Pre  20.28 %       Personal Goals: Goals established at orientation with interventions provided to work toward goal. Personal Goals and Risk Factors at Admission - 06/25/17 1435      Core Components/Risk Factors/Patient Goals on Admission   Hypertension  Yes    Intervention  Provide education on lifestyle modifcations including regular physical activity/exercise, weight management, moderate sodium restriction and increased consumption of fresh fruit, vegetables,  and low fat dairy, alcohol moderation, and smoking cessation.;Monitor prescription use compliance.    Expected Outcomes  Short Term: Continued assessment and intervention until BP is < 140/48mm HG in hypertensive participants. < 130/75mm HG in hypertensive participants with diabetes, heart failure or chronic kidney disease.;Long Term: Maintenance of blood pressure at goal levels.    Lipids  Yes    Intervention  Provide education and support for participant on nutrition & aerobic/resistive exercise along with prescribed medications to achieve LDL 70mg , HDL >40mg .    Expected Outcomes  Long Term: Cholesterol controlled with medications as prescribed, with individualized exercise RX and with personalized nutrition plan. Value goals: LDL < 70mg , HDL > 40 mg.        Personal Goals Discharge:   Exercise Goals and Review: Exercise Goals    Row Name 06/25/17 1426             Exercise Goals   Increase Physical Activity  Yes       Intervention  Provide advice, education, support and counseling about physical activity/exercise needs.;Develop an individualized exercise prescription for aerobic and resistive training based on initial evaluation findings, risk stratification, comorbidities and participant's personal goals.       Expected Outcomes  Achievement of increased cardiorespiratory fitness and enhanced flexibility, muscular endurance and strength shown through measurements of functional capacity and personal statement of participant.       Increase Strength and Stamina  Yes       Intervention  Provide advice, education, support and counseling about physical activity/exercise  needs.;Develop an individualized exercise prescription for aerobic and resistive training based on initial evaluation findings, risk stratification, comorbidities and participant's personal goals.       Expected Outcomes  Achievement of increased cardiorespiratory fitness and enhanced flexibility, muscular endurance and  strength shown through measurements of functional capacity and personal statement of participant.       Able to understand and use rate of perceived exertion (RPE) scale  Yes       Intervention  Provide education and explanation on how to use RPE scale       Expected Outcomes  Short Term: Able to use RPE daily in rehab to express subjective intensity level;Long Term:  Able to use RPE to guide intensity level when exercising independently       Able to understand and use Dyspnea scale  Yes       Intervention  Provide education and explanation on how to use Dyspnea scale       Expected Outcomes  Short Term: Able to use Dyspnea scale daily in rehab to express subjective sense of shortness of breath during exertion;Long Term: Able to use Dyspnea scale to guide intensity level when exercising independently       Knowledge and understanding of Target Heart Rate Range (THRR)  Yes       Intervention  Provide education and explanation of THRR including how the numbers were predicted and where they are located for reference       Expected Outcomes  Short Term: Able to state/look up THRR;Long Term: Able to use THRR to govern intensity when exercising independently;Short Term: Able to use daily as guideline for intensity in rehab       Able to check pulse independently  Yes       Intervention  Provide education and demonstration on how to check pulse in carotid and radial arteries.;Review the importance of being able to check your own pulse for safety during independent exercise       Expected Outcomes  Short Term: Able to explain why pulse checking is important during independent exercise;Long Term: Able to check pulse independently and accurately       Understanding of Exercise Prescription  Yes       Intervention  Provide education, explanation, and written materials on patient's individual exercise prescription       Expected Outcomes  Short Term: Able to explain program exercise prescription;Long Term: Able  to explain home exercise prescription to exercise independently          Nutrition & Weight - Outcomes: Pre Biometrics - 06/25/17 1425      Pre Biometrics   Height  5' 1.25" (1.556 m)    Weight  109 lb 1.6 oz (49.5 kg)    Waist Circumference  29 inches    Hip Circumference  36 inches    Waist to Hip Ratio  0.81 %    BMI (Calculated)  20.44    Single Leg Stand  3.64 seconds        Nutrition: Nutrition Therapy & Goals - 07/02/17 1452      Nutrition Therapy   RD appointment defered  Yes for now   for now      Nutrition Discharge: Nutrition Assessments - 06/25/17 1435      MEDFICTS Scores   Pre Score  27       Education Questionnaire Score: Knowledge Questionnaire Score - 06/25/17 1447      Knowledge Questionnaire Score   Pre Score  26/28  correct answers reviewed with [name]    correct answers reviewed with [name]       Goals reviewed with patient; copy given to patient.

## 2017-07-30 NOTE — Telephone Encounter (Signed)
Mr. Rottman called to cancel rehab.  We will discharge her at this time.

## 2017-07-30 NOTE — Progress Notes (Signed)
Cardiac Individual Treatment Plan  Patient Details  Name: Bianca Taylor MRN: [id number] Date of Birth: January 20, 1930 Referring Provider:     Cardiac Rehab from 06/25/2017 in [facility] Cardiac and Pulmonary Rehab  Referring Provider  End      Initial Encounter Date:    Cardiac Rehab from 06/25/2017 in [facility] Cardiac and Pulmonary Rehab  Date  06/25/17  Referring Provider  End      Visit Diagnosis: ST elevation myocardial infarction (STEMI), unspecified artery ([place])  Patient's Home Medications on Admission:  Current Outpatient Medications:  .  acetaminophen (TYLENOL) 325 MG tablet, Take 2 tablets (650 mg total) by mouth every 6 (six) hours as needed for mild pain (or Fever >/= 101)., Disp: , Rfl:  .  aspirin EC 81 MG tablet, Take 81 mg by mouth at bedtime. , Disp: , Rfl:  .  atorvastatin (LIPITOR) 40 MG tablet, Take 1 tablet (40 mg total) by mouth daily at 6 PM., Disp: 90 tablet, Rfl: 3 .  butalbital-acetaminophen-caffeine (FIORICET, ESGIC) 50-325-40 MG tablet, Take 1 tablet by mouth every 4 (four) hours as needed for headache. , Disp: , Rfl:  .  clopidogrel (PLAVIX) 75 MG tablet, Take 1 tablet (75 mg total) by mouth daily., Disp: 90 tablet, Rfl: 3 .  cyclobenzaprine (FLEXERIL) 10 MG tablet, Take 5-10 mg by mouth 2 (two) times daily as needed for muscle spasms., Disp: , Rfl:  .  metoprolol tartrate (LOPRESSOR) 25 MG tablet, Take 0.5 tablets (12.5 mg total) by mouth 2 (two) times daily., Disp: 90 tablet, Rfl: 3 .  nitroGLYCERIN (NITROSTAT) 0.4 MG SL tablet, Place 1 tablet (0.4 mg total) under the tongue every 5 (five) minutes as needed for chest pain. Maximum of 3 doses., Disp: 25 tablet, Rfl: 4 .  tamoxifen (NOLVADEX) 20 MG tablet, Take 20 mg by mouth at bedtime. , Disp: , Rfl:  .  telmisartan (MICARDIS) 80 MG tablet, Take 80 mg by mouth at bedtime., Disp: , Rfl:  .  zolpidem (AMBIEN) 5 MG tablet, Take 5 mg by mouth at bedtime as needed for sleep., Disp: , Rfl:  .  zolpidem (AMBIEN) 5 MG  tablet, TAKE ONE TABLET BY MOUTH AT BEDTIME AS NEEDED FOR SLEEP, Disp: , Rfl:   Past Medical History: Past Medical History:  Diagnosis Date  . Breast cancer ([place])    left 2013  . COPD (chronic obstructive pulmonary disease) ([place])   . GERD (gastroesophageal reflux disease)   . Hypercholesterolemia   . Hypertension   . Ischemic cardiomyopathy    a. 01/2017 Echo: EF 45-50%, mod inflat and inf HK, Gr1 DD.  Marland Kitchen ST elevation myocardial infarction (STEMI) of inferior wall ([place])    a. 01/2017 in setting of acute stroke-->medically managed.  . Stroke ([place])    a. 01/2017 MRI: 1 cm acute ischemic nonhemorrhagic lacunar type infarct involving the left lentiform nucleus. Mod cerebral atrophy, chronic microvascular isch dzs, multiple scattered remote infarcts, extensive chrnoic micro hemorrhages;  b. 01/2016 CTA head/neck: no acute IC/EC stenosis or dissection.    Tobacco Use: Social History   Tobacco Use  Smoking Status Never Smoker  Smokeless Tobacco Never Used    Labs: Recent Review Flowsheet Data    Labs for ITP Cardiac and Pulmonary Rehab Latest Ref Rng & Units 02/19/2017 02/20/2017   Cholestrol 0 - 200 mg/dL 171 -   LDLCALC 0 - 99 mg/dL 99 -   HDL >40 mg/dL 49 -   Trlycerides <150 mg/dL 114 -   Hemoglobin  A1c 4.8 - 5.6 % 5.8(H) 5.7(H)       Exercise Target Goals:    Exercise Program Goal: Individual exercise prescription set with THRR, safety & activity barriers. Participant demonstrates ability to understand and report RPE using BORG scale, to self-measure pulse accurately, and to acknowledge the importance of the exercise prescription.  Exercise Prescription Goal: Starting with aerobic activity 30 plus minutes a day, 3 days per week for initial exercise prescription. Provide home exercise prescription and guidelines that participant acknowledges understanding prior to discharge.  Activity Barriers & Risk Stratification: Activity Barriers & Cardiac Risk Stratification - 06/25/17 1449       Activity Barriers & Cardiac Risk Stratification   Activity Barriers  None    Cardiac Risk Stratification  Moderate       6 Minute Walk: 6 Minute Walk    Row Name 06/25/17 1427         6 Minute Walk   Distance  755 feet     Walk Time  6 minutes     # of Rest Breaks  0     MPH  1.42     METS  1.14     RPE  11     VO2 Peak  4     Symptoms  No     Resting HR  57 bpm     Resting BP  108/62     Resting Oxygen Saturation   95 %     Exercise Oxygen Saturation  during 6 min walk  95 %     Max Ex. HR  80 bpm     Max Ex. BP  128/66     2 Minute Post BP  110/68        Oxygen Initial Assessment:   Oxygen Re-Evaluation:   Oxygen Discharge (Final Oxygen Re-Evaluation):   Initial Exercise Prescription: Initial Exercise Prescription - 06/25/17 1400      Date of Initial Exercise RX and Referring Provider   Date  06/25/17    Referring Provider  End      Treadmill   MPH  1    Grade  0    Minutes  15    METs  1.77      NuStep   Level  1    SPM  80    Minutes  15    METs  1.4      Biostep-RELP   Level  1    SPM  50    Minutes  15    METs  1.4      Prescription Details   Frequency (times per week)  3    Duration  Progress to 45 minutes of aerobic exercise without signs/symptoms of physical distress      Intensity   THRR 40-80% of Max Heartrate  88-119    Ratings of Perceived Exertion  11-13    Perceived Dyspnea  0-4      Resistance Training   Training Prescription  Yes    Weight  2 lb    Reps  10-15       Perform Capillary Blood Glucose checks as needed.  Exercise Prescription Changes: Exercise Prescription Changes    Row Name 06/25/17 1400 07/08/17 1100           Response to Exercise   Blood Pressure (Admit)  108/62  112/62      Blood Pressure (Exercise)  128/66  114/64      Blood Pressure (Exit)  110/68  120/70      Heart Rate (Admit)  59 bpm  75 bpm      Heart Rate (Exercise)  80 bpm  75 bpm      Heart Rate (Exit)  61 bpm  70 bpm       Oxygen Saturation (Admit)  95 %  -      Rating of Perceived Exertion (Exercise)  11  13      Symptoms  -  none      Duration  -  Progress to 45 minutes of aerobic exercise without signs/symptoms of physical distress      Intensity  -  THRR unchanged        Progression   Progression  -  Continue to progress workloads to maintain intensity without signs/symptoms of physical distress.      Average METs  -  2        Resistance Training   Training Prescription  -  Yes      Weight  -  2 lb      Reps  -  10-15        Interval Training   Interval Training  -  No        Treadmill   MPH  -  1      Grade  -  0      Minutes  -  15        Biostep-RELP   Level  -  1      SPM  -  50      Minutes  -  15      METs  -  2         Exercise Comments: Exercise Comments    Row Name 06/30/17 [phone]           Exercise Comments  First full day of exercise!  Patient was oriented to gym and equipment including functions, settings, policies, and procedures.  Patient's individual exercise prescription and treatment plan were reviewed.  All starting workloads were established based on the results of the 6 minute walk test done at initial orientation visit.  The plan for exercise progression was also introduced and progression will be customized based on patient's performance and goals.          Exercise Goals and Review: Exercise Goals    Row Name 06/25/17 1426             Exercise Goals   Increase Physical Activity  Yes       Intervention  Provide advice, education, support and counseling about physical activity/exercise needs.;Develop an individualized exercise prescription for aerobic and resistive training based on initial evaluation findings, risk stratification, comorbidities and participant's personal goals.       Expected Outcomes  Achievement of increased cardiorespiratory fitness and enhanced flexibility, muscular endurance and strength shown through measurements of functional  capacity and personal statement of participant.       Increase Strength and Stamina  Yes       Intervention  Provide advice, education, support and counseling about physical activity/exercise needs.;Develop an individualized exercise prescription for aerobic and resistive training based on initial evaluation findings, risk stratification, comorbidities and participant's personal goals.       Expected Outcomes  Achievement of increased cardiorespiratory fitness and enhanced flexibility, muscular endurance and strength shown through measurements of functional capacity and personal statement of participant.       Able to understand and use rate of perceived exertion (  RPE) scale  Yes       Intervention  Provide education and explanation on how to use RPE scale       Expected Outcomes  Short Term: Able to use RPE daily in rehab to express subjective intensity level;Long Term:  Able to use RPE to guide intensity level when exercising independently       Able to understand and use Dyspnea scale  Yes       Intervention  Provide education and explanation on how to use Dyspnea scale       Expected Outcomes  Short Term: Able to use Dyspnea scale daily in rehab to express subjective sense of shortness of breath during exertion;Long Term: Able to use Dyspnea scale to guide intensity level when exercising independently       Knowledge and understanding of Target Heart Rate Range (THRR)  Yes       Intervention  Provide education and explanation of THRR including how the numbers were predicted and where they are located for reference       Expected Outcomes  Short Term: Able to state/look up THRR;Long Term: Able to use THRR to govern intensity when exercising independently;Short Term: Able to use daily as guideline for intensity in rehab       Able to check pulse independently  Yes       Intervention  Provide education and demonstration on how to check pulse in carotid and radial arteries.;Review the importance of  being able to check your own pulse for safety during independent exercise       Expected Outcomes  Short Term: Able to explain why pulse checking is important during independent exercise;Long Term: Able to check pulse independently and accurately       Understanding of Exercise Prescription  Yes       Intervention  Provide education, explanation, and written materials on patient's individual exercise prescription       Expected Outcomes  Short Term: Able to explain program exercise prescription;Long Term: Able to explain home exercise prescription to exercise independently          Exercise Goals Re-Evaluation : Exercise Goals Re-Evaluation    Row Name 06/30/17 0856 07/08/17 1148 07/21/17 1432         Exercise Goal Re-Evaluation   Exercise Goals Review  Understanding of Exercise Prescription;Knowledge and understanding of Target Heart Rate Range (THRR);Able to understand and use rate of perceived exertion (RPE) scale  Increase Physical Activity;Increase Strength and Stamina  -     Comments  Reviewed RPE scale, THR and program prescription with pt today.  Pt voiced understanding and was given a copy of goals to take home.   Bianca Taylor is tolerating exercise well in her first weeks of class.  Staff will continue to monitor progress.  Out since last review.     Expected Outcomes  Short: Use RPE daily to regulate intensity.  Long: Follow program prescription in THR.  Short - Maytal will attend regularly.  Long - Bianca Taylor will incorporate more activity in her daily lifestyle.  -        Discharge Exercise Prescription (Final Exercise Prescription Changes): Exercise Prescription Changes - 07/08/17 1100      Response to Exercise   Blood Pressure (Admit)  112/62    Blood Pressure (Exercise)  114/64    Blood Pressure (Exit)  120/70    Heart Rate (Admit)  75 bpm    Heart Rate (Exercise)  75 bpm    Heart Rate (  Exit)  70 bpm    Rating of Perceived Exertion (Exercise)  13    Symptoms  none    Duration   Progress to 45 minutes of aerobic exercise without signs/symptoms of physical distress    Intensity  THRR unchanged      Progression   Progression  Continue to progress workloads to maintain intensity without signs/symptoms of physical distress.    Average METs  2      Resistance Training   Training Prescription  Yes    Weight  2 lb    Reps  10-15      Interval Training   Interval Training  No      Treadmill   MPH  1    Grade  0    Minutes  15      Biostep-RELP   Level  1    SPM  50    Minutes  15    METs  2       Nutrition:  Target Goals: Understanding of nutrition guidelines, daily intake of sodium 1500mg , cholesterol 200mg , calories 30% from fat and 7% or less from saturated fats, daily to have 5 or more servings of fruits and vegetables.  Biometrics: Pre Biometrics - 06/25/17 1425      Pre Biometrics   Height  5' 1.25" (1.556 m)    Weight  109 lb 1.6 oz (49.5 kg)    Waist Circumference  29 inches    Hip Circumference  36 inches    Waist to Hip Ratio  0.81 %    BMI (Calculated)  20.44    Single Leg Stand  3.64 seconds        Nutrition Therapy Plan and Nutrition Goals: Nutrition Therapy & Goals - 07/02/17 1452      Nutrition Therapy   RD appointment defered  Yes for now   for now      Nutrition Discharge: Rate Your Plate Scores: Nutrition Assessments - 06/25/17 1435      MEDFICTS Scores   Pre Score  27       Nutrition Goals Re-Evaluation:   Nutrition Goals Discharge (Final Nutrition Goals Re-Evaluation):   Psychosocial: Target Goals: Acknowledge presence or absence of significant depression and/or stress, maximize coping skills, provide positive support system. Participant is able to verbalize types and ability to use techniques and skills needed for reducing stress and depression.   Initial Review & Psychosocial Screening: Initial Psych Review & Screening - 06/25/17 1443      Initial Review   Current issues with  Current Stress Concerns     Source of Stress Concerns  Unable to perform yard/household activities       Quality of Life Scores:  Quality of Life - 06/25/17 1446      Quality of Life Scores   Health/Function Pre  20.13 %    Socioeconomic Pre  20.4 %    Psych/Spiritual Pre  20.14 %    Family Pre  20.8 %    GLOBAL Pre  20.28 %       PHQ-9: Recent Review Flowsheet Data    Depression screen [facility] 2/9 06/25/2017 08/04/2016 08/06/2015   Decreased Interest 0 0 0   Down, Depressed, Hopeless 0 0 0   PHQ - 2 Score 0 0 0   Altered sleeping 0 - -   Tired, decreased energy 2 - -   Change in appetite 2 - -   Feeling bad or failure about yourself  1 - -  Trouble concentrating 1 - -   Moving slowly or fidgety/restless 0 - -   Suicidal thoughts 0 - -   PHQ-9 Score 6 - -     Interpretation of Total Score  Total Score Depression Severity:  1-4 = Minimal depression, 5-9 = Mild depression, 10-14 = Moderate depression, 15-19 = Moderately severe depression, 20-27 = Severe depression   Psychosocial Evaluation and Intervention:   Psychosocial Re-Evaluation:   Psychosocial Discharge (Final Psychosocial Re-Evaluation):   Vocational Rehabilitation: Provide vocational rehab assistance to qualifying candidates.   Vocational Rehab Evaluation & Intervention: Vocational Rehab - 06/25/17 1447      Initial Vocational Rehab Evaluation & Intervention   Assessment shows need for Vocational Rehabilitation  No       Education: Education Goals: Education classes will be provided on a variety of topics geared toward better understanding of heart health and risk factor modification. Participant will state understanding/return demonstration of topics presented as noted by education test scores.  Learning Barriers/Preferences: Learning Barriers/Preferences - 06/25/17 1446      Learning Barriers/Preferences   Learning Barriers  -- Some memory impairment   Some memory impairment   Learning Preferences  Individual  Instruction;Verbal Instruction       Education Topics: General Nutrition Guidelines/Fats and Fiber: -Group instruction provided by verbal, written material, models and posters to present the general guidelines for heart healthy nutrition. Gives an explanation and review of dietary fats and fiber.   Controlling Sodium/Reading Food Labels: -Group verbal and written material supporting the discussion of sodium use in heart healthy nutrition. Review and explanation with models, verbal and written materials for utilization of the food label.   Exercise Physiology & Risk Factors: - Group verbal and written instruction with models to review the exercise physiology of the cardiovascular system and associated critical values. Details cardiovascular disease risk factors and the goals associated with each risk factor.   Aerobic Exercise & Resistance Training: - Gives group verbal and written discussion on the health impact of inactivity. On the components of aerobic and resistive training programs and the benefits of this training and how to safely progress through these programs.   Flexibility, Balance, General Exercise Guidelines: - Provides group verbal and written instruction on the benefits of flexibility and balance training programs. Provides general exercise guidelines with specific guidelines to those with heart or lung disease. Demonstration and skill practice provided.   Stress Management: - Provides group verbal and written instruction about the health risks of elevated stress, cause of high stress, and healthy ways to reduce stress.   Depression: - Provides group verbal and written instruction on the correlation between heart/lung disease and depressed mood, treatment options, and the stigmas associated with seeking treatment.   Cardiac Rehab from 07/02/2017 in [facility] Cardiac and Pulmonary Rehab  Date  06/30/17  Educator  [facility]  Instruction Review Code  1- Verbalizes Understanding       Anatomy & Physiology of the Heart: - Group verbal and written instruction and models provide basic cardiac anatomy and physiology, with the coronary electrical and arterial systems. Review of: AMI, Angina, Valve disease, Heart Failure, Cardiac Arrhythmia, Pacemakers, and the ICD.   Cardiac Procedures: - Group verbal and written instruction to review commonly prescribed medications for heart disease. Reviews the medication, class of the drug, and side effects. Includes the steps to properly store meds and maintain the prescription regimen. (beta blockers and nitrates)   Cardiac Medications I: - Group verbal and written instruction to review commonly prescribed  medications for heart disease. Reviews the medication, class of the drug, and side effects. Includes the steps to properly store meds and maintain the prescription regimen.   Cardiac Medications II: -Group verbal and written instruction to review commonly prescribed medications for heart disease. Reviews the medication, class of the drug, and side effects. (all other drug classes)    Go Sex-Intimacy & Heart Disease, Get SMART - Goal Setting: - Group verbal and written instruction through game format to discuss heart disease and the return to sexual intimacy. Provides group verbal and written material to discuss and apply goal setting through the application of the S.M.A.R.T. Method.   Other Matters of the Heart: - Provides group verbal, written materials and models to describe Heart Failure, Angina, Valve Disease, Peripheral Artery Disease, and Diabetes in the realm of heart disease. Includes description of the disease process and treatment options available to the cardiac patient.   Exercise & Equipment Safety: - Individual verbal instruction and demonstration of equipment use and safety with use of the equipment.   Cardiac Rehab from 07/02/2017 in [facility] Cardiac and Pulmonary Rehab  Date  06/25/17  Educator  [place]  Instruction  Review Code  1- Verbalizes Understanding      Infection Prevention: - Provides verbal and written material to individual with discussion of infection control including proper hand washing and proper equipment cleaning during exercise session.   Cardiac Rehab from 07/02/2017 in [facility] Cardiac and Pulmonary Rehab  Date  06/25/17  Educator  [place]  Instruction Review Code  1- Verbalizes Understanding      Falls Prevention: - Provides verbal and written material to individual with discussion of falls prevention and safety.   Cardiac Rehab from 07/02/2017 in [facility] Cardiac and Pulmonary Rehab  Date  06/25/17  Educator  [place]  Instruction Review Code  1- Verbalizes Understanding      Diabetes: - Individual verbal and written instruction to review signs/symptoms of diabetes, desired ranges of glucose level fasting, after meals and with exercise. Acknowledge that pre and post exercise glucose checks will be done for 3 sessions at entry of program.   Other: -Provides group and verbal instruction on various topics (see comments)    Knowledge Questionnaire Score: Knowledge Questionnaire Score - 06/25/17 1447      Knowledge Questionnaire Score   Pre Score  26/28  correct answers reviewed with Bianca Taylor    correct answers reviewed with Bianca Taylor       Core Components/Risk Factors/Patient Goals at Admission: Personal Goals and Risk Factors at Admission - 06/25/17 1435      Core Components/Risk Factors/Patient Goals on Admission   Hypertension  Yes    Intervention  Provide education on lifestyle modifcations including regular physical activity/exercise, weight management, moderate sodium restriction and increased consumption of fresh fruit, vegetables, and low fat dairy, alcohol moderation, and smoking cessation.;Monitor prescription use compliance.    Expected Outcomes  Short Term: Continued assessment and intervention until BP is < 140/28mm HG in hypertensive participants. < 130/73mm HG in  hypertensive participants with diabetes, heart failure or chronic kidney disease.;Long Term: Maintenance of blood pressure at goal levels.    Lipids  Yes    Intervention  Provide education and support for participant on nutrition & aerobic/resistive exercise along with prescribed medications to achieve LDL 70mg , HDL >40mg .    Expected Outcomes  Long Term: Cholesterol controlled with medications as prescribed, with individualized exercise RX and with personalized nutrition plan. Value goals: LDL < 70mg , HDL > 40  mg.       Core Components/Risk Factors/Patient Goals Review:    Core Components/Risk Factors/Patient Goals at Discharge (Final Review):    ITP Comments: ITP Comments    Row Name 06/25/17 1423 07/01/17 0642 07/21/17 1432 07/29/17 0646 07/30/17 1421   ITP Comments  Med review completed. Initial ITP created. Diagnosis can be found in [facility] 03/10/17  30 day Review. Continue with ITP unless directed changes per Medical Director Review.   Called to check on status of return.  She has been having some bowel issues.  She saw doctor this morning.  They have started her on a new medication for her bowels. Bianca Taylor has also been having a bad cough and sees her primary tomorrow for that.  She hopes that once she is feeling better she will be able to return to rehab.   30 day review. Continue with ITP unless directed changes per Medical Director review.   Mr. Biglow called to cancel rehab.  We will discharge her at this time.   Discharge ITP created and sent.  Discharge Summary sent.       Comments: discharge ITP

## 2017-07-31 ENCOUNTER — Institutional Professional Consult (permissible substitution): Payer: Medicare Other | Admitting: Internal Medicine

## 2017-08-04 ENCOUNTER — Ambulatory Visit (INDEPENDENT_AMBULATORY_CARE_PROVIDER_SITE_OTHER): Payer: Medicare Other | Admitting: Internal Medicine

## 2017-08-04 ENCOUNTER — Encounter: Payer: Self-pay | Admitting: Internal Medicine

## 2017-08-04 VITALS — BP 94/60 | HR 93 | Ht 61.25 in | Wt 107.0 lb

## 2017-08-04 DIAGNOSIS — R05 Cough: Secondary | ICD-10-CM

## 2017-08-04 DIAGNOSIS — J411 Mucopurulent chronic bronchitis: Secondary | ICD-10-CM | POA: Diagnosis not present

## 2017-08-04 DIAGNOSIS — R059 Cough, unspecified: Secondary | ICD-10-CM

## 2017-08-04 DIAGNOSIS — I255 Ischemic cardiomyopathy: Secondary | ICD-10-CM

## 2017-08-04 NOTE — Progress Notes (Signed)
Name: Bianca Taylor MRN: [id number] DOB: 1930/01/12     CONSULTATION DATE: (Not on file)  REFERRING MD : Kerrin Mo  CHIEF COMPLAINT:  cough  STUDIES:  CT chest 11/18 RLL ground glass subpleural opacity approx 1.2 CM Stable from previous CT chest in 01/2017 I have Independently reviewed images of  CT chest   on 08/04/2017     HISTORY OF PRESENT ILLNESS:  83 yo white female with chronic cough For many months +prooductive cough Mostly on early AM Has improved with prednisone and abx No wheezing noted  6 months ago patient had a chronic cough she went to go see her primary care doctor who had prescribed her antibiotics and nasal sprays which had improved a little bit and then subsequently a proximally 1 month later her chronic cough returned patient was given antibiotics prednisone and she felt better however her symptoms recurred patient was then referred to ENT saw Dr. Tami Ribas in the office and suggested a CAT scan of her chest which does show a right lower lobe subpleural groundglass opacification which has been stable over the last 6 months and he advised to go see pulmonary  At this time patient states that her symptoms have completely resolved is been about 1 week She quit tobacco abuse 30 years ago Patient has a strong history of secondhand smoke exposure Patient has a strong history of environmental exposure from home cooking  There are no signs of congestive heart failure at this time No signs of infection at this time Her cough has resolved No shortness of breath no chest pain at this time follow-up 6 months  PAST MEDICAL HISTORY :   has a past medical history of Breast cancer ([place]), COPD (chronic obstructive pulmonary disease) ([place]), GERD (gastroesophageal reflux disease), Hypercholesterolemia, Hypertension, Ischemic cardiomyopathy, ST elevation myocardial infarction (STEMI) of inferior wall ([place]), and Stroke ([place]).  has a past surgical history that includes  Breast Mammosite (Left); Cataract extraction; Brain surgery; Breast biopsy (Left); Breast biopsy (Bilateral); and Breast excisional biopsy (Left). Prior to Admission medications   Medication Sig Start Date End Date Taking? Authorizing Provider  acetaminophen (TYLENOL) 325 MG tablet Take 2 tablets (650 mg total) by mouth every 6 (six) hours as needed for mild pain (or Fever >/= 101). 02/20/17  Yes Gouru, Illene Silver, MD  aspirin EC 81 MG tablet Take 81 mg by mouth at bedtime.    Yes [provider]  atorvastatin (LIPITOR) 40 MG tablet Take 1 tablet (40 mg total) by mouth daily at 6 PM. 03/10/17  Yes Rogelia Mire, NP  butalbital-acetaminophen-caffeine (FIORICET, ESGIC) 50-325-40 MG tablet Take 1 tablet by mouth every 4 (four) hours as needed for headache.    Yes [provider]  clopidogrel (PLAVIX) 75 MG tablet Take 1 tablet (75 mg total) by mouth daily. 03/10/17  Yes Rogelia Mire, NP  cyclobenzaprine (FLEXERIL) 10 MG tablet Take 5-10 mg by mouth 2 (two) times daily as needed for muscle spasms.   Yes [provider]  metoprolol tartrate (LOPRESSOR) 25 MG tablet Take 0.5 tablets (12.5 mg total) by mouth 2 (two) times daily. 03/10/17  Yes Rogelia Mire, NP  pantoprazole (PROTONIX) 40 MG tablet Take 40 mg daily by mouth.   Yes [provider]  tamoxifen (NOLVADEX) 20 MG tablet Take 20 mg by mouth at bedtime.    Yes [provider]  telmisartan (MICARDIS) 80 MG tablet Take 80 mg by mouth at bedtime.   Yes [provider]  zolpidem (AMBIEN) 5 MG tablet Take 5 mg by mouth at bedtime as needed for sleep.   Yes [provider]  nitroGLYCERIN (NITROSTAT) 0.4 MG SL tablet Place 1 tablet (0.4 mg total) under the tongue every 5 (five) minutes as needed for chest pain. Maximum of 3 doses. 03/10/17 06/10/17  Rogelia Mire, NP   Allergies  Allergen Reactions  . Codeine     Other reaction(s): Dizziness  . Fluocinolone Nausea And  Vomiting  . Moxifloxacin     Other reaction(s): Vomiting  . Pravastatin     Other reaction(s): Muscle Pain  . Risedronate Other (See Comments)    Bone pain  . Tetracyclines & Related Nausea And Vomiting  . Niacin Rash    FAMILY HISTORY:  family history includes Breast cancer (age of onset: 76) in her sister. SOCIAL HISTORY:  reports that  has never smoked. she has never used smokeless tobacco. She reports that she does not drink alcohol.  REVIEW OF SYSTEMS:   Constitutional: Negative for fever, chills, weight loss, malaise/fatigue and diaphoresis.  HENT: Negative for hearing loss, ear pain, nosebleeds, congestion, sore throat, neck pain, tinnitus and ear discharge.   Eyes: Negative for blurred vision, double vision, photophobia, pain, discharge and redness.  Respiratory: Negative for cough, hemoptysis, sputum production, shortness of breath, wheezing and stridor.   Cardiovascular: Negative for chest pain, palpitations, orthopnea, claudication, leg swelling and PND.  Gastrointestinal: Negative for heartburn, nausea, vomiting, abdominal pain, diarrhea, constipation, blood in stool and melena.  Genitourinary: Negative for dysuria, urgency, frequency, hematuria and flank pain.  Musculoskeletal: Negative for myalgias, back pain, joint pain and falls.  Skin: Negative for itching and rash.  Neurological: Negative for dizziness, tingling, tremors, sensory change, speech change, focal weakness, seizures, loss of consciousness, weakness and headaches.  Endo/Heme/Allergies: Negative for environmental allergies and polydipsia. Does not bruise/bleed easily.  ALL OTHER ROS ARE NEGATIVE    VITAL SIGNS: BP 94/60 (BP Location: Left Arm, Cuff Size: Normal)   Pulse 93   Ht 5' 1.25" (1.556 m)   Wt 107 lb (48.5 kg)   SpO2 98%   BMI 20.05 kg/m    Physical Examination:   GENERAL:NAD, no fevers, chills, no weakness no fatigue HEAD: Normocephalic, atraumatic.  EYES: Pupils equal, round,  reactive to light. Extraocular muscles intact. No scleral icterus.  MOUTH: Moist mucosal membrane.   EAR, NOSE, THROAT: Clear without exudates. No external lesions.  NECK: Supple. No thyromegaly. No nodules. No JVD.  PULMONARY:CTA B/L no wheezes, no crackles, no rhonchi CARDIOVASCULAR: S1 and S2. Regular rate and rhythm. No murmurs, rubs, or gallops. No edema.  GASTROINTESTINAL: Soft, nontender, nondistended. No masses. Positive bowel sounds.  MUSCULOSKELETAL: No swelling, clubbing, or edema. Range of motion full in all extremities.  NEUROLOGIC: Cranial nerves II through XII are intact. No gross focal neurological deficits.  SKIN: No ulceration, lesions, rashes, or cyanosis. Skin warm and dry. Turgor intact.  PSYCHIATRIC: Mood, affect within normal limits. The patient is awake, alert and oriented x 3. Insight, judgment intact.       ASSESSMENT / PLAN: 83 year old pleasant white female seen today for chronic cough in the setting of chronic secondhand smoke exposure with a previous history of remote smoking history also in the setting of chronic environmental exposure from cooking symptoms most likely related to chronic bronchitis.  At this time her symptoms have been going on for 6 months however in the last 1 week her symptoms have improved significantly at which point she  does not want any therapy at this time and I have to agree with her that there is no indication for inhalers or steroid or antibiotics at this time  I have explained to her that her diagnosis is strongly consistent with chronic bronchitis and her symptoms may return and I have advised her to call me with any change in her respiratory symptoms If patient her symptoms return would recommend inhaled corticosteroid therapy and albuterol inhaler as needed   Patient/Family are satisfied with Plan of action and management. All questions answered  Corrin Parker, M.D.  [facility]  Medical  Director [place] Director [facility] Cardio-Pulmonary Department

## 2017-08-04 NOTE — Patient Instructions (Addendum)
Follow up in 6 months Call us back if symptoms return

## 2017-08-17 ENCOUNTER — Other Ambulatory Visit: Payer: Self-pay

## 2017-08-17 ENCOUNTER — Ambulatory Visit
Admission: RE | Admit: 2017-08-17 | Discharge: 2017-08-17 | Disposition: A | Discharge status: Home / Self Care | Payer: Medicare Other | Source: Ambulatory Visit | Attending: Internal Medicine | Admitting: Internal Medicine

## 2017-08-17 ENCOUNTER — Encounter: Payer: Self-pay | Admitting: Radiation Oncology

## 2017-08-17 ENCOUNTER — Ambulatory Visit
Admission: RE | Admit: 2017-08-17 | Discharge: 2017-08-17 | Disposition: A | Discharge status: Home / Self Care | Payer: Medicare Other | Source: Ambulatory Visit | Attending: Radiation Oncology | Admitting: Radiation Oncology

## 2017-08-17 VITALS — BP 116/67 | HR 65 | Temp 96.8°F | Resp 18 | Wt 108.8 lb

## 2017-08-17 DIAGNOSIS — Z8673 Personal history of transient ischemic attack (TIA), and cerebral infarction without residual deficits: Secondary | ICD-10-CM | POA: Diagnosis not present

## 2017-08-17 DIAGNOSIS — Z923 Personal history of irradiation: Secondary | ICD-10-CM | POA: Diagnosis not present

## 2017-08-17 DIAGNOSIS — Z853 Personal history of malignant neoplasm of breast: Secondary | ICD-10-CM

## 2017-08-17 DIAGNOSIS — D0512 Intraductal carcinoma in situ of left breast: Secondary | ICD-10-CM

## 2017-08-17 DIAGNOSIS — R918 Other nonspecific abnormal finding of lung field: Secondary | ICD-10-CM | POA: Insufficient documentation

## 2017-08-17 DIAGNOSIS — I252 Old myocardial infarction: Secondary | ICD-10-CM | POA: Diagnosis not present

## 2017-08-17 DIAGNOSIS — Z79810 Long term (current) use of selective estrogen receptor modulators (SERMs): Secondary | ICD-10-CM | POA: Insufficient documentation

## 2017-08-17 DIAGNOSIS — Z170 Estrogen receptor positive status [ER+]: Secondary | ICD-10-CM | POA: Insufficient documentation

## 2017-08-17 NOTE — Progress Notes (Signed)
Radiation Oncology Follow up Note  Name: Bianca Taylor   Date:   08/17/2017 MRN:  [id number] DOB: 16-May-1930    This 83 y.o. female presents to the clinic today for 5 year follow-up status post accelerated partial breast radiation to her left breast for ductal carcinoma in situ ER/PR positive.   REFERRING PROVIDER: [name], MD  HPI: Patient is a 83 year old female now out 5 years having completed accelerated partial breast radiation to her left breast for ER/PR positive ductal carcinoma in situ. She is seen today in routine follow-up and is doing well.. She is being followed by a subpleural right lower lobe mass which is been stable by CT criteria of the past 6 months. Unfortunately back in June 2018 she had a CVA as well as myocardial infarction for which she is recovering. She has continued on tamoxifen. Her mammogram from a year ago which I have reviewed was BI-RADS 2 benign. She scheduled for another one today.  COMPLICATIONS OF TREATMENT: none  FOLLOW UP COMPLIANCE: keeps appointments   PHYSICAL EXAM:  BP 116/67   Pulse 65   Temp (!) 96.8 F (36 C)   Resp 18   Wt 108 lb 12.8 oz (49.3 kg)   BMI 20.39 kg/m  Left breast is an area of thickening in lumpectomy site consistent with high dose rate remote afterloading. There is also some telangiectatic changes of the skin over the balloon site. Otherwise breast exam is unremarkable. No axillary or supraclavicular adenopathy is identified. Well-developed well-nourished patient in NAD. HEENT reveals PERLA, EOMI, discs not visualized.  Oral cavity is clear. No oral mucosal lesions are identified. Neck is clear without evidence of cervical or supraclavicular adenopathy. Lungs are clear to A&P. Cardiac examination is essentially unremarkable with regular rate and rhythm without murmur rub or thrill. Abdomen is benign with no organomegaly or masses noted. Motor sensory and DTR levels are equal and symmetric in the upper and lower  extremities. Cranial nerves II through XII are grossly intact. Proprioception is intact. No peripheral adenopathy or edema is identified. No motor or sensory levels are noted. Crude visual fields are within normal range.  RADIOLOGY RESULTS: Previous mammograms CT scans and MRI scans of brain are all reviewed and compatible with the above-stated findings  PLAN: At this time she is doing well 5 years out from ductal carcinoma in situ. I'm going to discontinue follow-up care. She continues with her PMD ordering her mammograms. I have told her she can discontinue the tamoxifen at this point based on her advanced age her cardiac history and that this was ductal carcinoma in situ. Patient knows to call with any concerns at any time.  I would like to take this opportunity to thank you for allowing me to participate in the care of your patient.Armstead Peaks., MD

## 2017-09-08 NOTE — Progress Notes (Signed)
Follow-up Outpatient Visit Date: 09/09/2017  Primary Care Provider: [name], MD [place] [address]  Chief Complaint: Follow-up coronary artery disease  HPI:  Bianca Taylor is a 83 y.o. year-old female with history of  coronary artery disease with inferior STEMI in the setting of acute stroke in 01/2017, hypertension, hyperlipidemia, breast cancer, COPD, and GERD, who presents for follow-up of coronary artery disease. I last saw her in September, at which time she was doing well. She denied chest pain, shortness of breath, and residual neurologic deficits. We agreed to continue with medical therapy of her coronary artery disease, as well as referral to cardiac rehab.  Today, Bianca Taylor feels well. Her only complaint is of easy bruising (she is on ASA and clopidogrel). She denies chest pain, shortness of breath, palpitations, lightheadedness, and new neurologic changes. She does not have any residual deficits from her stroke in May. Her appetite has gradually improved, particularly over the last month. She was troubled by a chronic cough this fall that did not respond to 2 courses of antibiotics or steroids. She was seen by ENT without cause identified. Fortunately, cough has resolved on its own. Bianca Taylor participated in cardiac rehab but had to stop early due to the aforementioned cough. She is not exercising regularly at this time but is not interested in returning to cardiac rehab.  --------------------------------------------------------------------------------------------------  Past Medical History:  Diagnosis Date  . Breast cancer ([place])    left 2013  . COPD (chronic obstructive pulmonary disease) ([place])   . GERD (gastroesophageal reflux disease)   . Hypercholesterolemia   . Hypertension   . Ischemic cardiomyopathy    a. 01/2017 Echo: EF 45-50%, mod inflat and inf HK, Gr1 DD.  Marland Kitchen ST elevation myocardial infarction (STEMI) of inferior  wall ([place])    a. 01/2017 in setting of acute stroke-->medically managed.  . Stroke ([place])    a. 01/2017 MRI: 1 cm acute ischemic nonhemorrhagic lacunar type infarct involving the left lentiform nucleus. Mod cerebral atrophy, chronic microvascular isch dzs, multiple scattered remote infarcts, extensive chrnoic micro hemorrhages;  b. 01/2016 CTA head/neck: no acute IC/EC stenosis or dissection.   Past Surgical History:  Procedure Laterality Date  . BRAIN SURGERY    . BREAST BIOPSY Left    positive 03/2012  . BREAST BIOPSY Bilateral    negative 2000  . BREAST EXCISIONAL BIOPSY Left    positive 04/2012  . BREAST LUMPECTOMY Left 2013  . BREAST MAMMOSITE Left   . CATARACT EXTRACTION      Current Meds  Medication Sig  . acetaminophen (TYLENOL) 325 MG tablet Take 2 tablets (650 mg total) by mouth every 6 (six) hours as needed for mild pain (or Fever >/= 101).  Marland Kitchen aspirin EC 81 MG tablet Take 81 mg by mouth at bedtime.   Marland Kitchen atorvastatin (LIPITOR) 40 MG tablet Take 1 tablet (40 mg total) by mouth daily at 6 PM.  . butalbital-acetaminophen-caffeine (FIORICET, ESGIC) 50-325-40 MG tablet Take 1 tablet by mouth every 4 (four) hours as needed for headache.   . clopidogrel (PLAVIX) 75 MG tablet Take 1 tablet (75 mg total) by mouth daily.  . cyclobenzaprine (FLEXERIL) 10 MG tablet Take 5-10 mg by mouth 2 (two) times daily as needed for muscle spasms.  . metoprolol tartrate (LOPRESSOR) 25 MG tablet Take 0.5 tablets (12.5 mg total) by mouth 2 (two) times daily.  . nitroGLYCERIN (NITROSTAT) 0.4 MG SL tablet Place 1 tablet (0.4  mg total) under the tongue every 5 (five) minutes as needed for chest pain. Maximum of 3 doses.  . pantoprazole (PROTONIX) 40 MG tablet Take 40 mg daily by mouth.  . telmisartan (MICARDIS) 80 MG tablet Take 80 mg by mouth at bedtime.  Marland Kitchen zolpidem (AMBIEN) 5 MG tablet Take 5 mg by mouth at bedtime as needed for sleep.    Allergies: Codeine; Fluocinolone; Moxifloxacin; Pravastatin;  Risedronate; Tetracyclines & related; and Niacin  Social History   Socioeconomic History  . Marital status: Married    Spouse name: Not on file  . Number of children: Not on file  . Years of education: Not on file  . Highest education level: Not on file  Social Needs  . Financial resource strain: Not on file  . Food insecurity - worry: Not on file  . Food insecurity - inability: Not on file  . Transportation needs - medical: Not on file  . Transportation needs - non-medical: Not on file  Occupational History  . Not on file  Tobacco Use  . Smoking status: Never Smoker  . Smokeless tobacco: Never Used  Substance and Sexual Activity  . Alcohol use: No  . Drug use: Not on file  . Sexual activity: Not on file  Other Topics Concern  . Not on file  Social History Narrative  . Not on file    Family History  Problem Relation Age of Onset  . Breast cancer Sister 45    Review of Systems: A 12-system review of systems was performed and was negative except as noted in the HPI.  --------------------------------------------------------------------------------------------------  Physical Exam: BP 120/70 (BP Location: Left Arm, Patient Position: Sitting, Cuff Size: Normal)   Pulse 64   Ht 5\' 2"  (1.575 m)   Wt 107 lb 8 oz (48.8 kg)   BMI 19.66 kg/m   General:  Thin, elderly woman, seated comfortably in the exam room. HEENT: No conjunctival pallor or scleral icterus. Moist mucous membranes.  OP clear. Neck: Supple without lymphadenopathy, thyromegaly, JVD, or HJR.  Lungs: Normal work of breathing. Clear to auscultation bilaterally without wheezes or crackles. Heart: Regular rate and rhythm without murmurs, rubs, or gallops. Non-displaced PMI. Abd: Bowel sounds present. Soft, NT/ND without hepatosplenomegaly Ext: No lower extremity edema. Radial, PT, and DP pulses are 2+ bilaterally. Skin: Warm and dry without rash.  EKG:  Normal sinus rhythm, low voltage, and inferior Q-waves  with T-wave inversions. No change since 06/10/17.  Lab Results  Component Value Date   WBC 9.5 02/20/2017   HGB 13.0 02/20/2017   HCT 38.2 02/20/2017   MCV 89.7 02/20/2017   PLT 394 02/20/2017    Lab Results  Component Value Date   NA 138 02/20/2017   K 3.8 02/20/2017   CL 104 02/20/2017   CO2 28 02/20/2017   BUN 14 02/20/2017   CREATININE 0.80 07/28/2017   GLUCOSE 106 (H) 02/20/2017   ALT 23 02/18/2017    Lab Results  Component Value Date   CHOL 171 02/19/2017   HDL 49 02/19/2017   LDLCALC 99 02/19/2017   TRIG 114 02/19/2017   CHOLHDL 3.5 02/19/2017    --------------------------------------------------------------------------------------------------  ASSESSMENT AND PLAN: Coronary artery disease without angina Patient has recovered well from her MI in 01/2017. She did not undergo catheterization at that time due to concurrent acute stroke. She denies angina and shortness of breath. EKG is unchanged with evidence of inferior MI. We discussed the risks and benefits of ischemia evaluation to assess her burden  of disease (stress test or catheterization). However, given her age and frailty, we have agreed to defer this in favor of medical management. She should alert [place] if she develops chest pain, shortness of breath, or other concerns. We will continue ASA and clopidogrel for 1 year, after which we will continue indefinite ASA. We will continue with metoprolol 12.5 mg BID.  History of stroke No new neurologic symptoms. Continue secondary prevention.  Hyperlipidemia LDL by Dr. Caryl Comes in June was 86 (goal < 70). We will continue atorvastatin 40 mg daily. Repeat lipid panel should be obtained when she follows up with Dr. Caryl Comes in the next month or two. Otherwise, we will recheck her lipids when she returns to see [place].  Hypertension BP controlled. Continue metoprolol and telmisartan.  Follow-up: Return to clinic in 4 months.  Nelva Bush, MD 09/09/2017 11:51 AM

## 2017-09-09 ENCOUNTER — Encounter: Payer: Self-pay | Admitting: Internal Medicine

## 2017-09-09 ENCOUNTER — Ambulatory Visit (INDEPENDENT_AMBULATORY_CARE_PROVIDER_SITE_OTHER): Payer: Medicare Other | Admitting: Internal Medicine

## 2017-09-09 VITALS — BP 120/70 | HR 64 | Ht 62.0 in | Wt 107.5 lb

## 2017-09-09 DIAGNOSIS — Z8673 Personal history of transient ischemic attack (TIA), and cerebral infarction without residual deficits: Secondary | ICD-10-CM

## 2017-09-09 DIAGNOSIS — I2510 Atherosclerotic heart disease of native coronary artery without angina pectoris: Secondary | ICD-10-CM

## 2017-09-09 DIAGNOSIS — E785 Hyperlipidemia, unspecified: Secondary | ICD-10-CM

## 2017-09-09 DIAGNOSIS — I10 Essential (primary) hypertension: Secondary | ICD-10-CM | POA: Diagnosis not present

## 2017-09-09 NOTE — Patient Instructions (Signed)
Medication Instructions:  Your physician recommends that you continue on your current medications as directed. Please refer to the Current Medication list given to you today.   Labwork: None  - Please have your PCP check CBC and Lipid profile.   Testing/Procedures: none  Follow-Up: Your physician recommends that you schedule a follow-up appointment in: 4 MONTHS WITH DR END.    If you need a refill on your cardiac medications before your next appointment, please call your pharmacy.

## 2017-09-10 ENCOUNTER — Encounter: Payer: Self-pay | Admitting: Internal Medicine

## 2017-09-30 ENCOUNTER — Other Ambulatory Visit: Payer: Self-pay | Admitting: Internal Medicine

## 2017-09-30 DIAGNOSIS — R519 Headache, unspecified: Secondary | ICD-10-CM

## 2017-09-30 DIAGNOSIS — R51 Headache: Secondary | ICD-10-CM

## 2017-09-30 DIAGNOSIS — D329 Benign neoplasm of meninges, unspecified: Secondary | ICD-10-CM

## 2017-10-07 ENCOUNTER — Ambulatory Visit
Admission: RE | Admit: 2017-10-07 | Discharge: 2017-10-07 | Disposition: A | Discharge status: Home / Self Care | Payer: Medicare Other | Source: Ambulatory Visit | Attending: Internal Medicine | Admitting: Internal Medicine

## 2017-10-07 DIAGNOSIS — D329 Benign neoplasm of meninges, unspecified: Secondary | ICD-10-CM | POA: Diagnosis not present

## 2017-10-07 DIAGNOSIS — R51 Headache: Secondary | ICD-10-CM | POA: Insufficient documentation

## 2017-10-07 DIAGNOSIS — R519 Headache, unspecified: Secondary | ICD-10-CM

## 2017-12-16 ENCOUNTER — Other Ambulatory Visit: Payer: Self-pay | Admitting: Nurse Practitioner

## 2018-01-14 NOTE — Progress Notes (Signed)
Erroneous encounter

## 2018-03-04 ENCOUNTER — Telehealth: Payer: Self-pay | Admitting: Internal Medicine

## 2018-03-04 NOTE — Telephone Encounter (Signed)
As it has been >12 months since STEMI, it is ok for Ms. Ardila to discontinue clopidogrel.  She should remain on ASA 81 mg daily.  If her bruising persists, she should speak with her PCP about further workup.  We will f/u as planned next month and address preop clearance at that time.  Nelva Bush, MD [facility] Pager: [phone]

## 2018-03-04 NOTE — Telephone Encounter (Signed)
Pt spouse calling stating pt for a few months they've noticed patient have small bruises and it would grow and then go away  They are scheduled to see Dr End in July  But would like to make sure that there is nothing else wrong.  Please advise

## 2018-03-04 NOTE — Telephone Encounter (Signed)
Spoke with patients husband per release form and reviewed Dr. Darnelle Bos recommendations. He verbalized understanding with no further questions at this time.

## 2018-03-04 NOTE — Telephone Encounter (Signed)
Spoke with patients spouse and he is concerned about her increased bruising. They mentioned it to her primary provider and they recommended that she please give [place] a call. They had mentioned something about stopping it after a year and wanted to check on that. He also states that she is scheduled in August to have her right eye fixed due to "Roll In" and they wanted to know about clearance and medications for that as well. Advised that I will route to provider and confirmed upcoming appointment next month. He verbalized understanding of our conversation and has no further questions at this time.

## 2018-04-07 ENCOUNTER — Encounter: Payer: Self-pay | Admitting: Internal Medicine

## 2018-04-07 ENCOUNTER — Ambulatory Visit (INDEPENDENT_AMBULATORY_CARE_PROVIDER_SITE_OTHER): Payer: Medicare Other | Admitting: Internal Medicine

## 2018-04-07 VITALS — BP 120/70 | HR 65 | Ht 61.0 in | Wt 115.0 lb

## 2018-04-07 DIAGNOSIS — E785 Hyperlipidemia, unspecified: Secondary | ICD-10-CM

## 2018-04-07 DIAGNOSIS — Z01810 Encounter for preprocedural cardiovascular examination: Secondary | ICD-10-CM | POA: Diagnosis not present

## 2018-04-07 DIAGNOSIS — Z8673 Personal history of transient ischemic attack (TIA), and cerebral infarction without residual deficits: Secondary | ICD-10-CM

## 2018-04-07 DIAGNOSIS — I10 Essential (primary) hypertension: Secondary | ICD-10-CM

## 2018-04-07 DIAGNOSIS — I2510 Atherosclerotic heart disease of native coronary artery without angina pectoris: Secondary | ICD-10-CM | POA: Diagnosis not present

## 2018-04-07 NOTE — Progress Notes (Signed)
Follow-up Outpatient Visit Date: 04/07/2018  Primary Care Provider: [name], MD [place] [address]  Chief Complaint: Follow-up coronary artery disease  HPI:  Bianca Taylor is a 84 y.o. year-old female with history of coronary artery disease with inferior STEMI in the setting of acute stroke in5/2018, hypertension, hyperlipidemia, breast cancer, COPD, and GERD, who presents for follow-up of coronary artery disease.  I last saw her in late December, at which time she was feeling well.  Her only complaint was of easy bruising in the setting of dual antiplatelet therapy.  Fortunately, she did not have any residual deficits from her stroke at the time of MI in 01/2017.  Today, Bianca Taylor is doing well.  She has not had any chest pain, shortness of breath, palpitations, lightheadedness, falls, or new neurologic changes.  She and her husband have noted an intermittent cough, predominantly after eating.  Given history of stroke, Dr. Caryl Comes is concerned about aspiration and has referred Ms. Mayol for a swallow study.  Bruising has decreased after discontinuation of clopidogrel this spring.  Ms. Qadir is scheduled for right eyelid surgery in a few weeks with Dr. Vickki Muff and inquires about whether or not aspirin should be continued in the perioperative period.  --------------------------------------------------------------------------------------------------  Past Medical History:  Diagnosis Date  . Breast cancer ([place])    left 2013  . COPD (chronic obstructive pulmonary disease) ([place])   . GERD (gastroesophageal reflux disease)   . Hypercholesterolemia   . Hypertension   . Ischemic cardiomyopathy    a. 01/2017 Echo: EF 45-50%, mod inflat and inf HK, Gr1 DD.  Marland Kitchen ST elevation myocardial infarction (STEMI) of inferior wall ([place])    a. 01/2017 in setting of acute stroke-->medically managed.  . Stroke ([place])    a. 01/2017 MRI: 1 cm acute ischemic  nonhemorrhagic lacunar type infarct involving the left lentiform nucleus. Mod cerebral atrophy, chronic microvascular isch dzs, multiple scattered remote infarcts, extensive chrnoic micro hemorrhages;  b. 01/2016 CTA head/neck: no acute IC/EC stenosis or dissection.   Past Surgical History:  Procedure Laterality Date  . BRAIN SURGERY    . BREAST BIOPSY Left    positive 03/2012  . BREAST BIOPSY Bilateral    negative 2000  . BREAST EXCISIONAL BIOPSY Left    positive 04/2012  . BREAST LUMPECTOMY Left 2013  . BREAST MAMMOSITE Left   . CATARACT EXTRACTION      Current Meds  Medication Sig  . acetaminophen (TYLENOL) 325 MG tablet Take 2 tablets (650 mg total) by mouth every 6 (six) hours as needed for mild pain (or Fever >/= 101).  Marland Kitchen aspirin EC 81 MG tablet Take 81 mg by mouth at bedtime.   Marland Kitchen atorvastatin (LIPITOR) 40 MG tablet TAKE ONE TABLET BY MOUTH EVERY DAY AT 6 IN THE EVENING  . butalbital-acetaminophen-caffeine (FIORICET, ESGIC) 50-325-40 MG tablet Take 1 tablet by mouth every 4 (four) hours as needed for headache.   . metoprolol tartrate (LOPRESSOR) 25 MG tablet Take 0.5 tablets (12.5 mg total) by mouth 2 (two) times daily.  . pantoprazole (PROTONIX) 40 MG tablet Take 40 mg daily by mouth.  . telmisartan (MICARDIS) 80 MG tablet Take 80 mg by mouth at bedtime.  Marland Kitchen zolpidem (AMBIEN) 5 MG tablet Take 5 mg by mouth at bedtime as needed for sleep.    Allergies: Codeine; Fluocinolone; Moxifloxacin; Pravastatin; Risedronate; Tetracyclines & related; and Niacin  Social History   Tobacco Use  . Smoking  status: Never Smoker  . Smokeless tobacco: Never Used  Substance Use Topics  . Alcohol use: No  . Drug use: Not on file    Family History  Problem Relation Age of Onset  . Breast cancer Sister 20    Review of Systems: A 12-system review of systems was performed and was negative except as noted in the  HPI.  --------------------------------------------------------------------------------------------------  Physical Exam: BP 120/70 (BP Location: Left Arm, Patient Position: Sitting, Cuff Size: Normal)   Pulse 65   Ht 5\' 1"  (1.549 m)   Wt 115 lb (52.2 kg)   BMI 21.73 kg/m   General: NAD.  Accompanied by her husband. HEENT: No conjunctival pallor or scleral icterus. Moist mucous membranes.  OP clear. Neck: Supple without lymphadenopathy, thyromegaly, JVD, or HJR. Lungs: Normal work of breathing. Clear to auscultation bilaterally without wheezes or crackles. Heart: Regular rate and rhythm without murmurs, rubs, or gallops. Non-displaced PMI. Abd: Bowel sounds present. Soft, NT/ND without hepatosplenomegaly Ext: No lower extremity edema. Radial, PT, and DP pulses are 2+ bilaterally. Skin: Warm and dry without rash.  EKG: Normal sinus rhythm with inferior infarct.  No change from prior tracing  Lab Results  Component Value Date   WBC 9.5 02/20/2017   HGB 13.0 02/20/2017   HCT 38.2 02/20/2017   MCV 89.7 02/20/2017   PLT 394 02/20/2017    Lab Results  Component Value Date   NA 138 02/20/2017   K 3.8 02/20/2017   CL 104 02/20/2017   CO2 28 02/20/2017   BUN 14 02/20/2017   CREATININE 0.80 07/28/2017   GLUCOSE 106 (H) 02/20/2017   ALT 23 02/18/2017    Lab Results  Component Value Date   CHOL 171 02/19/2017   HDL 49 02/19/2017   Ammon 99 02/19/2017   TRIG 114 02/19/2017   CHOLHDL 3.5 02/19/2017    --------------------------------------------------------------------------------------------------  ASSESSMENT AND PLAN: Coronary artery disease without angina Ms. Schlichting has recovered well from her inferior STEMI last May.  We will continue with secondary prevention.  Hyperlipidemia Recent outside labs are notable for LDL of 78, which is just above goal (goal less than 70).  We discussed the risks and benefits of escalating atorvastatin to 80 mg daily or adding ezetimibe  but have agreed to continue with current therapy and work on lifestyle modifications.  History of stroke No new neurologic deficits.  Given cough after eating, I agree with swallow study to exclude aspiration.  Hypertension Blood pressure well controlled on metoprolol and telmisartan.  No further changes.  Preoperative cardiovascular risk assessment Ms. Gradillas is low risk from a cardiovascular standpoint for eyelid surgery.  If possible, I would ask that low-dose aspirin be continued in the perioperative period.  If risk for bleeding is high, aspirin should be stopped 5 days prior to the procedure and restarted when felt to be safe by Dr. Vickki Muff.  Follow-up: Return to clinic in 6 months.  Nelva Bush, MD 04/07/2018 10:09 AM

## 2018-04-07 NOTE — Patient Instructions (Signed)
Medication Instructions:  Your physician recommends that you continue on your current medications as directed. Please refer to the Current Medication list given to you today.   Labwork: none  Testing/Procedures: none  Follow-Up: Your physician recommends that you schedule a follow-up appointment in: 6 MONTHS WITH DR END.   If you need a refill on your cardiac medications before your next appointment, please call your pharmacy.   

## 2018-04-09 ENCOUNTER — Other Ambulatory Visit: Payer: Self-pay | Admitting: Internal Medicine

## 2018-04-14 ENCOUNTER — Other Ambulatory Visit: Payer: Self-pay | Admitting: *Deleted

## 2018-04-14 MED ORDER — METOPROLOL TARTRATE 25 MG PO TABS: 12.5000 mg | ORAL_TABLET | Freq: Two times a day (BID) | ORAL | 3 refills | Status: DC

## 2018-04-15 ENCOUNTER — Other Ambulatory Visit: Payer: Self-pay

## 2018-04-15 ENCOUNTER — Encounter: Payer: Self-pay | Admitting: *Deleted

## 2018-04-16 NOTE — Anesthesia Preprocedure Evaluation (Addendum)
Anesthesia Evaluation  Patient identified by MRN, date of birth, ID band Patient awake    Reviewed: Allergy & Precautions, NPO status , Patient's Chart, lab work & pertinent test results  History of Anesthesia Complications Negative for: history of anesthetic complications  Airway Mallampati: III  TM Distance: >3 FB Neck ROM: Full    Dental  (+) Caps   Pulmonary former smoker (quit 1990),    Pulmonary exam normal breath sounds clear to auscultation       Cardiovascular hypertension, + CAD and + Past MI (01/2017; medically managed)  Normal cardiovascular exam Rhythm:Regular Rate:Normal  ECG 04/07/18: unchanged from prior in 08/2017  Preoperative cardiovascular risk assessment Ms. Bjelland is low risk from a cardiovascular standpoint for eyelid surgery.  If possible, I would ask that low-dose aspirin be continued in the perioperative period.  If risk for bleeding is high, aspirin should be stopped 5 days prior to the procedure and restarted when felt to be safe by Dr. Vickki Muff.  Follow-up: Return to clinic in 6 months.  Nelva Bush, MD 04/07/2018 10:09 AM     Neuro/Psych CVA (01/2017), No Residual Symptoms    GI/Hepatic GERD  ,  Endo/Other  negative endocrine ROS  Renal/GU negative Renal ROS     Musculoskeletal   Abdominal   Peds  Hematology Breast CA   Anesthesia Other Findings   Reproductive/Obstetrics                            Anesthesia Physical Anesthesia Plan  ASA: III  Anesthesia Plan: MAC   Post-op Pain Management:    Induction: Intravenous  PONV Risk Score and Plan: 3 and TIVA and Treatment may vary due to age or medical condition  Airway Management Planned: Natural Airway  Additional Equipment:   Intra-op Plan:   Post-operative Plan: Extubation in OR  Informed Consent: I have reviewed the patients History and Physical, chart, labs and discussed the procedure  including the risks, benefits and alternatives for the proposed anesthesia with the patient or authorized representative who has indicated his/her understanding and acceptance.     Plan Discussed with: CRNA  Anesthesia Plan Comments: (Patient confused at times about procedure.  Husband at bedside provided history and consent.)       Anesthesia Quick Evaluation

## 2018-04-21 ENCOUNTER — Telehealth: Payer: Self-pay | Admitting: Internal Medicine

## 2018-04-21 NOTE — Telephone Encounter (Signed)
° °  [facility] Pre-operative Risk Assessment    Request for surgical clearance:  1. What type of surgery is being performed? Entropion Repair   2. When is this surgery scheduled?  04-27-18  3. What type of clearance is required (medical clearance vs. Pharmacy clearance to hold med vs. Both)? pharmacy  4. Are there any medications that need to be held prior to surgery and how long?plavix x 4 days  asa x 12 days  5. Practice name and name of physician performing surgery? [facility] Dr Norlene Duel   6. What is your office phone number [phone]    7.   What is your office fax number  [phone]  8.   Anesthesia type (None, local, MAC, general) ? Not noted    Clarisse Gouge 04/21/2018, 8:28 AM  _________________________________________________________________   (provider comments below)

## 2018-04-21 NOTE — Discharge Instructions (Signed)
INSTRUCTIONS FOLLOWING OCULOPLASTIC SURGERY °AMY M. FOWLER, MD ° °AFTER YOUR EYE SURGERY, THER ARE MANY THINGS THWIHC YOU, THE PATIENT, CAN DO TO ASSURE THE BEST POSSIBLE RESULT FROM YOUR OPERATION.  THIS SHEET SHOULD BE REFERRED TO WHENEVER QUESTIONS ARISE.  IF THERE ARE ANY QUESTIONS NOT ANSWERED HERE, DO NOT HESITATE TO CALL OUR OFFICE AT [phone] OR [phone].  THERE IS ALWAYS [facility] AVAILABLE TO CALL IF QUESTIONS OR PROBLEMS ARISE. ° °VISION: Your vision may be blurred and out of focus after surgery until you are able to stop using your ointment, swelling resolves and your eye(s) heal. This may take 1 to 2 weeks at the least.  If your vision becomes gradually more dim or dark, this is not normal and you need to call our office immediately. ° °EYE CARE: For the first 48 hours after surgery, use ice packs frequently - “20 minutes on, 20 minutes off” - to help reduce swelling and bruising.  Small bags of frozen peas or corn make good ice packs along with cloths soaked in ice water.  If you are wearing a patch or other type of dressing following surgery, keep this on for the amount of time specified by your doctor.  For the first week following surgery, you will need to treat your stitches with great care.  If is OK to shower, but take care to not allow soapy water to run into your eye(s) to help reduce changes of infection.  You may gently clean the eyelashes and around the eye(s) with cotton balls and sterile water, BUT DO NOT RUB THE STITCHES VIGOROUSLY.  Keeping your stitches moist with ointment will help promote healing with minimal scar formation. ° °ACTIVITY: When you leave the surgery center, you should go home, rest and be inactive.  The eye(s) may feel scratchy and keeping the eyes closed will allow for faster healing.  The first week following surgery, avoid straining (anything making the face turn red) or lifting over 20 pounds.  Additionally, avoid bending which causes your head to go below  your waist.  Using your eyes will NOT harm them, so feel free to read, watch television, use the computer, etc as desired.  Driving depends on each individual, so check with your doctor if you have questions about driving. ° °MEDICATIONS:  You will be given a prescription for an ointment to use 4 times a day on your stitches.  You can use the ointment in your eyes if they feel scratchy or irritated.  If you eyelid(s) don’t close completely when you sleep, put some ointment in your eyes before bedtime. ° °EMERGENCY: If you experience SEVERE EYE PAIN OR HEADACHE UNRELIEVED BY TYLENOL OR PERCOCET, NAUSEA OR VOMITING, WORSENING REDNESS, OR WORSENING VISION (ESPECIALLY VISION THAT WA INITIALLY BETTER) CALL [phone] OR [phone] DURING BUSINESS HOURS OR AFTER HOURS. ° °General Anesthesia, Adult, Care After °These instructions provide you with information about caring for yourself after your procedure. Your health care provider may also give you more specific instructions. Your treatment has been planned according to current medical practices, but problems sometimes occur. Call your health care provider if you have any problems or questions after your procedure. °What can I expect after the procedure? °After the procedure, it is common to have: °· Vomiting. °· A sore throat. °· Mental slowness. ° °It is common to feel: °· Nauseous. °· Cold or shivery. °· Sleepy. °· Tired. °· Sore or achy, even in parts of your body where you did not have surgery. ° °  Follow these instructions at home: °For at least 24 hours after the procedure: °· Do not: °? Participate in activities where you could fall or become injured. °? Drive. °? Use heavy machinery. °? Drink alcohol. °? Take sleeping pills or medicines that cause drowsiness. °? Make important decisions or sign legal documents. °? Take care of children on your own. °· Rest. °Eating and drinking °· If you vomit, drink water, juice, or soup when you can drink without  vomiting. °· Drink enough fluid to keep your urine clear or pale yellow. °· Make sure you have little or no nausea before eating solid foods. °· Follow the diet recommended by your health care provider. °General instructions °· Have a responsible adult stay with you until you are awake and alert. °· Return to your normal activities as told by your health care provider. Ask your health care provider what activities are safe for you. °· Take over-the-counter and prescription medicines only as told by your health care provider. °· If you smoke, do not smoke without supervision. °· Keep all follow-up visits as told by your health care provider. This is important. °Contact a health care provider if: °· You continue to have nausea or vomiting at home, and medicines are not helpful. °· You cannot drink fluids or start eating again. °· You cannot urinate after 8-12 hours. °· You develop a skin rash. °· You have fever. °· You have increasing redness at the site of your procedure. °Get help right away if: °· You have difficulty breathing. °· You have chest pain. °· You have unexpected bleeding. °· You feel that you are having a life-threatening or urgent problem. °This information is not intended to replace advice given to you by your health care provider. Make sure you discuss any questions you have with your health care provider. °Document Released: 12/15/2000 Document Revised: 02/11/2016 Document Reviewed: 08/23/2015 °Elsevier Interactive Patient Education © 2018 [employer] ° °

## 2018-04-21 NOTE — Telephone Encounter (Signed)
Patient no longer on Plavix as it was discontinue 03/04/18 by Dr End. Dr End cleared patient at last office visit in July. Office note faxed to number provided stating clearance and instructions for aspirin.

## 2018-04-23 ENCOUNTER — Ambulatory Visit (INDEPENDENT_AMBULATORY_CARE_PROVIDER_SITE_OTHER): Payer: Medicare Other | Admitting: Internal Medicine

## 2018-04-23 ENCOUNTER — Encounter: Payer: Self-pay | Admitting: Internal Medicine

## 2018-04-23 VITALS — BP 120/68 | HR 75 | Resp 16 | Ht 61.0 in | Wt 113.0 lb

## 2018-04-23 DIAGNOSIS — I2510 Atherosclerotic heart disease of native coronary artery without angina pectoris: Secondary | ICD-10-CM

## 2018-04-23 DIAGNOSIS — R1310 Dysphagia, unspecified: Secondary | ICD-10-CM

## 2018-04-23 DIAGNOSIS — T17908A Unspecified foreign body in respiratory tract, part unspecified causing other injury, initial encounter: Secondary | ICD-10-CM

## 2018-04-23 NOTE — Patient Instructions (Signed)
Patient needs Swallow and Speech Evaluation

## 2018-04-23 NOTE — Progress Notes (Signed)
   Name: Mixtli Reno MRN: [id number] DOB: 02/03/1930     CONSULTATION DATE: (Not on file)  REFERRING MD : Kerrin Mo  CHIEF COMPLAINT:  cough  STUDIES:  CT chest 11/18 RLL ground glass subpleural opacity approx 1.2 CM Stable from previous CT chest in 01/2017 I have Independently reviewed images of  CT chest       HISTORY OF PRESENT ILLNESS:  Follow-up chronic cough It seems that patient has coughing spells every time she eats or drinks anything No signs of respiratory distress at this time No signs of infection at this time  She was diagnosed with stroke and acute MI approximately 1 year ago No signs of focal deficits at this time  Patient will need further evaluation with speech and swallow eval     REVIEW OF SYSTEMS:   Constitutional: Negative for fever, chills, weight loss, malaise/fatigue and diaphoresis.  HENT: Negative for hearing loss, ear pain, nosebleeds, congestion, sore throat, neck pain, tinnitus and ear discharge.   Eyes: Negative for blurred vision, double vision, photophobia, pain, discharge and redness.  Respiratory: + Cough, hemoptysis, sputum production, shortness of breath, wheezing and stridor.   Cardiovascular: Negative for chest pain, palpitations, orthopnea, claudication, leg swelling and PND.  ALL OTHER ROS ARE NEGATIVE   BP 120/68 (BP Location: Left Arm, Cuff Size: Normal)   Pulse 75   Resp 16   Ht 5\' 1"  (1.549 m)   Wt 113 lb (51.3 kg)   SpO2 95%   BMI 21.35 kg/m    Physical Examination:   GENERAL:NAD, no fevers, chills, no weakness no fatigue HEAD: Normocephalic, atraumatic.  EYES: Pupils equal, round, reactive to light. Extraocular muscles intact. No scleral icterus.  MOUTH: Moist mucosal membrane.   EAR, NOSE, THROAT: Clear without exudates. No external lesions.  NECK: Supple. No thyromegaly. No nodules. No JVD.  PULMONARY:CTA B/L no wheezes, no crackles, no rhonchi CARDIOVASCULAR: S1 and S2. Regular rate and rhythm. No  murmurs, rubs, or gallops. No edema.  GASTROINTESTINAL: Soft, nontender, nondistended. No masses. Positive bowel sounds.  MUSCULOSKELETAL: No swelling, clubbing, or edema. Range of motion full in all extremities.  NEUROLOGIC: Cranial nerves II through XII are intact. No gross focal neurological deficits.  SKIN: No ulceration, lesions, rashes, or cyanosis. Skin warm and dry. Turgor intact.  PSYCHIATRIC: Mood, affect within normal limits. The patient is awake, alert and oriented x 3. Insight, judgment intact.       ASSESSMENT / PLAN: 84 year old pleasant white female seen today for chronic cough in the setting of probable aspiration of secretions every time she eats or drinks anything  No evidence of wheezing at this time No indication for steroids or inhalers at this time  I do recommend speech and swallow evaluation Will place referral for that as soon as possible   Patient/Family are satisfied with Plan of action and management. All questions answered  Corrin Parker, M.D.  [facility]  Medical Director [place] Director [facility] Cardio-Pulmonary Department

## 2018-04-27 ENCOUNTER — Encounter
Admission: RE | Disposition: A | Discharge status: Home / Self Care | Payer: Self-pay | Source: Ambulatory Visit | Attending: Ophthalmology

## 2018-04-27 ENCOUNTER — Ambulatory Visit: Payer: Medicare Other | Admitting: Anesthesiology

## 2018-04-27 ENCOUNTER — Ambulatory Visit
Admission: RE | Admit: 2018-04-27 | Discharge: 2018-04-27 | Disposition: A | Discharge status: Home / Self Care | Payer: Medicare Other | Source: Ambulatory Visit | Attending: Ophthalmology | Admitting: Ophthalmology

## 2018-04-27 DIAGNOSIS — Z9849 Cataract extraction status, unspecified eye: Secondary | ICD-10-CM | POA: Diagnosis not present

## 2018-04-27 DIAGNOSIS — Z8673 Personal history of transient ischemic attack (TIA), and cerebral infarction without residual deficits: Secondary | ICD-10-CM | POA: Insufficient documentation

## 2018-04-27 DIAGNOSIS — I2510 Atherosclerotic heart disease of native coronary artery without angina pectoris: Secondary | ICD-10-CM | POA: Diagnosis not present

## 2018-04-27 DIAGNOSIS — Z853 Personal history of malignant neoplasm of breast: Secondary | ICD-10-CM | POA: Diagnosis not present

## 2018-04-27 DIAGNOSIS — H02042 Spastic entropion of right lower eyelid: Secondary | ICD-10-CM | POA: Diagnosis not present

## 2018-04-27 DIAGNOSIS — K219 Gastro-esophageal reflux disease without esophagitis: Secondary | ICD-10-CM | POA: Insufficient documentation

## 2018-04-27 DIAGNOSIS — Z87891 Personal history of nicotine dependence: Secondary | ICD-10-CM | POA: Insufficient documentation

## 2018-04-27 DIAGNOSIS — E7800 Pure hypercholesterolemia, unspecified: Secondary | ICD-10-CM | POA: Insufficient documentation

## 2018-04-27 DIAGNOSIS — I252 Old myocardial infarction: Secondary | ICD-10-CM | POA: Insufficient documentation

## 2018-04-27 DIAGNOSIS — Z885 Allergy status to narcotic agent status: Secondary | ICD-10-CM | POA: Diagnosis not present

## 2018-04-27 DIAGNOSIS — Z85828 Personal history of other malignant neoplasm of skin: Secondary | ICD-10-CM | POA: Insufficient documentation

## 2018-04-27 DIAGNOSIS — H02002 Unspecified entropion of right lower eyelid: Secondary | ICD-10-CM | POA: Diagnosis not present

## 2018-04-27 DIAGNOSIS — I10 Essential (primary) hypertension: Secondary | ICD-10-CM | POA: Insufficient documentation

## 2018-04-27 DIAGNOSIS — H02009 Unspecified entropion of unspecified eye, unspecified eyelid: Secondary | ICD-10-CM | POA: Diagnosis present

## 2018-04-27 DIAGNOSIS — F0390 Unspecified dementia without behavioral disturbance: Secondary | ICD-10-CM | POA: Insufficient documentation

## 2018-04-27 HISTORY — PX: ENTROPIAN REPAIR: SHX1512

## 2018-04-27 SURGERY — REPAIR, ENTROPION
Anesthesia: Monitor Anesthesia Care | Site: Eye | Laterality: Right | Wound class: Clean

## 2018-04-27 MED ORDER — DEXMEDETOMIDINE HCL 200 MCG/2ML IV SOLN
INTRAVENOUS | Status: DC | PRN
  Administered 2018-04-27 (×4): 4 ug via INTRAVENOUS

## 2018-04-27 MED ORDER — ERYTHROMYCIN 5 MG/GM OP OINT: TOPICAL_OINTMENT | OPHTHALMIC | 2 refills | Status: DC

## 2018-04-27 MED ORDER — LIDOCAINE-EPINEPHRINE 2 %-1:100000 IJ SOLN
INTRAMUSCULAR | Status: DC | PRN
  Administered 2018-04-27: 3 mL via OPHTHALMIC

## 2018-04-27 MED ORDER — ALFENTANIL 500 MCG/ML IJ INJ
INJECTION | INTRAVENOUS | Status: DC | PRN
  Administered 2018-04-27: 500 ug via INTRAVENOUS
  Administered 2018-04-27: 200 ug via INTRAVENOUS

## 2018-04-27 MED ORDER — LACTATED RINGERS IV SOLN: 10.0000 mL/h | INTRAVENOUS | Status: DC

## 2018-04-27 MED ORDER — LACTATED RINGERS IV SOLN
INTRAVENOUS | Status: DC | PRN
Start: 2018-04-27 — End: 2018-04-27
  Administered 2018-04-27: 12:00:00 via INTRAVENOUS

## 2018-04-27 MED ORDER — TETRACAINE HCL 0.5 % OP SOLN
OPHTHALMIC | Status: DC | PRN
  Administered 2018-04-27: 2 [drp] via OPHTHALMIC

## 2018-04-27 MED ORDER — ERYTHROMYCIN 5 MG/GM OP OINT
TOPICAL_OINTMENT | OPHTHALMIC | Status: DC | PRN
  Administered 2018-04-27: 1 via OPHTHALMIC

## 2018-04-27 MED ORDER — BSS IO SOLN
INTRAOCULAR | Status: DC | PRN
  Administered 2018-04-27: 3 mL

## 2018-04-27 MED ORDER — ONDANSETRON HCL 4 MG/2ML IJ SOLN: 4.0000 mg | Freq: Once | INTRAMUSCULAR | Status: DC | PRN

## 2018-04-27 SURGICAL SUPPLY — 38 items
APPLICATOR COTTON TIP WD 3 STR (MISCELLANEOUS) ×6 IMPLANT
BLADE SURG 15 STRL LF DISP TIS (BLADE) ×1 IMPLANT
BLADE SURG 15 STRL SS (BLADE) ×3
CORD BIP STRL DISP 12FT (MISCELLANEOUS) ×3 IMPLANT
DRAPE HEAD BAR (DRAPES) ×3 IMPLANT
GAUZE SPONGE 4X4 12PLY STRL (GAUZE/BANDAGES/DRESSINGS) ×3 IMPLANT
GAUZE SPONGE NON-WVN 2X2 STRL (MISCELLANEOUS) ×10 IMPLANT
GLOVE SURG LX 7.0 MICRO (GLOVE) ×4
GLOVE SURG LX STRL 7.0 MICRO (GLOVE) ×2 IMPLANT
MARKER SKIN XFINE TIP W/RULER (MISCELLANEOUS) ×3 IMPLANT
NDL FILTER BLUNT 18X1 1/2 (NEEDLE) ×1 IMPLANT
NDL HYPO 30X.5 LL (NEEDLE) ×2 IMPLANT
NEEDLE FILTER BLUNT 18X 1/2SAF (NEEDLE) ×2
NEEDLE FILTER BLUNT 18X1 1/2 (NEEDLE) ×1 IMPLANT
NEEDLE HYPO 30X.5 LL (NEEDLE) ×6 IMPLANT
PACK DRAPE NASAL/ENT (PACKS) ×3 IMPLANT
SOL PREP PVP 2OZ (MISCELLANEOUS) ×3
SOLUTION PREP PVP 2OZ (MISCELLANEOUS) ×1 IMPLANT
SPONGE VERSALON 2X2 STRL (MISCELLANEOUS) ×30
SUT CHROMIC 4-0 (SUTURE) ×6
SUT CHROMIC 4-0 M2 12X2 ARM (SUTURE) ×2
SUT CHROMIC 5 0 P 3 (SUTURE) IMPLANT
SUT ETHILON 4 0 CL P 3 (SUTURE) IMPLANT
SUT MERSILENE 4-0 S-2 (SUTURE) ×3 IMPLANT
SUT PDS AB 4-0 P3 18 (SUTURE) IMPLANT
SUT PLAIN GUT (SUTURE) ×3 IMPLANT
SUT PROLENE 5 0 P 3 (SUTURE) IMPLANT
SUT PROLENE 6 0 P 1 18 (SUTURE) IMPLANT
SUT SILK 4 0 G 3 (SUTURE) IMPLANT
SUT VIC AB 5-0 P-3 18X BRD (SUTURE) IMPLANT
SUT VIC AB 5-0 P3 18 (SUTURE)
SUT VICRYL 6-0  S14 CTD (SUTURE)
SUT VICRYL 6-0 S14 CTD (SUTURE) IMPLANT
SUT VICRYL 7 0 TG140 8 (SUTURE) IMPLANT
SUTURE CHRMC 4-0 M2 12X2 ARM (SUTURE) IMPLANT
SYR 10ML LL (SYRINGE) ×3 IMPLANT
SYR 3ML LL SCALE MARK (SYRINGE) ×3 IMPLANT
WATER STERILE IRR 250ML POUR (IV SOLUTION) ×3 IMPLANT

## 2018-04-27 NOTE — Interval H&P Note (Signed)
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 After consideration of risks, benefits and other options for treatment, the patient has consented to  Procedure(s): REPAIR OF ENTROPION, SUTURES AND EXTENSIVE (Right) as a surgical intervention .  The patient's history has been reviewed, patient examined, no change in status, stable for surgery.  I have reviewed the patient's chart and labs.  Questions were answered to the patient's satisfaction.     Vickki Muff, Sruti Ayllon M

## 2018-04-27 NOTE — Transfer of Care (Signed)
Immediate Anesthesia Transfer of Care Note  Patient: Bianca Taylor  Procedure(s) Performed: REPAIR OF ENTROPION, SUTURES AND EXTENSIVE (Right Eye)  Patient Location: PACU  Anesthesia Type: MAC  Level of Consciousness: awake, alert  and patient cooperative  Airway and Oxygen Therapy: Patient Spontanous Breathing and Patient connected to supplemental oxygen  Post-op Assessment: Post-op Vital signs reviewed, Patient's Cardiovascular Status Stable, Respiratory Function Stable, Patent Airway and No signs of Nausea or vomiting  Post-op Vital Signs: Reviewed and stable  Complications: No apparent anesthesia complications

## 2018-04-27 NOTE — Op Note (Signed)
Preoperative Diagnosis:   1.  Lower eyelid laxity with entropion, right lower eyelid(s). 2.  Spastic Entropion right lower eyelid(s)  Postoperative Diagnosis:   Same.  Procedure(s) Performed:  1.  Lateral tarsal strip procedure, right lower eyelid(s). 2.  Quickert Sutures for entropion right lower eyelid(s)  Surgeon: Philis Pique. Vickki Muff, M.D.  Assistants: none  Anesthesia: MAC  Specimens: None.  Estimated Blood Loss: Minimal.  Complications: None.  Operative Findings: None   Procedure:   Allergies were reviewed and the patient Codeine; Fluocinolone; Moxifloxacin; Pravastatin; Risedronate; Tetracyclines & related; and Niacin.    After discussing the risks, benefits, complications, and alternatives with the patient, appropriate informed consent was obtained. The patient was brought to the operating suite and reclined supine. Time out was conducted and the patient was sedated.  Local anesthetic consisting of a 50-50 mixture of 2% lidocaine with epinephrine and 0.75% bupivacaine with added Hylenex was injected subcutaneously to the right lateral canthal region(s) and lower eyelid(s). Additional anesthetic was injected subconjunctivally to the right lower eyelid(s). Finally, anesthetic was injected down to the periosteum of the right lateral orbital rim(s).  After adequate local was instilled, the patient was prepped and draped in the usual sterile fashion for eyelid surgery. Attention was turned to the right lateral canthal angle. Westcott scissors were used to create a lateral canthotomy. Hemostasis was obtained with bipolar cautery. An inferior cantholysis was then performed with additional bipolar hemostasis. The anterior and posterior lamella of the lid were divided for approximately 8 mm.  A strip of the epithelium was excised off the superior margin of the tarsal strip and conjunctiva and retractors were incised off the inferior margin of the tarsal strip.   Two double-armed 4-0 chromic  sutures were passed medially and laterally through the lower lid from the conjunctival fornix, anteriorly through the skin just below the border of the tarsal plate. Each of these sutures was tied off and this created a nice outward rolling of the lid margin.  A double-armed 4-0 M[place]ilene suture was then passed each arm through the terminal portion of the tarsal strip. Each arm of the suture was then passed through the periosteum of the inner portion of the lateral orbital rim at the level of Whitnall's tubercle. The sutures were advanced and this provided nice elevation and tightening of the lower eyelid. Once the suture was secured, a thin strip of follicle-bearing skin was excised. The lateral canthal angle was reformed with an interrupted 6-0 fast absorbing plain suture. Orbicularis was reapproximated with horizontal subcuticular 6-0 fast absorbing plain gut sutures. The skin was closed with interrupted 6-0 fast absorbing plain gut sutures.   Attention was then turned to the opposite eyelid where the same procedure was performed in the same manner.   The patient tolerated the procedure well.  Erythromycin ophthalmic ointment was applied to the incision site(s) followed by ice packs. The patient was taken to the recovery area where she recovered without difficulty.  Post-Op Plan/Instructions:  The patient was instructed to use ice packs frequently for the next 48 hours. She was instructed to use erythromycin ophthalmic ointment on her incisions 4 times a day for the next 12 to 14 days. She was directed to use Tylenol for pain control. She was asked to to follow up at the [facility] in [place], [place] in 3-4 weeks' time or sooner as needed for problems.   Vannesa Abair M. Vickki Muff, M.D. Ophthalmology

## 2018-04-27 NOTE — Anesthesia Procedure Notes (Signed)
Procedure Name: MAC Date/Time: 04/27/2018 12:02 PM Performed by: Lind Guest, CRNA Pre-anesthesia Checklist: Patient identified, Emergency Drugs available, Suction available, Patient being monitored and Timeout performed Patient Re-evaluated:Patient Re-evaluated prior to induction Oxygen Delivery Method: Nasal cannula

## 2018-04-27 NOTE — Anesthesia Postprocedure Evaluation (Signed)
Anesthesia Post Note  Patient: Bianca Taylor  Procedure(s) Performed: REPAIR OF ENTROPION, SUTURES AND EXTENSIVE (Right Eye)  Patient location during evaluation: PACU Anesthesia Type: MAC Level of consciousness: awake and alert, oriented and patient cooperative Pain management: pain level controlled Vital Signs Assessment: post-procedure vital signs reviewed and stable Respiratory status: spontaneous breathing, nonlabored ventilation and respiratory function stable Cardiovascular status: blood pressure returned to baseline and stable Postop Assessment: adequate PO intake Anesthetic complications: no    Darrin Nipper

## 2018-04-27 NOTE — H&P (Signed)
  See the history and physical completed at [facility] on 04/12/18 and scanned into the chart.

## 2018-04-28 ENCOUNTER — Encounter: Payer: Self-pay | Admitting: Ophthalmology

## 2018-05-17 ENCOUNTER — Ambulatory Visit
Admission: RE | Admit: 2018-05-17 | Discharge: 2018-05-17 | Disposition: A | Discharge status: Home / Self Care | Payer: Medicare Other | Source: Ambulatory Visit | Attending: Internal Medicine | Admitting: Internal Medicine

## 2018-05-17 DIAGNOSIS — R1312 Dysphagia, oropharyngeal phase: Secondary | ICD-10-CM | POA: Diagnosis not present

## 2018-05-17 NOTE — Therapy (Signed)
[place], [place], [address] Phone: [phone]   Fax:     Modified Barium Swallow  Patient Details  Name: Bianca Taylor MRN: [id number] Date of Birth: Aug 26, 1930 No data recorded  Encounter Date: 05/17/2018    Past Medical History:  Diagnosis Date  . Breast cancer ([place])    left 2013  . COPD (chronic obstructive pulmonary disease) ([place])   . GERD (gastroesophageal reflux disease)   . Hypercholesterolemia   . Hypertension   . Ischemic cardiomyopathy    a. 01/2017 Echo: EF 45-50%, mod inflat and inf HK, Gr1 DD.  Marland Kitchen ST elevation myocardial infarction (STEMI) of inferior wall ([place])    a. 01/2017 in setting of acute stroke-->medically managed.  . Stroke ([place])    a. 01/2017 MRI: 1 cm acute ischemic nonhemorrhagic lacunar type infarct involving the left lentiform nucleus. Mod cerebral atrophy, chronic microvascular isch dzs, multiple scattered remote infarcts, extensive chrnoic micro hemorrhages;  b. 01/2016 CTA head/neck: no acute IC/EC stenosis or dissection.    Past Surgical History:  Procedure Laterality Date  . BRAIN SURGERY    . BREAST BIOPSY Left    positive 03/2012  . BREAST BIOPSY Bilateral    negative 2000  . BREAST EXCISIONAL BIOPSY Left    positive 04/2012  . BREAST LUMPECTOMY Left 2013  . BREAST MAMMOSITE Left   . CATARACT EXTRACTION    . ENTROPIAN REPAIR Right 04/27/2018   Procedure: REPAIR OF ENTROPION, SUTURES AND EXTENSIVE;  Surgeon: Karle Starch, MD;  Location: [facility];  Service: Ophthalmology;  Laterality: Right;    There were no vitals filed for this visit.     Subjective: Patient behavior: (alertness, ability to follow instructions, etc.):  The patient is alert and able to follow directions  Chief complaint: Per report, mealtime coughing spells, which the patient denies   Objective:  Radiological Procedure: A videoflouroscopic evaluation of oral-preparatory,  reflex initiation, and pharyngeal phases of the swallow was performed; as well as a screening of the upper esophageal phase.  I. POSTURE: Upright in MBS chair  II. VIEW: Lateral  III. COMPENSATORY STRATEGIES: N/A  IV. BOLUSES ADMINISTERED:   Thin Liquid: 1 small, 3 rapid consecutive   Nectar-thick Liquid: 1 moderate   Honey-thick Liquid: DNT   Puree: 2 teaspoon presentations   Mechanical Soft: 1/4 graham cracker in applesauce  V. RESULTS OF EVALUATION: A. ORAL PREPARATORY PHASE: (The lips, tongue, and velum are observed for strength and coordination)       **Overall Severity Rating: Mild; slow/disorganized posterior transfer with mild oral residue post swallow  B. SWALLOW INITIATION/REFLEX: (The reflex is normal if "triggered" by the time the bolus reached the base of the tongue)  **Overall Severity Rating: Mild; triggers while falling from the valleculae to the pyriform sinuses  C. PHARYNGEAL PHASE: (Pharyngeal function is normal if the bolus shows rapid, smooth, and continuous transit through the pharynx and there is no pharyngeal residue after the swallow)  **Overall Severity Rating: Mild; decreased pharyngeal pressure generation with coating of residue  D. LARYNGEAL PENETRATION: (Material entering into the laryngeal inlet/vestibule but not aspirated) None  E. ASPIRATION: None  F. ESOPHAGEAL PHASE: (Screening of the upper esophagus): No observed abnormality within the viewable cervical esophagus  ASSESSMENT: This 84 year old woman; with report of coughing spells every time she eats or drinks anything; is presenting with mild oropharyngeal dysphagia characterized by prolonged anterior-posterior transfer, mild oral residue, delayed pharyngeal swallow initiation,  reduced pharyngeal pressure generation, and coating of residue throughout the pharynx.  There was no observed laryngeal penetration or tracheal aspiration.  The patient appears to be distractible, which may be the cause  of mealtime coughing.  The patient was counseled to be mindful while eating and drinking.  PLAN/RECOMMENDATIONS:   A. Diet: Regular   B. Swallowing Precautions: Pay attention to eating and drinking   C. Recommended consultation to: follow up with MDs as recommended   D. Therapy recommendations: speech therapy is not indicated   E. Results and recommendations were discussed with the patient and her husband immediately following the study and the final report routed to the referring MD and PCP   Oropharyngeal dysphagia - Plan: DG OP Swallowing Func-Medicare/Speech Path, DG OP Swallowing Func-Medicare/Speech Path        Problem List Patient Active Problem List   Diagnosis Date Noted  . Fibromyalgia 06/25/2017  . Essential hypertension 06/25/2017  . GERD (gastroesophageal reflux disease) 06/25/2017  . History of stroke 06/10/2017  . Hyperlipidemia LDL goal <70 06/10/2017  . Coronary artery disease involving native coronary artery of native heart 03/11/2017   Leroy Sea, MS/CCC- SLP  Lou Miner 05/17/2018, 1:27 PM  [facility] [place], [place], [address] Phone: [phone]   Fax:     Name: Bianca Taylor MRN: [id number] Date of Birth: 12-24-1929

## 2018-07-14 ENCOUNTER — Other Ambulatory Visit: Payer: Self-pay | Admitting: Internal Medicine

## 2018-07-14 DIAGNOSIS — Z1231 Encounter for screening mammogram for malignant neoplasm of breast: Secondary | ICD-10-CM

## 2018-08-18 ENCOUNTER — Ambulatory Visit
Admission: RE | Admit: 2018-08-18 | Discharge: 2018-08-18 | Disposition: A | Discharge status: Home / Self Care | Payer: Medicare Other | Source: Ambulatory Visit | Attending: Internal Medicine | Admitting: Internal Medicine

## 2018-08-18 DIAGNOSIS — Z1231 Encounter for screening mammogram for malignant neoplasm of breast: Secondary | ICD-10-CM | POA: Insufficient documentation

## 2018-11-05 ENCOUNTER — Encounter: Payer: Self-pay | Admitting: Internal Medicine

## 2018-11-05 ENCOUNTER — Ambulatory Visit (INDEPENDENT_AMBULATORY_CARE_PROVIDER_SITE_OTHER): Payer: Medicare Other | Admitting: Internal Medicine

## 2018-11-05 VITALS — BP 122/70 | HR 79 | Wt 102.0 lb

## 2018-11-05 DIAGNOSIS — R059 Cough, unspecified: Secondary | ICD-10-CM

## 2018-11-05 DIAGNOSIS — R05 Cough: Secondary | ICD-10-CM

## 2018-11-05 DIAGNOSIS — I2510 Atherosclerotic heart disease of native coronary artery without angina pectoris: Secondary | ICD-10-CM | POA: Diagnosis not present

## 2018-11-05 DIAGNOSIS — I10 Essential (primary) hypertension: Secondary | ICD-10-CM

## 2018-11-05 DIAGNOSIS — Z79899 Other long term (current) drug therapy: Secondary | ICD-10-CM

## 2018-11-05 DIAGNOSIS — I491 Atrial premature depolarization: Secondary | ICD-10-CM

## 2018-11-05 DIAGNOSIS — E785 Hyperlipidemia, unspecified: Secondary | ICD-10-CM

## 2018-11-05 DIAGNOSIS — I255 Ischemic cardiomyopathy: Secondary | ICD-10-CM | POA: Diagnosis not present

## 2018-11-05 MED ORDER — ATORVASTATIN CALCIUM 80 MG PO TABS: 80.0000 mg | ORAL_TABLET | Freq: Every day | ORAL | 3 refills | Status: DC

## 2018-11-05 NOTE — Progress Notes (Signed)
Follow-up Outpatient Visit Date: 11/05/2018  Primary Care Provider: [name], MD [place] [address]  Chief Complaint: Follow-up CAD  HPI:  Bianca Taylor is a 85 y.o. year-old female with history of coronary artery disease with inferior STEMI in the setting of acute stroke in5/2018 (MI managed medically), hypertension, hyperlipidemia, breast cancer, COPD, and GERD, who presents for follow-up of coronary artery disease.  I last saw Bianca Taylor in July, at which time she was doing well.  We did not make any medication changes or pursue additional testing at that time.  Today, Bianca Taylor reports that she is feeling well other than chronic cough with extensive w/u by PCP, ENT, and pulmonary without clear etiology.  She sometimes has white sputum production.  She denies chest pain, shortness of breath, palpitations, lightheadedness, orthopnea, and edema.  Her aspirin was decreased to 3x/week at one point due to bruising, though she is back on daily dosing at the recommendation of her PCP.  --------------------------------------------------------------------------------------------------  Past Medical History:  Diagnosis Date  . Breast cancer ([place]) 2013   left breast  . COPD (chronic obstructive pulmonary disease) ([place])   . GERD (gastroesophageal reflux disease)   . Hypercholesterolemia   . Hypertension   . Ischemic cardiomyopathy    a. 01/2017 Echo: EF 45-50%, mod inflat and inf HK, Gr1 DD.  Marland Kitchen ST elevation myocardial infarction (STEMI) of inferior wall ([place])    a. 01/2017 in setting of acute stroke-->medically managed.  . Stroke ([place])    a. 01/2017 MRI: 1 cm acute ischemic nonhemorrhagic lacunar type infarct involving the left lentiform nucleus. Mod cerebral atrophy, chronic microvascular isch dzs, multiple scattered remote infarcts, extensive chrnoic micro hemorrhages;  b. 01/2016 CTA head/neck: no acute IC/EC stenosis or dissection.   Past  Surgical History:  Procedure Laterality Date  . BRAIN SURGERY    . BREAST BIOPSY Left    positive 03/2012  . BREAST BIOPSY Bilateral    negative 2000  . BREAST EXCISIONAL BIOPSY Left    positive 04/2012  . BREAST LUMPECTOMY Left 2013  . BREAST MAMMOSITE Left   . CATARACT EXTRACTION    . ENTROPIAN REPAIR Right 04/27/2018   Procedure: REPAIR OF ENTROPION, SUTURES AND EXTENSIVE;  Surgeon: Karle Starch, MD;  Location: [facility];  Service: Ophthalmology;  Laterality: Right;     Recent CV Pertinent Labs: Lab Results  Component Value Date   CHOL 171 02/19/2017   HDL 49 02/19/2017   LDLCALC 99 02/19/2017   TRIG 114 02/19/2017   CHOLHDL 3.5 02/19/2017   INR 1.06 02/18/2017   K 3.8 02/20/2017   K 4.0 07/15/2012   BUN 14 02/20/2017   BUN 11 07/15/2012   CREATININE 0.80 07/28/2017   CREATININE 0.68 01/05/2013    Past medical and surgical history were reviewed and updated in EPIC.  Current Meds  Medication Sig  . acetaminophen (TYLENOL) 325 MG tablet Take 2 tablets (650 mg total) by mouth every 6 (six) hours as needed for mild pain (or Fever >/= 101).  Marland Kitchen aspirin EC 81 MG tablet Take 81 mg by mouth at bedtime.   Marland Kitchen atorvastatin (LIPITOR) 40 MG tablet TAKE ONE TABLET BY MOUTH EVERY DAY AT 6 IN THE EVENING  . butalbital-acetaminophen-caffeine (FIORICET, ESGIC) 50-325-40 MG tablet Take 1 tablet by mouth every 4 (four) hours as needed for headache.   . erythromycin ophthalmic ointment Apply to sutures 4 times a day for 10-12 days.  Discontinue if allergy develops and call our office  . metoprolol tartrate (LOPRESSOR) 25 MG tablet Take 0.5 tablets (12.5 mg total) by mouth 2 (two) times daily.  . pantoprazole (PROTONIX) 40 MG tablet Take 40 mg daily by mouth.  . telmisartan (MICARDIS) 80 MG tablet Take 80 mg by mouth at bedtime.  Marland Kitchen zolpidem (AMBIEN) 5 MG tablet Take 5 mg by mouth at bedtime as needed for sleep.    Allergies: Codeine; Fluocinolone; Moxifloxacin; Pravastatin;  Risedronate; Tetracyclines & related; and Niacin  Social History   Tobacco Use  . Smoking status: Former Smoker    Last attempt to quit: 1990    Years since quitting: 30.1  . Smokeless tobacco: Never Used  Substance Use Topics  . Alcohol use: No  . Drug use: Never    Family History  Problem Relation Age of Onset  . Breast cancer Sister 3    Review of Systems: A 12-system review of systems was performed and was negative except as noted in the HPI.  --------------------------------------------------------------------------------------------------  Physical Exam: BP 122/70 (BP Location: Left Arm, Patient Position: Sitting, Cuff Size: Normal)   Pulse 79   Wt 102 lb (46.3 kg)   BMI 19.27 kg/m   General:  NAD HEENT: No conjunctival pallor or scleral icterus. Moist mucous membranes.  OP clear. Neck: Supple without lymphadenopathy, thyromegaly, JVD, or HJR. Lungs: Normal work of breathing. Clear to auscultation bilaterally without wheezes or crackles. Heart: Regular rate and rhythm with occasional extrasystoles.  No murmurs, rubs, or gallops. Non-displaced PMI. Abd: Bowel sounds present. Soft, NT/ND without hepatosplenomegaly Ext: No lower extremity edema. Radial, PT, and DP pulses are 2+ bilaterally. Skin: Warm and dry without rash.  Bruise noted on left shin.  EKG:  NSR with PAC's and inferior MI (old).  Lab Results  Component Value Date   WBC 9.5 02/20/2017   HGB 13.0 02/20/2017   HCT 38.2 02/20/2017   MCV 89.7 02/20/2017   PLT 394 02/20/2017    Lab Results  Component Value Date   NA 138 02/20/2017   K 3.8 02/20/2017   CL 104 02/20/2017   CO2 28 02/20/2017   BUN 14 02/20/2017   CREATININE 0.80 07/28/2017   GLUCOSE 106 (H) 02/20/2017   ALT 23 02/18/2017    Lab Results  Component Value Date   CHOL 171 02/19/2017   HDL 49 02/19/2017   LDLCALC 99 02/19/2017   TRIG 114 02/19/2017   CHOLHDL 3.5 02/19/2017     --------------------------------------------------------------------------------------------------  ASSESSMENT AND PLAN: CAD No symptoms of significant coronary insufficiency (CCS class I).  Exam is unremarkable today.  EKG shows evidence of prior infarct.  Most recent labs by Dr. Caryl Comes notable for suboptimal LDL control.  We will continue ASA 81 mg daily and increase atorvastatin to 80 mg daily.  Cough Chronic and intermittent.  No etiology has been found thus far despite extensive w/u by multiple providers.  As it bothers Bianca Taylor, we have agreed to obtain an echo to exclude worsening cardiomyopathy, as well as a 48 hour Holter monitor to exclude arrhythmogenic cause of her cough, given PAC's noted on EKG today.  PAC's Incidentally noted today.  Patient is asymptomatic at this time, though unexplained cough is sometimes due to transient arrhythmias (I.e. a-fib).  We have agreed to obtain a 48 hour Holter monitor.  Ischemic cardiomyopathy Bianca Taylor appears euvolemic with NYHA class I symptoms.  As above, we will reassess her LVEF with a TTE.  Hyperlipidemia LDL suboptimally controlled on  recent labs by Dr. Caryl Comes (goal < 70; 96 on 10/01/2018).  We will increase atorvastatin to 80 mg daily and recheck a lipid panel and ALT in ~3 months.  Hypertension BP normal.  No medication changes at this time.  Follow-up: Return to clinic in 6 months.  Nelva Bush, MD 11/05/2018 11:41 AM

## 2018-11-05 NOTE — Patient Instructions (Signed)
Medication Instructions:  Your physician has recommended you make the following change in your medication:  1- INCREASE Lipitor to 80 mg by mouth once a day.  If you need a refill on your cardiac medications before your next appointment, please call your pharmacy.   Lab work: Your physician recommends that you return for lab work in: [place].   Feb 03, 2019. - You will need to be fasting. - LIPID, ALT. Please go to the [facility]. You will check in at the front desk to the right as you walk into the atrium. Valet Parking is offered if needed.   If you have labs (blood work) drawn today and your tests are completely normal, you will receive your results only by: Marland Kitchen MyChart Message (if you have MyChart) OR . A paper copy in the mail If you have any lab test that is abnormal or we need to change your treatment, we will call you to review the results.   Testing/Procedures: Your physician has requested that you have an echocardiogram. Echocardiography is a painless test that uses sound waves to create images of your heart. It provides your doctor with information about the size and shape of your heart and how well your heart's chambers and valves are working. This procedure takes approximately one hour. There are no restrictions for this procedure. You may get an IV, if needed, to receive an ultrasound enhancing agent through to better visualize your heart.    Your physician has recommended that you wear a 48 HOUR holter monitor. Holter monitors are medical devices that record the heart's electrical activity. Doctors most often use these monitors to diagnose arrhythmias. Arrhythmias are problems with the speed or rhythm of the heartbeat. The monitor is a small, portable device. You can wear one while you do your normal daily activities. This is usually used to diagnose what is causing palpitations/syncope (passing out).     Follow-Up: At [facility], you and  your health needs are our priority.  As part of our continuing mission to provide you with exceptional heart care, we have created designated Provider Care Teams.  These Care Teams include your primary Cardiologist (physician) and Advanced Practice Providers (APPs -  Physician Assistants and Nurse Practitioners) who all work together to provide you with the care you need, when you need it. You will need a follow up appointment in 6 months.  Please call our office 2 months in advance to schedule this appointment.  You may see DR Harrell Gave END or one of the following Advanced Practice Providers on your designated Care Team:   Murray Hodgkins, NP Christell Faith, PA-C . Marrianne Mood, PA-C     Ambulatory Cardiac Monitoring An ambulatory cardiac monitor is a small recording device that is used to detect abnormal heart rhythms (arrhythmias). Most monitors are connected by wires to flat, sticky disks (electrodes) that are then attached to your chest. You may need to wear a monitor if you have had symptoms such as:  Fast heartbeats (palpitations).  Dizziness.  Fainting or light-headedness.  Unexplained weakness.  Shortness of breath. There are several types of monitors. Some common monitors include:  Holter monitor. This records your heart rhythm continuously, usually for 24-48 hours.  Event (episodic) monitor. This monitor has a symptoms button, and when pushed, it will begin recording. You need to activate this monitor to record when you have a heart-related symptom.  Automatic detection monitor. This monitor will begin recording when it  detects an abnormal heartbeat. What are the risks? Generally, these devices are safe to use. However, it is possible that the skin under the electrodes will become irritated. How to prepare for monitoring Your health care provider will prepare your chest for the electrode placement and show you how to use the monitor.  Do not apply lotions to your chest  before monitoring.  Follow directions on how to care for the monitor, and how to return the monitor when the testing period is complete. How to use your cardiac monitor  Follow directions about how long to wear the monitor, and if you can take the monitor off in order to shower or bathe. ? Do not let the monitor get wet. ? Do not bathe, swim, or use a hot tub while wearing the monitor.  Keep your skin clean. Do not put body lotion or moisturizer on your chest.  Change the electrodes as told by your health care provider, or any time they stop sticking to your skin. You may need to use medical tape to keep them on.  Try to put the electrodes in slightly different places on your chest to help prevent skin irritation. Follow directions from your health care provider about where to place the electrodes.  Make sure the monitor is safely clipped to your clothing or in a location close to your body as recommended by your health care provider.  If your monitor has a symptoms button, press the button to mark an event as soon as you feel a heart-related symptom, such as: ? Dizziness. ? Weakness. ? Light-headedness. ? Palpitations. ? Thumping or pounding in your chest. ? Shortness of breath. ? Unexplained weakness.  Keep a diary of your activities, such as walking, doing chores, and taking medicine. It is very important to note what you were doing when you pushed the button to record your symptoms. This will help your health care provider determine what might be contributing to your symptoms.  Send the recorded information as recommended by your health care provider. It may take some time for your health care provider to process the results.  Change the batteries as told by your health care provider.  Keep electronic devices away from your monitor. These include: ? Tablets. ? MP3 players. ? Cell phones.  While wearing your monitor you should avoid: ? Electric blankets. ? Tax inspector. ? Electric toothbrushes. ? Microwave ovens. ? Magnets. ? Metal detectors. Get help right away if:  You have chest pain.  You have shortness of breath or extreme difficulty breathing.  You develop a very fast heartbeat that does not get better.  You develop dizziness that does not go away.  You faint or constantly feel like you are about to faint. Summary  An ambulatory cardiac monitor is a small recording device that is used to detect abnormal heart rhythms (arrhythmias).  Make sure you understand how to send the information from the monitor to your health care provider.  It is important to press the button on the monitor when you have any heart-related symptoms.  Keep a diary of your activities, such as walking, doing chores, and taking medicine. It is very important to note what you were doing when you pushed the button to record your symptoms. This will help your health care provider learn what might be causing your symptoms. This information is not intended to replace advice given to you by your health care provider. Make sure you discuss any questions you have with your  health care provider. Document Released: 06/17/2008 Document Revised: 06/24/2017 Document Reviewed: 08/23/2016 Elsevier Interactive Patient Education  2019 [employer].

## 2018-11-06 ENCOUNTER — Encounter: Payer: Self-pay | Admitting: Internal Medicine

## 2018-11-06 DIAGNOSIS — I255 Ischemic cardiomyopathy: Secondary | ICD-10-CM | POA: Insufficient documentation

## 2018-11-06 DIAGNOSIS — R059 Cough, unspecified: Secondary | ICD-10-CM | POA: Insufficient documentation

## 2018-11-06 DIAGNOSIS — R05 Cough: Secondary | ICD-10-CM | POA: Insufficient documentation

## 2018-11-06 DIAGNOSIS — I491 Atrial premature depolarization: Secondary | ICD-10-CM | POA: Insufficient documentation

## 2018-12-06 ENCOUNTER — Ambulatory Visit (INDEPENDENT_AMBULATORY_CARE_PROVIDER_SITE_OTHER): Payer: Medicare Other

## 2018-12-06 ENCOUNTER — Other Ambulatory Visit: Payer: Self-pay

## 2018-12-06 DIAGNOSIS — R059 Cough, unspecified: Secondary | ICD-10-CM

## 2018-12-06 DIAGNOSIS — R05 Cough: Secondary | ICD-10-CM

## 2018-12-06 DIAGNOSIS — I2510 Atherosclerotic heart disease of native coronary artery without angina pectoris: Secondary | ICD-10-CM

## 2018-12-06 DIAGNOSIS — I491 Atrial premature depolarization: Secondary | ICD-10-CM | POA: Diagnosis not present

## 2018-12-06 DIAGNOSIS — I255 Ischemic cardiomyopathy: Secondary | ICD-10-CM

## 2018-12-14 ENCOUNTER — Telehealth: Payer: Self-pay | Admitting: Internal Medicine

## 2018-12-14 MED ORDER — METOPROLOL TARTRATE 25 MG PO TABS: 25.0000 mg | ORAL_TABLET | Freq: Two times a day (BID) | ORAL | 3 refills | Status: DC

## 2018-12-14 NOTE — Telephone Encounter (Signed)
I spoke with Ms. Simeone husband regarding the results of the Holter monitor, which showed multiple atrial runs as well as one wide-complex tachycardia run most likely representing SVT with a aberrancy but cannot exclude NSVT.  Episodes did not correlate with reported symptoms by the patient.  However, transient tachyarrhythmias could cause intermittent cough.  We have agreed to increase metoprolol tartrate to 25 mg twice daily.  I have advised the patient and her husband to monitor her blood pressure regularly.  If she develops lightheadedness, dizziness, increasing fatigue, or notices blood pressures routinely less than 100/60, they should contact us to discuss de-escalation of metoprolol and/or telmisartan.  We will also arrange for a virtual visit in about a month to reassess her symptoms.  Nelva Bush, MD [facility] Pager: [phone]

## 2018-12-17 ENCOUNTER — Telehealth: Payer: Self-pay | Admitting: *Deleted

## 2018-12-17 NOTE — Telephone Encounter (Signed)
Pateint and husband are aware.

## 2018-12-17 NOTE — Telephone Encounter (Signed)
-----   Message from Nelva Bush, MD sent at 12/14/2018  3:22 PM EDT ----- Results discussed with patient and her husband by phone.  Given multiple runs of PSVT as well as questionable NSVT versus SVT with aberrancy, will increase metoprolol tartrate to 25 mg twice daily (see phone message for details).

## 2019-01-21 ENCOUNTER — Telehealth: Payer: Self-pay | Admitting: Internal Medicine

## 2019-01-21 NOTE — Telephone Encounter (Signed)
I agree that blood pressure is a bit on the low side.  However, metoprolol was increased due to elevated heart rates and not for blood pressure control.  I recommend continuing metoprolol tartrate 25 mg twice daily and reducing telmisartan from 80 mg daily to 40 mg daily.  If blood pressure does not begin to improve over the next few days, Bianca Taylor should contact us for further instructions.  Nelva Bush, MD [facility] Pager: [phone]

## 2019-01-21 NOTE — Telephone Encounter (Signed)
Please call to discuss pt Metoprolol. BP was 110/45 both times she took it  this morning.

## 2019-01-21 NOTE — Telephone Encounter (Signed)
Returned call to Bianca Taylor.    We discussed advice from Dr. Saunders Revel. He verbalized understanding and had no further questions at this time. He will continue monitoring BP and will call back if BP does not improve.

## 2019-01-21 NOTE — Telephone Encounter (Signed)
Returned call to pt husband. He reports that metoprolol was increased by Dr. Saunders Revel after zio monitor showed runs of fast HR.   Bianca Taylor reports that BP was staying around 115/45 so he cut her medication back down to 12.5 BID last week.   Today BP was 110/45. He is concerned that diastolic BP is too low despite taking 12.5 BID.  I asked him to take her BP while on the phone, BP 138/82, HR 66.   Message routed to provider to further advise.

## 2019-03-14 IMAGING — DX DG WRIST COMPLETE 3+V*L*
4 series · 4 of 4 positions shown · non-contrast
Comparison: Left hand radiographs the same day.

CLINICAL DATA: Fall yesterday.  Wrist and hand pain.

EXAM:
LEFT WRIST - COMPLETE 3+ VIEW

[wrist ap (1 of 2)]
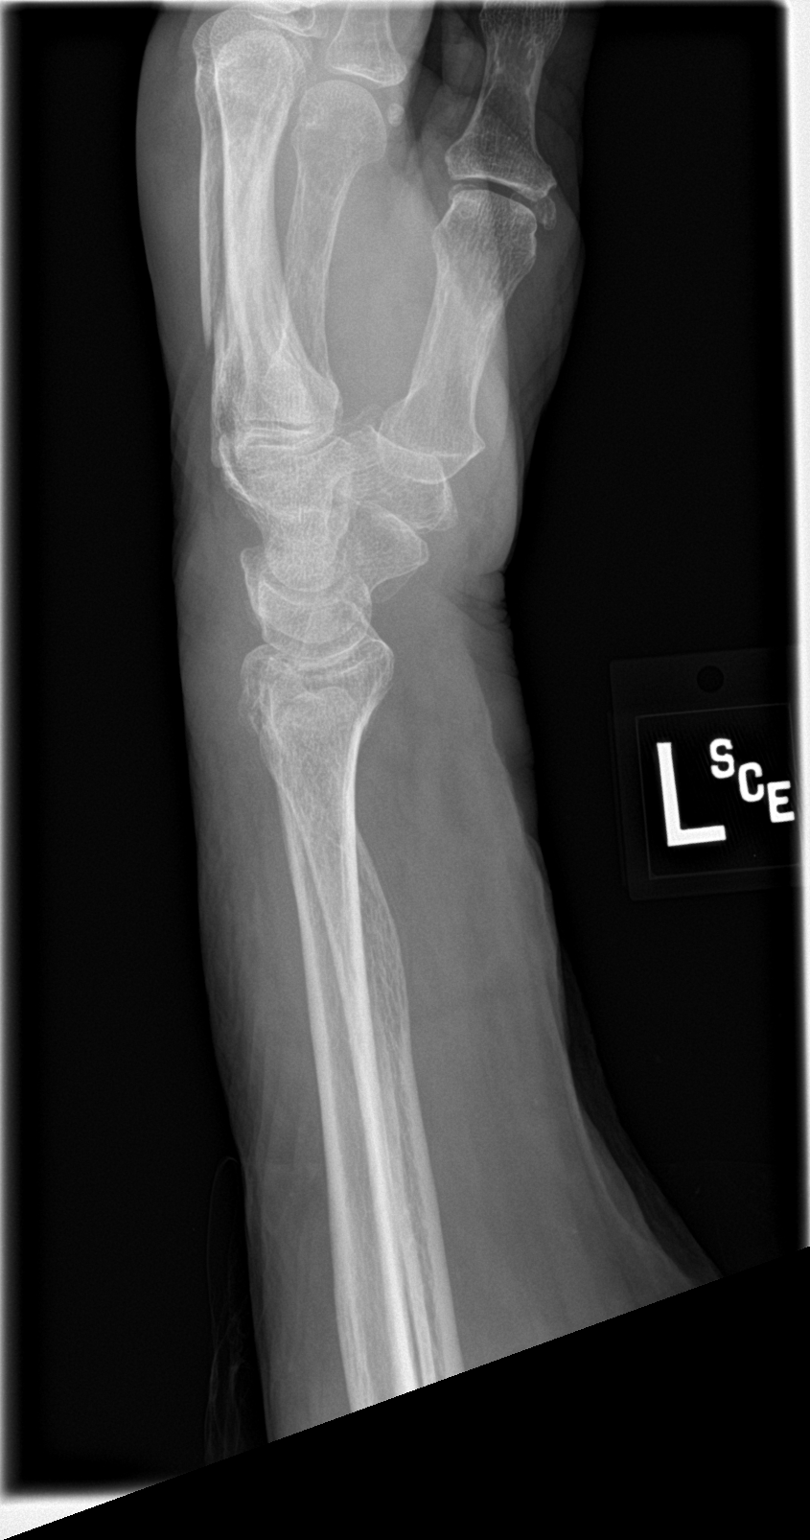

[wrist obl]
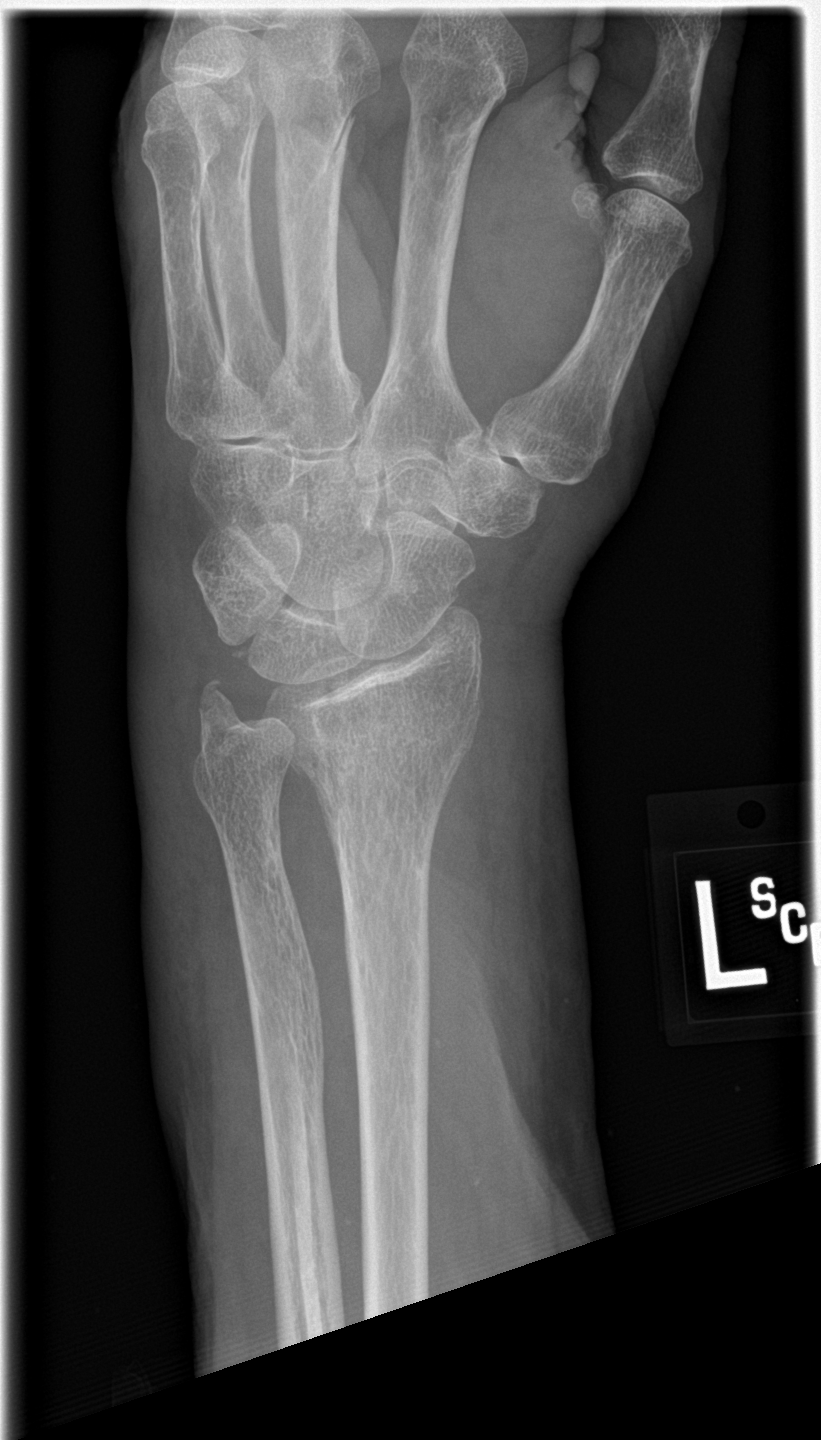

[wrist lat]
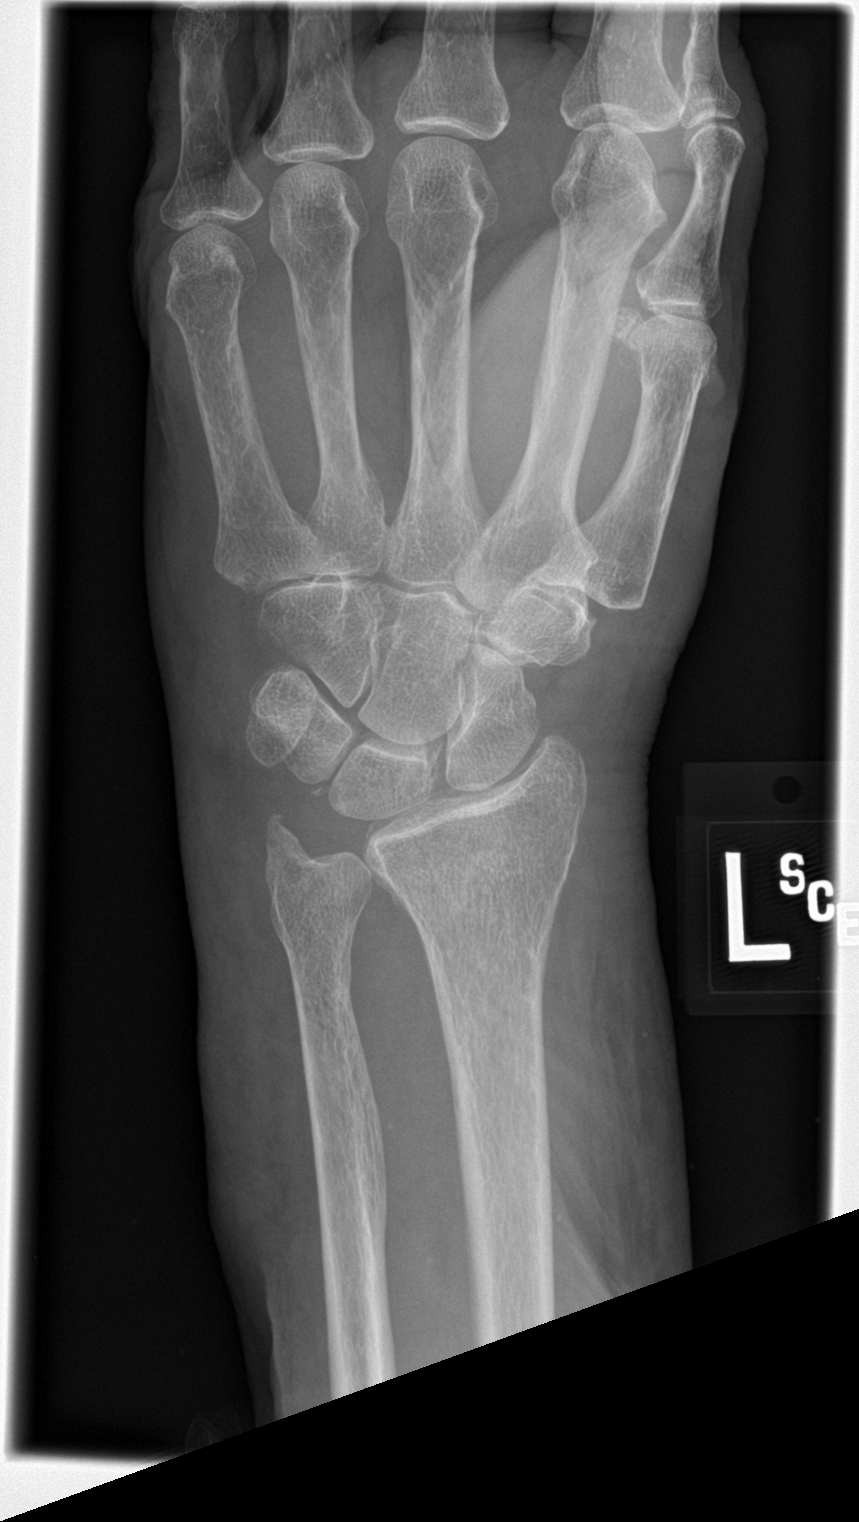

[wrist ap (2 of 2)]
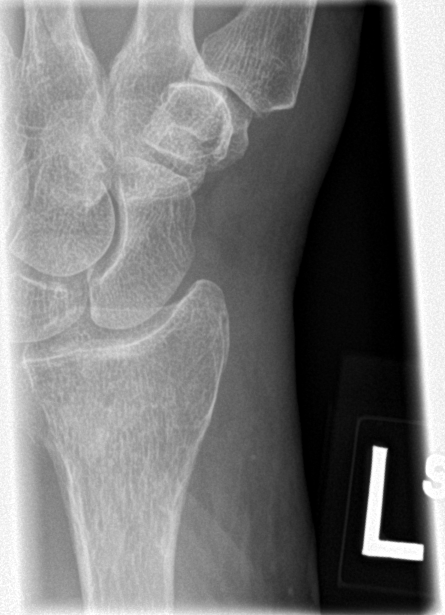

[4 of 4 positions shown; findings below may reference images not displayed]

FINDINGS: A nondisplaced transverse fracture of the distal radial metaphysis
is noted. There is no definite articular extension. An ulnar styloid
fracture is minimally displaced. A minimally displaced oblique
fracture is present in the third metacarpal. No additional fractures
are present. Soft tissue swelling is present over the dorsum of the
hand and wrist.
IMPRESSION: 1. Nondisplaced transverse fracture of the distal radius.
2. Minimally displaced ulnar styloid fracture.
3. Minimally displaced oblique fracture in the third metacarpal.
4. Extensive soft tissue swelling over the dorsum of the hand and
wrist.

## 2019-06-14 ENCOUNTER — Emergency Department: Payer: Medicare Other

## 2019-06-14 ENCOUNTER — Emergency Department
Admission: EM | Admit: 2019-06-14 | Discharge: 2019-06-14 | Disposition: A | Discharge status: Home / Self Care | Payer: Medicare Other | Attending: Student | Admitting: Student

## 2019-06-14 ENCOUNTER — Encounter: Payer: Self-pay | Admitting: Emergency Medicine

## 2019-06-14 ENCOUNTER — Other Ambulatory Visit: Payer: Self-pay

## 2019-06-14 DIAGNOSIS — Z7982 Long term (current) use of aspirin: Secondary | ICD-10-CM | POA: Insufficient documentation

## 2019-06-14 DIAGNOSIS — R531 Weakness: Secondary | ICD-10-CM | POA: Insufficient documentation

## 2019-06-14 DIAGNOSIS — J449 Chronic obstructive pulmonary disease, unspecified: Secondary | ICD-10-CM | POA: Insufficient documentation

## 2019-06-14 DIAGNOSIS — Z79899 Other long term (current) drug therapy: Secondary | ICD-10-CM | POA: Insufficient documentation

## 2019-06-14 DIAGNOSIS — I252 Old myocardial infarction: Secondary | ICD-10-CM | POA: Insufficient documentation

## 2019-06-14 DIAGNOSIS — Z853 Personal history of malignant neoplasm of breast: Secondary | ICD-10-CM | POA: Insufficient documentation

## 2019-06-14 DIAGNOSIS — Z8673 Personal history of transient ischemic attack (TIA), and cerebral infarction without residual deficits: Secondary | ICD-10-CM | POA: Insufficient documentation

## 2019-06-14 DIAGNOSIS — I10 Essential (primary) hypertension: Secondary | ICD-10-CM | POA: Insufficient documentation

## 2019-06-14 DIAGNOSIS — Z87891 Personal history of nicotine dependence: Secondary | ICD-10-CM | POA: Diagnosis not present

## 2019-06-14 DIAGNOSIS — R42 Dizziness and giddiness: Secondary | ICD-10-CM | POA: Insufficient documentation

## 2019-06-14 LAB — BASIC METABOLIC PANEL
Anion gap: 6 (ref 5–15)
BUN: 12 mg/dL (ref 8–23)
CO2: 28 mmol/L (ref 22–32)
Calcium: 8.7 mg/dL — ABNORMAL LOW (ref 8.9–10.3)
Chloride: 104 mmol/L (ref 98–111)
Creatinine, Ser: 0.61 mg/dL (ref 0.44–1.00)
GFR calc Af Amer: >60 mL/min (ref >60)
GFR calc non Af Amer: >60 mL/min (ref >60)
Glucose, Bld: 113 mg/dL — ABNORMAL HIGH (ref 70–99)
Potassium: 4.5 mmol/L (ref 3.5–5.1)
Sodium: 138 mmol/L (ref 135–145)

## 2019-06-14 LAB — CBC
HCT: 43.1 % (ref 36.0–46.0)
Hemoglobin: 14.2 g/dL (ref 12.0–15.0)
MCH: 31.2 pg (ref 26.0–34.0)
MCHC: 32.9 g/dL (ref 30.0–36.0)
MCV: 94.7 fL (ref 80.0–100.0)
Platelets: 567 10*3/uL — ABNORMAL HIGH (ref 150–400)
RBC: 4.55 MIL/uL (ref 3.87–5.11)
RDW: 15.3 % (ref 11.5–15.5)
WBC: 7.3 10*3/uL (ref 4.0–10.5)
nRBC: 0 % (ref 0.0–0.2)

## 2019-06-14 LAB — URINALYSIS, COMPLETE (UACMP) WITH MICROSCOPIC
Bacteria, UA: NONE SEEN
Bilirubin Urine: NEGATIVE
Glucose, UA: NEGATIVE mg/dL
Ketones, ur: NEGATIVE mg/dL
Leukocytes,Ua: NEGATIVE
Nitrite: NEGATIVE
Protein, ur: NEGATIVE mg/dL
Specific Gravity, Urine: 1.018 (ref 1.005–1.030)
Squamous Epithelial / LPF: NONE SEEN (ref 0–5)
pH: 6 (ref 5.0–8.0)

## 2019-06-14 LAB — TROPONIN I (HIGH SENSITIVITY)
Troponin I (High Sensitivity): 6 ng/L (ref <18)
Troponin I (High Sensitivity): 6 ng/L (ref <18)

## 2019-06-14 MED ORDER — SODIUM CHLORIDE 0.9 % IV BOLUS
250.0000 mL | Freq: Once | INTRAVENOUS | Status: AC
  Administered 2019-06-14: 250 mL via INTRAVENOUS

## 2019-06-14 NOTE — ED Provider Notes (Signed)
[facility] Emergency Department Provider Note  ____________________________________________   First MD Initiated Contact with Patient 06/14/19 1042     (approximate)  I have reviewed the triage vital signs and the nursing notes.  History  Chief Complaint Near Syncope    HPI Bianca Taylor is a 85 y.o. female with hx of HTN, HLD, CAD, CVA who presents to the emergency department for generalized weakness and lightheadedness.  According to the husband (history obtained via phone) this morning the patient was in the kitchen when she complained of feeling lightheaded and generally weak.  He went to assist her in the kitchen, and she slowly sat down because of the weakness.  He denies any fall or loss of consciousness.  No head injury or other injury related.  He states she was able to sit down without any trauma.  He confirms that at baseline she has some confusion/dementia.  He states there has been no recent illnesses - no fever, cough, trouble breathing, nausea, vomiting, diarrhea, urinary symptoms.   On her arrival to the emergency department, she has no acute complaints.  She denies any pain, lightheadedness, dizziness, or otherwise.  Caveat: History primarily obtained from EMS and husband, due to patient's history of dementia/confusion.   Past Medical Hx Past Medical History:  Diagnosis Date  . Breast cancer ([place]) 2013   left breast  . COPD (chronic obstructive pulmonary disease) ([place])   . GERD (gastroesophageal reflux disease)   . Hypercholesterolemia   . Hypertension   . Ischemic cardiomyopathy    a. 01/2017 Echo: EF 45-50%, mod inflat and inf HK, Gr1 DD.  Marland Kitchen ST elevation myocardial infarction (STEMI) of inferior wall ([place])    a. 01/2017 in setting of acute stroke-->medically managed.  . Stroke ([place])    a. 01/2017 MRI: 1 cm acute ischemic nonhemorrhagic lacunar type infarct involving the left lentiform nucleus. Mod cerebral atrophy, chronic  microvascular isch dzs, multiple scattered remote infarcts, extensive chrnoic micro hemorrhages;  b. 01/2016 CTA head/neck: no acute IC/EC stenosis or dissection.    Problem List Patient Active Problem List   Diagnosis Date Noted  . Cough 11/06/2018  . PAC (premature atrial contraction) 11/06/2018  . Ischemic cardiomyopathy 11/06/2018  . Fibromyalgia 06/25/2017  . Essential hypertension 06/25/2017  . GERD (gastroesophageal reflux disease) 06/25/2017  . History of stroke 06/10/2017  . Hyperlipidemia LDL goal <70 06/10/2017  . Coronary artery disease involving native coronary artery of native heart without angina pectoris 03/11/2017    Past Surgical Hx Past Surgical History:  Procedure Laterality Date  . BRAIN SURGERY    . BREAST BIOPSY Left    positive 03/2012  . BREAST BIOPSY Bilateral    negative 2000  . BREAST EXCISIONAL BIOPSY Left    positive 04/2012  . BREAST LUMPECTOMY Left 2013  . BREAST MAMMOSITE Left   . CATARACT EXTRACTION    . ENTROPIAN REPAIR Right 04/27/2018   Procedure: REPAIR OF ENTROPION, SUTURES AND EXTENSIVE;  Surgeon: Karle Starch, MD;  Location: [facility];  Service: Ophthalmology;  Laterality: Right;    Medications Prior to Admission medications   Medication Sig Start Date End Date Taking? Authorizing Provider  acetaminophen (TYLENOL) 325 MG tablet Take 2 tablets (650 mg total) by mouth every 6 (six) hours as needed for mild pain (or Fever >/= 101). 02/20/17   Nicholes Mango, MD  aspirin EC 81 MG tablet Take 81 mg by mouth at bedtime.     [provider]  atorvastatin (LIPITOR) 80 MG tablet Take 1 tablet (80 mg total) by mouth daily. 11/05/18 02/03/19  End, Harrell Gave, MD  butalbital-acetaminophen-caffeine (FIORICET, ESGIC) [phone] MG tablet Take 1 tablet by mouth every 4 (four) hours as needed for headache.     [provider]  erythromycin ophthalmic ointment Apply to sutures 4 times a day for 10-12 days.  Discontinue if allergy  develops and call our office 04/27/18   Karle Starch, MD  metoprolol tartrate (LOPRESSOR) 25 MG tablet Take 1 tablet (25 mg total) by mouth 2 (two) times daily. 12/14/18   End, Harrell Gave, MD  nitroGLYCERIN (NITROSTAT) 0.4 MG SL tablet Place 1 tablet (0.4 mg total) under the tongue every 5 (five) minutes as needed for chest pain. Maximum of 3 doses. 03/10/17 04/15/18  Theora Gianotti, NP  pantoprazole (PROTONIX) 40 MG tablet Take 40 mg daily by mouth.    [provider]  telmisartan (MICARDIS) 80 MG tablet Take 40 mg by mouth at bedtime.    [provider]  zolpidem (AMBIEN) 5 MG tablet Take 5 mg by mouth at bedtime as needed for sleep.    [provider]    Allergies Codeine, Fluocinolone, Moxifloxacin, Pravastatin, Risedronate, Tetracyclines & related, and Niacin  Family Hx Family History  Problem Relation Age of Onset  . Breast cancer Sister 43    Social Hx Social History   Tobacco Use  . Smoking status: Former Smoker    Quit date: 1990    Years since quitting: 30.7  . Smokeless tobacco: Never Used  Substance Use Topics  . Alcohol use: No  . Drug use: Never     Review of Systems  Constitutional: Negative for fever, chills. Eyes: Negative for visual changes. ENT: Negative for sore throat. Cardiovascular: Negative for chest pain. Respiratory: Negative for shortness of breath. Gastrointestinal: Negative for nausea, vomiting.  Genitourinary: Negative for dysuria. Musculoskeletal: Negative for leg swelling. Skin: Negative for rash. Neurological: Negative for for headaches.   Physical Exam  Vital Signs: ED Triage Vitals  Enc Vitals Group     BP 06/14/19 1038 136/79     Pulse Rate 06/14/19 1038 72     Resp 06/14/19 1038 (!) 22     Temp 06/14/19 1038 97.7 F (36.5 C)     Temp Source 06/14/19 1038 Oral     SpO2 06/14/19 1038 97 %     Weight 06/14/19 1037 101 lb 6.6 oz (46 kg)     Height 06/14/19 1037 5\' 1"  (1.549 m)     Head  Circumference --      Peak Flow --      Pain Score --      Pain Loc --      Pain Edu? --      Excl. in [place]? --     Constitutional: Awake and alert. Pleasant. In no acute distress. Head: Normocephalic. Atraumatic. Eyes: Conjunctivae clear. Sclera anicteric. Nose: No congestion. No rhinorrhea. Mouth/Throat: Mucous membranes are slightly dry.  Neck: No stridor.   Cardiovascular: Normal rate, regular rhythm. Extremities well perfused. Respiratory: Normal respiratory effort.  Lungs CTAB. Gastrointestinal: Soft. Non-tender. Non-distended.  Musculoskeletal: No lower extremity edema. No deformities. Neurologic:  Normal speech and language. No gross focal neurologic deficits are appreciated.  Skin: Skin is warm, dry and intact. No rash noted. Psychiatric: Mood and affect are appropriate for situation.  EKG  Personally reviewed.   Rate: 66 Rhythm: sinus Axis: normal Intervals: WNL Minimal ST/T changes in II, III aVF -  these are present on prior and appear unchanged No STEMI    Radiology  CT head: IMPRESSION: 1. No acute intracranial abnormality or significant interval change. 2. Stable appearance of atrophy and diffuse white matter disease. This likely reflects the sequela of chronic microvascular ischemia. 3. Remote lacunar infarcts of the thalami, right greater than left. 4. Postsurgical changes and encephalomalacia involving the anterior left frontal lobe. 5. Atherosclerosis     Procedures  Procedure(s) performed (including critical care):  Procedures   Initial Impression / Assessment and Plan / ED Course  85 y.o. female who presents to the ED for generalized weakness, lightheadedness  Ddx: electrolyte derangements, anemia, UTI, arrhythmia, amongst others  Plan: labs, EKG, imaging, reassess.    EKG with chronic ST/T wave changes, appears unchanged from her prior, no STEMI. No evidence of arrhythmia. High-sensitivity troponin negative.   Labs without actionable  derangements, no evidence of UTI.  CT head negative.  Patient has tolerated PO while in the emergency department, and is otherwise feeling well.  As such, given her negative work-up, will plan for discharge.  Advised PCP follow-up, given return precautions.   Final Clinical Impression(s) / ED Diagnosis  Final diagnoses:  Weakness       Note:  This document was prepared using Dragon voice recognition software and may include unintentional dictation errors.   Lilia Pro., MD 06/14/19 1540

## 2019-06-14 NOTE — ED Notes (Signed)
Pt unable to sign due to hx of dementia, talk to pt's husband about discharge instructions. Husband verbalized understanding

## 2019-06-14 NOTE — ED Notes (Signed)
Spoke the pt's daughter Kindal Ohagan [phone] with permission from pt. Will continue to update her.

## 2019-06-14 NOTE — ED Triage Notes (Signed)
Patient from home via [facility]. Patient had near syncopal episode this morning. Was able to sit down on floor without falling. Patient has some confusion at baseline per family. Patient complaining of pain in left knee, but unsure if it is new or old.

## 2019-06-14 NOTE — Discharge Instructions (Signed)
Thank you for letting us take care of you in the emergency department today.  ° °Please continue to take any regular, prescribed medications.  ° °Please follow up with: °- Your primary care doctor to review your ER visit and follow up on your symptoms.  ° °Please return to the ER for any new or worsening symptoms.  ° °

## 2019-06-14 NOTE — ED Notes (Addendum)
Updated pt's husband, states he is on the way to pick her up in approx 15 mins.

## 2019-06-15 LAB — URINE CULTURE: Culture: NO GROWTH

## 2019-06-16 ENCOUNTER — Ambulatory Visit
Admission: RE | Admit: 2019-06-16 | Discharge: 2019-06-16 | Disposition: A | Discharge status: Home / Self Care | Payer: Medicare Other | Source: Ambulatory Visit | Attending: Internal Medicine | Admitting: Internal Medicine

## 2019-06-16 ENCOUNTER — Other Ambulatory Visit: Payer: Self-pay

## 2019-06-16 ENCOUNTER — Other Ambulatory Visit: Payer: Self-pay | Admitting: Internal Medicine

## 2019-06-16 DIAGNOSIS — R1012 Left upper quadrant pain: Secondary | ICD-10-CM

## 2019-06-16 DIAGNOSIS — R3129 Other microscopic hematuria: Secondary | ICD-10-CM

## 2019-07-11 ENCOUNTER — Other Ambulatory Visit: Payer: Self-pay | Admitting: Internal Medicine

## 2019-07-11 DIAGNOSIS — Z1231 Encounter for screening mammogram for malignant neoplasm of breast: Secondary | ICD-10-CM

## 2019-10-20 ENCOUNTER — Ambulatory Visit: Payer: Medicare Other | Admitting: Internal Medicine

## 2019-10-26 ENCOUNTER — Encounter: Payer: Self-pay | Admitting: Internal Medicine

## 2019-10-26 ENCOUNTER — Other Ambulatory Visit: Payer: Self-pay

## 2019-10-26 ENCOUNTER — Ambulatory Visit (INDEPENDENT_AMBULATORY_CARE_PROVIDER_SITE_OTHER): Payer: Medicare Other | Admitting: Internal Medicine

## 2019-10-26 VITALS — BP 130/66 | HR 64 | Ht 62.0 in | Wt 104.0 lb

## 2019-10-26 DIAGNOSIS — I471 Supraventricular tachycardia: Secondary | ICD-10-CM

## 2019-10-26 DIAGNOSIS — I2510 Atherosclerotic heart disease of native coronary artery without angina pectoris: Secondary | ICD-10-CM | POA: Diagnosis not present

## 2019-10-26 DIAGNOSIS — I10 Essential (primary) hypertension: Secondary | ICD-10-CM | POA: Diagnosis not present

## 2019-10-26 DIAGNOSIS — Z8673 Personal history of transient ischemic attack (TIA), and cerebral infarction without residual deficits: Secondary | ICD-10-CM

## 2019-10-26 DIAGNOSIS — I255 Ischemic cardiomyopathy: Secondary | ICD-10-CM

## 2019-10-26 NOTE — Patient Instructions (Signed)
Medication Instructions:  Your physician recommends that you continue on your current medications as directed. Please refer to the Current Medication list given to you today.  *If you need a refill on your cardiac medications before your next appointment, please call your pharmacy*  Lab Work: none If you have labs (blood work) drawn today and your tests are completely normal, you will receive your results only by: Marland Kitchen MyChart Message (if you have MyChart) OR . A paper copy in the mail If you have any lab test that is abnormal or we need to change your treatment, we will call you to review the results.  Testing/Procedures: none  Follow-Up: At [facility], you and your health needs are our priority.  As part of our continuing mission to provide you with exceptional heart care, we have created designated Provider Care Teams.  These Care Teams include your primary Cardiologist (physician) and Advanced Practice Providers (APPs -  Physician Assistants and Nurse Practitioners) who all work together to provide you with the care you need, when you need it.  Your next appointment:   12 month(s)  The format for your next appointment:   In Person  Provider:    You may see DR Harrell Gave END or one of the following Advanced Practice Providers on your designated Care Team:    Murray Hodgkins, NP  Christell Faith, PA-C  Marrianne Mood, PA-C

## 2019-10-26 NOTE — Progress Notes (Signed)
Follow-up Outpatient Visit Date: 10/26/2019  Primary Care Provider: [name], MD [place] [address]  Chief Complaint: Follow-up coronary artery disease and PSVT  HPI:  Bianca Taylor is a 86 y.o. female with history of coronary artery disease with inferior STEMI in the setting of acute stroke in5/2018 (MI managed medically), hypertension, hyperlipidemia, breast cancer, COPD, and GERD, who presents for follow-up of coronary artery disease.  I last saw Bianca Taylor in 10/2018, at which time she was feeling well other than chronic cough.  We agreed to obtain a 48-hour Holter monitor to ensure that cough was not related to underlying arrhythmia, given that PVCs were previously noted on EKG.  We also obtain an echocardiogram to reassess her EF.  Monitor showed multiple runs of PSVT as well as questionable NSVT versus SVT with a aberrancy.  We agreed to increase metoprolol tartrate to 25 mg twice daily.  Echo showed LVEF of 50 to 55% with grade 1 diastolic dysfunction and normal RV size and function.  Aortic sclerosis without stenosis was also noted.  Today, Bianca Taylor reports that she is feeling well.  The cough that plagued her at our last visit has resolved.  She has not had any significant palpitations nor chest pain, shortness of breath, or lightheadedness.  She experienced some dependent edema approximately 6 months ago, though this resolved with elevation of her legs.  She has not had any new focal neurologic deficits.  --------------------------------------------------------------------------------------------------  Past Medical History:  Diagnosis Date  . Breast cancer ([place]) 2013   left breast  . COPD (chronic obstructive pulmonary disease) ([place])   . GERD (gastroesophageal reflux disease)   . Hypercholesterolemia   . Hypertension   . Ischemic cardiomyopathy    a. 01/2017 Echo: EF 45-50%, mod inflat and inf HK, Gr1 DD.  Marland Kitchen ST elevation  myocardial infarction (STEMI) of inferior wall ([place])    a. 01/2017 in setting of acute stroke-->medically managed.  . Stroke ([place])    a. 01/2017 MRI: 1 cm acute ischemic nonhemorrhagic lacunar type infarct involving the left lentiform nucleus. Mod cerebral atrophy, chronic microvascular isch dzs, multiple scattered remote infarcts, extensive chrnoic micro hemorrhages;  b. 01/2016 CTA head/neck: no acute IC/EC stenosis or dissection.   Past Surgical History:  Procedure Laterality Date  . BRAIN SURGERY    . BREAST BIOPSY Left    positive 03/2012  . BREAST BIOPSY Bilateral    negative 2000  . BREAST EXCISIONAL BIOPSY Left    positive 04/2012  . BREAST LUMPECTOMY Left 2013  . BREAST MAMMOSITE Left   . CATARACT EXTRACTION    . ENTROPIAN REPAIR Right 04/27/2018   Procedure: REPAIR OF ENTROPION, SUTURES AND EXTENSIVE;  Surgeon: Karle Starch, MD;  Location: [facility];  Service: Ophthalmology;  Laterality: Right;    Current Meds  Medication Sig  . acetaminophen (TYLENOL) 325 MG tablet Take 2 tablets (650 mg total) by mouth every 6 (six) hours as needed for mild pain (or Fever >/= 101).  Marland Kitchen aspirin EC 81 MG tablet Take 81 mg by mouth at bedtime.   Marland Kitchen atorvastatin (LIPITOR) 80 MG tablet Take 1 tablet (80 mg total) by mouth daily.  . butalbital-acetaminophen-caffeine (FIORICET, ESGIC) 50-325-40 MG tablet Take 1 tablet by mouth every 4 (four) hours as needed for headache.   . erythromycin ophthalmic ointment Apply to sutures 4 times a day for 10-12 days.  Discontinue if allergy develops and call our office  .  latanoprost (XALATAN) 0.005 % ophthalmic solution Place 1 drop into both eyes at bedtime.  . metoprolol tartrate (LOPRESSOR) 25 MG tablet Take 1 tablet (25 mg total) by mouth 2 (two) times daily.  . nitroGLYCERIN (NITROSTAT) 0.4 MG SL tablet Place 1 tablet (0.4 mg total) under the tongue every 5 (five) minutes as needed for chest pain. Maximum of 3 doses.  . pantoprazole (PROTONIX) 40 MG  tablet Take 40 mg daily by mouth.  . telmisartan (MICARDIS) 80 MG tablet Take 80 mg by mouth daily.    Allergies: Codeine, Fluocinolone, Moxifloxacin, Pravastatin, Risedronate, Tetracyclines & related, and Niacin  Social History   Tobacco Use  . Smoking status: Former Smoker    Quit date: 1990    Years since quitting: 31.1  . Smokeless tobacco: Never Used  Substance Use Topics  . Alcohol use: No  . Drug use: Never    Family History  Problem Relation Age of Onset  . Breast cancer Sister 70    Review of Systems: A 12-system review of systems was performed and was negative except as noted in the HPI.  --------------------------------------------------------------------------------------------------  Physical Exam: BP 130/66 (BP Location: Right Arm, Patient Position: Sitting, Cuff Size: Normal)   Pulse 64   Ht 5\' 2"  (1.575 m)   Wt 104 lb (47.2 kg)   SpO2 96%   BMI 19.02 kg/m   General: NAD.  Accompanied by her husband. HEENT: No conjunctival pallor or scleral icterus. Facemask in place. Neck: No JVD or HJR. Lungs: Normal work of breathing. Clear to auscultation bilaterally without wheezes or crackles. Heart: Regular rate and rhythm without murmurs, rubs, or gallops. Abd: Bowel sounds present. Soft, NT/ND without hepatosplenomegaly Ext: No lower extremity edema.  2+ radial pulses bilaterally.  EKG: Normal sinus rhythm with low voltage, septal Q waves, and nonspecific ST changes in the inferior leads.  No significant change from prior tracings.  Lab Results  Component Value Date   WBC 7.3 06/14/2019   HGB 14.2 06/14/2019   HCT 43.1 06/14/2019   MCV 94.7 06/14/2019   PLT 567 (H) 06/14/2019    Lab Results  Component Value Date   NA 138 06/14/2019   K 4.5 06/14/2019   CL 104 06/14/2019   CO2 28 06/14/2019   BUN 12 06/14/2019   CREATININE 0.61 06/14/2019   GLUCOSE 113 (H) 06/14/2019   ALT 23 02/18/2017    Lab Results  Component Value Date   CHOL 171  02/19/2017   HDL 49 02/19/2017   LDLCALC 99 02/19/2017   TRIG 114 02/19/2017   CHOLHDL 3.5 02/19/2017    --------------------------------------------------------------------------------------------------  ASSESSMENT AND PLAN: Coronary artery disease: No symptoms to suggest worsening coronary insufficiency.  EKG today again shows nonspecific ST changes in the inferior leads that are stable from multiple prior EKGs.  We have agreed to continue with current regimen for secondary prevention.  Ischemic cardiomyopathy: Bianca Taylor is euvolemic without symptoms of heart failure.  Echo in 11/2018 showed low normal LVEF.  Continue current doses of metoprolol and telmisartan.  History of stroke: No new neurologic deficits.  Continue aspirin and statin therapy as well as blood pressure control.  Hypertension: Blood pressure upper normal today.  Continue current doses of metoprolol and telmisartan.  PSVT: Asymptomatic with a EKG today demonstrating sinus rhythm with heart rate of 64 bpm.  Continue metoprolol tartrate 25 mg twice daily.  Follow-up: Return to clinic in 1 year.  Nelva Bush, MD 10/28/2019 9:28 AM

## 2019-10-28 ENCOUNTER — Encounter: Payer: Self-pay | Admitting: Internal Medicine

## 2019-12-12 ENCOUNTER — Other Ambulatory Visit: Payer: Self-pay | Admitting: Internal Medicine

## 2019-12-15 ENCOUNTER — Ambulatory Visit
Admission: RE | Admit: 2019-12-15 | Discharge: 2019-12-15 | Disposition: A | Discharge status: Home / Self Care | Payer: Medicare Other | Source: Ambulatory Visit | Attending: Internal Medicine | Admitting: Internal Medicine

## 2019-12-15 DIAGNOSIS — Z1231 Encounter for screening mammogram for malignant neoplasm of breast: Secondary | ICD-10-CM | POA: Diagnosis present

## 2020-01-23 ENCOUNTER — Other Ambulatory Visit: Payer: Self-pay | Admitting: Internal Medicine

## 2020-03-30 ENCOUNTER — Encounter: Payer: Self-pay | Admitting: Emergency Medicine

## 2020-03-30 ENCOUNTER — Emergency Department: Payer: Medicare Other

## 2020-03-30 ENCOUNTER — Other Ambulatory Visit: Payer: Self-pay

## 2020-03-30 ENCOUNTER — Emergency Department
Admission: EM | Admit: 2020-03-30 | Discharge: 2020-03-30 | Disposition: A | Discharge status: Home / Self Care | Payer: Medicare Other | Attending: Emergency Medicine | Admitting: Emergency Medicine

## 2020-03-30 DIAGNOSIS — I10 Essential (primary) hypertension: Secondary | ICD-10-CM | POA: Insufficient documentation

## 2020-03-30 DIAGNOSIS — M11262 Other chondrocalcinosis, left knee: Secondary | ICD-10-CM | POA: Diagnosis not present

## 2020-03-30 DIAGNOSIS — Z79899 Other long term (current) drug therapy: Secondary | ICD-10-CM | POA: Insufficient documentation

## 2020-03-30 DIAGNOSIS — Z853 Personal history of malignant neoplasm of breast: Secondary | ICD-10-CM | POA: Insufficient documentation

## 2020-03-30 DIAGNOSIS — M1712 Unilateral primary osteoarthritis, left knee: Secondary | ICD-10-CM | POA: Insufficient documentation

## 2020-03-30 DIAGNOSIS — J449 Chronic obstructive pulmonary disease, unspecified: Secondary | ICD-10-CM | POA: Diagnosis not present

## 2020-03-30 DIAGNOSIS — I2510 Atherosclerotic heart disease of native coronary artery without angina pectoris: Secondary | ICD-10-CM | POA: Diagnosis not present

## 2020-03-30 DIAGNOSIS — Z87891 Personal history of nicotine dependence: Secondary | ICD-10-CM | POA: Diagnosis not present

## 2020-03-30 DIAGNOSIS — M25462 Effusion, left knee: Secondary | ICD-10-CM

## 2020-03-30 DIAGNOSIS — Z7982 Long term (current) use of aspirin: Secondary | ICD-10-CM | POA: Insufficient documentation

## 2020-03-30 DIAGNOSIS — M1120 Other chondrocalcinosis, unspecified site: Secondary | ICD-10-CM

## 2020-03-30 MED ORDER — LIDOCAINE HCL (PF) 1 % IJ SOLN
5.0000 mL | Freq: Once | INTRAMUSCULAR | Status: DC
  Filled 2020-03-30: qty 5

## 2020-03-30 MED ORDER — TRIAMCINOLONE ACETONIDE 40 MG/ML IJ SUSP
40.0000 mg | Freq: Once | INTRAMUSCULAR | Status: DC
  Filled 2020-03-30: qty 1

## 2020-03-30 NOTE — ED Provider Notes (Signed)
[facility] EMERGENCY DEPARTMENT Provider Note   CSN: [id number] Arrival date & time: 03/30/20  1641     History Chief Complaint  Patient presents with  . Joint Swelling    Bianca Taylor is a 86 y.o. female presents to the emergency department for evaluation of left knee pain.  She has had left knee pain and swelling for 1 week.  She lives at home, pain and swelling have caused her to have decreased mobility.  She is currently able to walk with a walker.  She denies any recent trauma or injury.  No calf pain or swelling.  No groin or thigh pain.  No numbness tingling radicular symptoms.  No fevers warmth or redness.  She describes her pain throughout the left knee in the suprapatellar region anteriorly.  Pain is mild while resting in the bed but with standing and weightbearing pain is moderate to severe  HPI     Past Medical History:  Diagnosis Date  . Breast cancer ([place]) 2013   left breast  . COPD (chronic obstructive pulmonary disease) ([facility])   . GERD (gastroesophageal reflux disease)   . Hypercholesterolemia   . Hypertension   . Ischemic cardiomyopathy    a. 01/2017 Echo: EF 45-50%, mod inflat and inf HK, Gr1 DD.  Marland Kitchen ST elevation myocardial infarction (STEMI) of inferior wall ([place])    a. 01/2017 in setting of acute stroke-->medically managed.  . Stroke ([place])    a. 01/2017 MRI: 1 cm acute ischemic nonhemorrhagic lacunar type infarct involving the left lentiform nucleus. Mod cerebral atrophy, chronic microvascular isch dzs, multiple scattered remote infarcts, extensive chrnoic micro hemorrhages;  b. 01/2016 CTA head/neck: no acute IC/EC stenosis or dissection.    Patient Active Problem List   Diagnosis Date Noted  . Cough 11/06/2018  . PAC (premature atrial contraction) 11/06/2018  . Ischemic cardiomyopathy 11/06/2018  . Fibromyalgia 06/25/2017  . Essential hypertension 06/25/2017  . GERD (gastroesophageal reflux disease) 06/25/2017  . History of stroke  06/10/2017  . Hyperlipidemia LDL goal <70 06/10/2017  . Coronary artery disease involving native coronary artery of native heart without angina pectoris 03/11/2017    Past Surgical History:  Procedure Laterality Date  . BRAIN SURGERY    . BREAST BIOPSY Left    positive 03/2012  . BREAST BIOPSY Bilateral    negative 2000  . BREAST EXCISIONAL BIOPSY Left    positive 04/2012  . BREAST LUMPECTOMY Left 2013  . BREAST MAMMOSITE Left   . CATARACT EXTRACTION    . ENTROPIAN REPAIR Right 04/27/2018   Procedure: REPAIR OF ENTROPION, SUTURES AND EXTENSIVE;  Surgeon: Karle Starch, MD;  Location: [facility];  Service: Ophthalmology;  Laterality: Right;     OB History   No obstetric history on file.     Family History  Problem Relation Age of Onset  . Breast cancer Sister 58    Social History   Tobacco Use  . Smoking status: Former Smoker    Quit date: 1990    Years since quitting: 31.5  . Smokeless tobacco: Never Used  Vaping Use  . Vaping Use: Never used  Substance Use Topics  . Alcohol use: No  . Drug use: Never    Home Medications Prior to Admission medications   Medication Sig Start Date End Date Taking? Authorizing Provider  acetaminophen (TYLENOL) 325 MG tablet Take 2 tablets (650 mg total) by mouth every 6 (six) hours as needed for mild pain (or Fever >/= 101).  02/20/17   Nicholes Mango, MD  aspirin EC 81 MG tablet Take 81 mg by mouth at bedtime.     [provider]  atorvastatin (LIPITOR) 80 MG tablet TAKE ONE TABLET BY MOUTH EVERY DAY 12/12/19   End, Harrell Gave, MD  butalbital-acetaminophen-caffeine (FIORICET, ESGIC) 50-325-40 MG tablet Take 1 tablet by mouth every 4 (four) hours as needed for headache.     [provider]  erythromycin ophthalmic ointment Apply to sutures 4 times a day for 10-12 days.  Discontinue if allergy develops and call our office 04/27/18   Karle Starch, MD  latanoprost (XALATAN) 0.005 % ophthalmic solution Place 1 drop into  both eyes at bedtime. 04/05/19   [provider]  metoprolol tartrate (LOPRESSOR) 25 MG tablet TAKE ONE TABLET BY MOUTH TWICE DAILY 01/23/20   End, Harrell Gave, MD  nitroGLYCERIN (NITROSTAT) 0.4 MG SL tablet Place 1 tablet (0.4 mg total) under the tongue every 5 (five) minutes as needed for chest pain. Maximum of 3 doses. 03/10/17 10/26/19  Theora Gianotti, NP  pantoprazole (PROTONIX) 40 MG tablet Take 40 mg daily by mouth.    [provider]  telmisartan (MICARDIS) 80 MG tablet Take 80 mg by mouth daily. 04/11/19   [provider]    Allergies    Codeine, Fluocinolone, Moxifloxacin, Pravastatin, Risedronate, Tetracyclines & related, and Niacin  Review of Systems   Review of Systems  Constitutional: Negative for chills and fever.  Respiratory: Negative for shortness of breath.   Cardiovascular: Negative for chest pain and leg swelling.  Gastrointestinal: Negative for diarrhea, nausea and vomiting.  Musculoskeletal: Positive for arthralgias, gait problem and joint swelling. Negative for neck pain and neck stiffness.  Skin: Negative for rash and wound.  Neurological: Negative for dizziness, light-headedness and headaches.    Physical Exam Updated Vital Signs BP (!) 147/81 (BP Location: Right Arm)   Pulse 68   Temp 97.6 F (36.4 C) (Oral)   Resp 16   Ht 5\' 2"  (1.575 m)   Wt 47.2 kg   SpO2 96%   BMI 19.03 kg/m   Physical Exam Vitals and nursing note reviewed.  Constitutional:      Appearance: She is well-developed.  HENT:     Head: Normocephalic and atraumatic.     Right Ear: External ear normal.     Left Ear: External ear normal.     Nose: Nose normal.  Eyes:     Conjunctiva/sclera: Conjunctivae normal.     Pupils: Pupils are equal, round, and reactive to light.  Cardiovascular:     Rate and Rhythm: Normal rate.  Pulmonary:     Effort: Pulmonary effort is normal. No respiratory distress.     Breath sounds: Normal breath sounds.  Abdominal:      Palpations: Abdomen is soft.     Tenderness: There is no abdominal tenderness.  Musculoskeletal:        General: Swelling and tenderness present. No deformity.     Cervical back: Normal range of motion.     Comments: Left lower extremity with normal hip range of motion, no pain with hip range of motion.  Left knee, large effusion no warmth or redness.  Tender along the medial and lateral joint line.  Point tender along the medial tibial plateau.  She is able to actively straight leg raise.  Tib-fib region with no swelling warmth erythema edema or tenderness.  No swelling or tenderness throughout the ankle.  No right lower extremity swelling warmth erythema or  tenderness.  Skin:    General: Skin is warm and dry.     Findings: No rash.  Neurological:     Mental Status: She is alert and oriented to person, place, and time.     Cranial Nerves: No cranial nerve deficit.     Coordination: Coordination normal.  Psychiatric:        Behavior: Behavior normal.        Thought Content: Thought content normal.     ED Results / Procedures / Treatments   Labs (all labs ordered are listed, but only abnormal results are displayed) Labs Reviewed - No data to display  EKG None  Radiology CT Knee Left Wo Contrast  Result Date: 03/30/2020 CLINICAL DATA:  [place], left knee effusion, decreased mobility EXAM: CT OF THE LEFT KNEE WITHOUT CONTRAST TECHNIQUE: Multidetector CT imaging of the LEFT knee was performed according to the standard protocol. Multiplanar CT image reconstructions were also generated. COMPARISON:  03/30/2020 FINDINGS: Bones/Joint/Cartilage There are no acute displaced fractures. There is moderate to severe 3 compartmental osteoarthritis greatest in the medial compartment. There is a small suprapatellar joint effusion. A small amount of gas is seen within the suprapatellar bursa, of uncertain etiology or significance. I do not see any evidence of penetrating trauma. Ligaments Suboptimally  assessed by CT. Muscles and Tendons No significant findings. Soft tissues Soft tissues are grossly unremarkable. Reconstructed images demonstrate no additional findings. IMPRESSION: 1. No acute displaced fracture. 2. 3 compartmental osteoarthritis greatest in the medial compartment. 3. Small joint effusion, with minimal gas in the suprapatellar bursa of uncertain significance or etiology. This could be related to vacuum phenomenon. No evidence of penetrating trauma. Electronically Signed   By: Randa Ngo M.D.   On: 03/30/2020 21:09   DG Knee Complete 4 Views Left  Result Date: 03/30/2020 CLINICAL DATA:  Left knee effusion.  Fall. EXAM: LEFT KNEE - COMPLETE 4+ VIEW COMPARISON:  None. FINDINGS: Degenerative changes most prominent the medial compartment with loss of joint space. Chondrocalcinosis identified in the lateral compartment. No fractures are seen. A moderate suprapatellar joint effusion is noted. Vascular calcifications are identified. IMPRESSION: 1. Moderate joint effusion. 2. No fractures. 3. Degenerative changes most prominent the medial compartment loss of joint space. 4. Chondrocalcinosis consistent with CPPD. Electronically Signed   By: Dorise Bullion III M.D   On: 03/30/2020 17:28    Procedures .Joint Aspiration/Arthrocentesis  Date/Time: 03/30/2020 9:34 PM Performed by: Duanne Guess, PA-C Authorized by: Duanne Guess, PA-C   Consent:    Consent obtained:  Verbal   Consent given by:  Patient   Alternatives discussed:  No treatment Location:    Location:  Knee   Knee:  L knee Anesthesia (see MAR for exact dosages):    Anesthesia method:  Local infiltration   Local anesthetic:  Lidocaine 1% w/o epi Procedure details:    Needle gauge:  18 G   Ultrasound guidance: no     Approach:  Lateral   Aspirate amount:  40   Aspirate characteristics:  Bloody   Steroid injected: yes     Specimen collected: no   Post-procedure details:    Dressing:  Adhesive bandage   Patient  tolerance of procedure:  Tolerated well, no immediate complications   (including critical care time)  Medications Ordered in ED Medications  lidocaine (PF) (XYLOCAINE) 1 % injection 5 mL (has no administration in time range)  triamcinolone acetonide (KENALOG-40) injection 40 mg (has no administration in time range)  lidocaine (PF) (  XYLOCAINE) 1 % injection 5 mL (has no administration in time range)    ED Course  I have reviewed the triage vital signs and the nursing notes.  Pertinent labs & imaging results that were available during my care of the patient were reviewed by me and considered in my medical decision making (see chart for details).    MDM Rules/Calculators/A&P                          85 year old female with nontraumatic left knee pain.  She does suffer from some mild dementia, per son this is her baseline.  No new trauma or injury.  She lives at home with her husband, performs minimal walking at baseline occasionally with a cane.  Over the last few days has not been able to ambulate well due to severe left knee pain.  Son noticed large knee effusion.  She comes into the ER today for evaluation.  No signs of infection.  X-rays show no evidence of definite fracture, CT confirms no acute fracture but tricompartmental osteoarthritis most severe in the medial compartment with chondrocalcinosis.  Patient underwent aspiration of the left knee joint with injection of cortisone.  She saw significant improvement in pain relief and was able to ambulate better than her baseline with a walker after the procedure.  Patient discharged home.  We will follow-up with orthopedics. Final Clinical Impression(s) / ED Diagnoses Final diagnoses:  Primary osteoarthritis of left knee  Chondrocalcinosis  Effusion of left knee joint    Rx / DC Orders ED Discharge Orders    None       Renata Caprice 03/30/20 [id number]    Blake Divine, MD 03/31/20 2000

## 2020-03-30 NOTE — Discharge Instructions (Addendum)
Please rest ice and elevate the left knee.  Use a walker as needed for ambulation.  Use Ace wrap for the next 3 days to help prevent future swelling.  Call orthopedic office to schedule follow-up appointment.  Return to the ER if recurring swelling, inability to walk, any worsening symptoms or urgent changes in health.

## 2020-03-30 NOTE — ED Notes (Signed)
Assisted pt to use bedpan, pt able to lift hip.

## 2020-03-30 NOTE — ED Triage Notes (Signed)
Arrives [facility] from home.  C/O swelling to left knee and ankle causing decreased mobility.  Patient lives at home with husband.

## 2020-08-24 ENCOUNTER — Other Ambulatory Visit: Payer: Self-pay | Admitting: Internal Medicine

## 2020-10-23 DIAGNOSIS — I82409 Acute embolism and thrombosis of unspecified deep veins of unspecified lower extremity: Secondary | ICD-10-CM

## 2020-10-23 HISTORY — DX: Acute embolism and thrombosis of unspecified deep veins of unspecified lower extremity: I82.409

## 2020-11-05 ENCOUNTER — Other Ambulatory Visit: Payer: Self-pay | Admitting: Internal Medicine

## 2020-11-05 DIAGNOSIS — Z1231 Encounter for screening mammogram for malignant neoplasm of breast: Secondary | ICD-10-CM

## 2020-11-16 ENCOUNTER — Other Ambulatory Visit: Payer: Self-pay | Admitting: Physician Assistant

## 2020-11-16 ENCOUNTER — Other Ambulatory Visit (HOSPITAL_COMMUNITY): Payer: Self-pay | Admitting: Physician Assistant

## 2020-11-16 ENCOUNTER — Other Ambulatory Visit: Payer: Self-pay

## 2020-11-16 ENCOUNTER — Ambulatory Visit
Admission: RE | Admit: 2020-11-16 | Discharge: 2020-11-16 | Disposition: A | Discharge status: Home / Self Care | Payer: Medicare Other | Source: Ambulatory Visit | Attending: Physician Assistant | Admitting: Physician Assistant

## 2020-11-16 DIAGNOSIS — R600 Localized edema: Secondary | ICD-10-CM | POA: Insufficient documentation

## 2020-12-03 ENCOUNTER — Other Ambulatory Visit: Payer: Self-pay | Admitting: Internal Medicine

## 2020-12-07 ENCOUNTER — Other Ambulatory Visit: Payer: Self-pay | Admitting: Internal Medicine

## 2020-12-07 MED ORDER — ATORVASTATIN CALCIUM 80 MG PO TABS: 80.0000 mg | ORAL_TABLET | Freq: Every day | ORAL | 0 refills | Status: DC

## 2020-12-07 NOTE — Addendum Note (Signed)
Addended by: Annia Belt on: 12/07/2020 02:31 PM   Modules accepted: Orders

## 2020-12-07 NOTE — Telephone Encounter (Signed)
[facility] calling in for refill. Looked like we initially refused the refill. Patient is scheduled with appointment in April 2022. Rx sent in.

## 2020-12-18 ENCOUNTER — Inpatient Hospital Stay: Payer: Medicare Other

## 2020-12-18 ENCOUNTER — Encounter: Payer: Self-pay | Admitting: Oncology

## 2020-12-18 ENCOUNTER — Inpatient Hospital Stay: Payer: Medicare Other | Attending: Oncology | Admitting: Oncology

## 2020-12-18 VITALS — BP 112/71 | HR 75 | Temp 97.2°F | Resp 18 | Ht 62.0 in | Wt 112.6 lb

## 2020-12-18 DIAGNOSIS — I252 Old myocardial infarction: Secondary | ICD-10-CM | POA: Diagnosis not present

## 2020-12-18 DIAGNOSIS — Z7901 Long term (current) use of anticoagulants: Secondary | ICD-10-CM | POA: Diagnosis not present

## 2020-12-18 DIAGNOSIS — J449 Chronic obstructive pulmonary disease, unspecified: Secondary | ICD-10-CM | POA: Insufficient documentation

## 2020-12-18 DIAGNOSIS — Z7982 Long term (current) use of aspirin: Secondary | ICD-10-CM | POA: Insufficient documentation

## 2020-12-18 DIAGNOSIS — Z86718 Personal history of other venous thrombosis and embolism: Secondary | ICD-10-CM | POA: Insufficient documentation

## 2020-12-18 DIAGNOSIS — I82412 Acute embolism and thrombosis of left femoral vein: Secondary | ICD-10-CM

## 2020-12-18 DIAGNOSIS — I10 Essential (primary) hypertension: Secondary | ICD-10-CM | POA: Insufficient documentation

## 2020-12-18 DIAGNOSIS — Z8673 Personal history of transient ischemic attack (TIA), and cerebral infarction without residual deficits: Secondary | ICD-10-CM | POA: Diagnosis not present

## 2020-12-18 DIAGNOSIS — Z86000 Personal history of in-situ neoplasm of breast: Secondary | ICD-10-CM | POA: Diagnosis not present

## 2020-12-18 DIAGNOSIS — Z87891 Personal history of nicotine dependence: Secondary | ICD-10-CM | POA: Diagnosis not present

## 2020-12-18 DIAGNOSIS — Z79899 Other long term (current) drug therapy: Secondary | ICD-10-CM | POA: Insufficient documentation

## 2020-12-18 NOTE — Progress Notes (Signed)
Patient here to establish care. No new concerns voiced.

## 2020-12-19 NOTE — Progress Notes (Signed)
Hematology/Oncology Consult note [facility] [phone] [phone]   Patient Care Team: [name], MD as PCP - General (Internal Medicine)  REFERRING PROVIDER: [name], MD  CHIEF COMPLAINTS/REASON FOR VISIT:  Evaluation of history of left lower extremity DVT  HISTORY OF PRESENTING ILLNESS:   Bianca Taylor is a  87 y.o.  female with PMH listed below was seen in consultation at the request of  Tama High III, MD  for evaluation of history of left lower extremity DVT.   11/16/20 She presented to PCP's office for evaluation of left lower extremity swelling and pain.  She does not recall any injury, trauma, although family member reports possible fall history.   11/16/20 Left lower extremity US showed short segment occlusive DVT involving mid aspect of the left femoral vein. Patient was started on eliquis.   Patient was accompanied by her husband who is also my patient.  Patient's left lower extremity swelling has improved.  Given the unprovoked DVT and remote history of cancer, PCP recommends her to establish with hematology oncology to determine whether this would better be pursued as a PET scan or CT chest, abdomen, pelvis  Remote history of left breast DCIS, ER positivein 2013, she had lumpectomy by Dr.Byrnett and radiation by Dr.Chrystal. , and reports taking tamoxifen for a few years.  Denies any breast concern today.  Denies any pain.    Review of Systems  Constitutional: Negative for appetite change, chills, fatigue and fever.  HENT:   Negative for hearing loss and voice change.   Eyes: Negative for eye problems.  Respiratory: Negative for chest tightness and cough.   Cardiovascular: Positive for leg swelling. Negative for chest pain.  Gastrointestinal: Negative for abdominal distention, abdominal pain and blood in stool.  Endocrine: Negative for hot flashes.  Genitourinary: Negative for difficulty urinating and  frequency.   Musculoskeletal: Negative for arthralgias.  Skin: Negative for itching and rash.  Neurological: Negative for extremity weakness.  Hematological: Negative for adenopathy.  Psychiatric/Behavioral: Negative for confusion.    MEDICAL HISTORY:  Past Medical History:  Diagnosis Date  . Breast cancer ([place]) 2013   left breast  . COPD (chronic obstructive pulmonary disease) ([place])   . GERD (gastroesophageal reflux disease)   . Hypercholesterolemia   . Hypertension   . Ischemic cardiomyopathy    a. 01/2017 Echo: EF 45-50%, mod inflat and inf HK, Gr1 DD.  Marland Kitchen ST elevation myocardial infarction (STEMI) of inferior wall ([place])    a. 01/2017 in setting of acute stroke-->medically managed.  . Stroke ([place])    a. 01/2017 MRI: 1 cm acute ischemic nonhemorrhagic lacunar type infarct involving the left lentiform nucleus. Mod cerebral atrophy, chronic microvascular isch dzs, multiple scattered remote infarcts, extensive chrnoic micro hemorrhages;  b. 01/2016 CTA head/neck: no acute IC/EC stenosis or dissection.    SURGICAL HISTORY: Past Surgical History:  Procedure Laterality Date  . BRAIN SURGERY    . BREAST BIOPSY Left    positive 03/2012  . BREAST BIOPSY Bilateral    negative 2000  . BREAST EXCISIONAL BIOPSY Left    positive 04/2012  . BREAST LUMPECTOMY Left 2013  . BREAST MAMMOSITE Left   . CATARACT EXTRACTION    . ENTROPIAN REPAIR Right 04/27/2018   Procedure: REPAIR OF ENTROPION, SUTURES AND EXTENSIVE;  Surgeon: Karle Starch, MD;  Location: [facility];  Service: Ophthalmology;  Laterality: Right;    SOCIAL HISTORY: Social History   Socioeconomic History  .  Marital status: Married    Spouse name: Not on file  . Number of children: Not on file  . Years of education: Not on file  . Highest education level: Not on file  Occupational History  . Not on file  Tobacco Use  . Smoking status: Former Smoker    Packs/day: 1.00    Years: 20.00    Pack years: 20.00    Types:  Cigarettes    Quit date: 1990    Years since quitting: 32.2  . Smokeless tobacco: Never Used  Vaping Use  . Vaping Use: Never used  Substance and Sexual Activity  . Alcohol use: No  . Drug use: Never  . Sexual activity: Not on file  Other Topics Concern  . Not on file  Social History Narrative  . Not on file   Social Determinants of Health   Financial Resource Strain: Not on file  Food Insecurity: Not on file  Transportation Needs: Not on file  Physical Activity: Not on file  Stress: Not on file  Social Connections: Not on file  Intimate Partner Violence: Not on file    FAMILY HISTORY: Family History  Problem Relation Age of Onset  . Heart attack Father     ALLERGIES:  is allergic to codeine, fluocinolone, moxifloxacin, pravastatin, risedronate, tetracyclines & related, and niacin.  MEDICATIONS:  Current Outpatient Medications  Medication Sig Dispense Refill  . acetaminophen (TYLENOL) 325 MG tablet Take 2 tablets (650 mg total) by mouth every 6 (six) hours as needed for mild pain (or Fever >/= 101).    Marland Kitchen aspirin EC 81 MG tablet Take 81 mg by mouth at bedtime.     Marland Kitchen atorvastatin (LIPITOR) 80 MG tablet Take 1 tablet (80 mg total) by mouth daily. 90 tablet 0  . butalbital-acetaminophen-caffeine (FIORICET, ESGIC) 50-325-40 MG tablet Take 1 tablet by mouth every 4 (four) hours as needed for headache.     Marland Kitchen ELIQUIS 5 MG TABS tablet Take 5 mg by mouth in the morning and at bedtime.    Marland Kitchen erythromycin ophthalmic ointment Apply to sutures 4 times a day for 10-12 days.  Discontinue if allergy develops and call our office 3.5 g 2  . furosemide (LASIX) 20 MG tablet Take 20 mg by mouth daily.    Marland Kitchen latanoprost (XALATAN) 0.005 % ophthalmic solution Place 1 drop into both eyes at bedtime.    . metoprolol tartrate (LOPRESSOR) 25 MG tablet TAKE ONE TABLET BY MOUTH TWICE DAILY 180 tablet 0  . pantoprazole (PROTONIX) 40 MG tablet Take 40 mg daily by mouth.    . telmisartan (MICARDIS) 80 MG  tablet Take 80 mg by mouth daily.    . nitroGLYCERIN (NITROSTAT) 0.4 MG SL tablet Place 1 tablet (0.4 mg total) under the tongue every 5 (five) minutes as needed for chest pain. Maximum of 3 doses. 25 tablet 4   No current facility-administered medications for this visit.     PHYSICAL EXAMINATION: ECOG PERFORMANCE STATUS: 2 - Symptomatic, <50% confined to bed Vitals:   12/18/20 1135  BP: 112/71  Pulse: 75  Resp: 18  Temp: (!) 97.2 F (36.2 C)   Filed Weights   12/18/20 1135  Weight: 112 lb 9.6 oz (51.1 kg)    Physical Exam Constitutional:      General: She is not in acute distress.    Comments: She sits in the wheelchair  HENT:     Head: Normocephalic and atraumatic.  Eyes:     General: No scleral  icterus. Cardiovascular:     Rate and Rhythm: Normal rate and regular rhythm.     Heart sounds: Normal heart sounds.  Pulmonary:     Effort: Pulmonary effort is normal. No respiratory distress.     Breath sounds: No wheezing.  Abdominal:     General: Bowel sounds are normal. There is no distension.     Palpations: Abdomen is soft.  Musculoskeletal:        General: No deformity. Normal range of motion.     Cervical back: Normal range of motion and neck supple.     Comments: Left lower extremity 1+ edema and mild erythema .  There is an erythematous area on dorsal side of her left foot.   Skin:    General: Skin is warm and dry.     Findings: No erythema or rash.  Neurological:     Mental Status: She is alert and oriented to person, place, and time. Mental status is at baseline.     Cranial Nerves: No cranial nerve deficit.     Coordination: Coordination normal.  Psychiatric:        Mood and Affect: Mood normal.     LABORATORY DATA:  I have reviewed the data as listed Lab Results  Component Value Date   WBC 7.3 06/14/2019   HGB 14.2 06/14/2019   HCT 43.1 06/14/2019   MCV 94.7 06/14/2019   PLT 567 (H) 06/14/2019   No results for input(s): NA, K, CL, CO2,  GLUCOSE, BUN, CREATININE, CALCIUM, GFRNONAA, GFRAA, PROT, ALBUMIN, AST, ALT, ALKPHOS, BILITOT, BILIDIR, IBILI in the last 8760 hours. Iron/TIBC/Ferritin/ %Sat No results found for: IRON, TIBC, FERRITIN, IRONPCTSAT    RADIOGRAPHIC STUDIES: I have personally reviewed the radiological images as listed and agreed with the findings in the report. US Venous Img Lower Unilateral Left (DVT)  Result Date: 11/16/2020 CLINICAL DATA:  Left lower extremity edema.  Evaluate for DVT. EXAM: LEFT LOWER EXTREMITY VENOUS DOPPLER ULTRASOUND TECHNIQUE: Gray-scale sonography with graded compression, as well as color Doppler and duplex ultrasound were performed to evaluate the lower extremity deep venous systems from the level of the common femoral vein and including the common femoral, femoral, profunda femoral, popliteal and calf veins including the posterior tibial, peroneal and gastrocnemius veins when visible. The superficial great saphenous vein was also interrogated. Spectral Doppler was utilized to evaluate flow at rest and with distal augmentation maneuvers in the common femoral, femoral and popliteal veins. COMPARISON:  None. FINDINGS: Contralateral Common Femoral Vein: Respiratory phasicity is normal and symmetric with the symptomatic side. No evidence of thrombus. Normal compressibility. Common Femoral Vein: No evidence of thrombus. Normal compressibility, respiratory phasicity and response to augmentation. Saphenofemoral Junction: No evidence of thrombus. Normal compressibility and flow on color Doppler imaging. Profunda Femoral Vein: No evidence of thrombus. Normal compressibility and flow on color Doppler imaging. Femoral Vein: While the proximal (image 17) and distal (image 26) aspects of the left femoral vein appear widely patent, there is hypoechoic occlusive thrombus involving the mid aspect the left femoral vein (images 20 and 21). Popliteal Vein: No definite evidence of thrombus. Normal compressibility,  respiratory phasicity and response to augmentation. Calf Veins: No evidence of thrombus. Normal compressibility and flow on color Doppler imaging. Superficial Great Saphenous Vein: No evidence of thrombus. Normal compressibility. Other Findings:  None. IMPRESSION: The examination is positive for short-segment occlusive DVT involving the mid aspect of the left femoral vein. Electronically Signed   By: Sandi Mariscal M.D.   On: 11/16/2020  17:07      ASSESSMENT & PLAN:  1. Acute deep vein thrombosis (DVT) of femoral vein of left lower extremity (HCC)   2. History of ductal carcinoma in situ (DCIS) of breast    # Recent history of acute lower extremity DVT Unprovoked. Likely due to patient's inactivity.  Agree with anticoagulation with Eliquis 5mg  BID. Recommend 3-6 months of anticoagulation and consider switch to Eliquis 2.5mg  BID. Need to consider her fall risk Check cbc, cmp, prothrombin mutation, factor 5 leiden mutation, antiphospholipid panel, protein S, protein c, Jak2 mutation with reflex. BCR-ABL1 FISH  History of DCIS Discussed with patient, husband and also per patient's request, called her son and discussed.  Although unprovoked DVT can be associated to cancer, it is not standard of care to perform CT images for cancer cancer screening unless clinically indicated. She is asymptomatic except occasional abdominal discomfort. I discussed about the option of proceed with CT scan and both patient and her family members prefer to defer at this point given her advanced age.     Orders Placed This Encounter  Procedures  . JAK2 V617F, w Reflex to CALR/E12/MPL    Standing Status:   Future    Number of Occurrences:   1    Standing Expiration Date:   12/18/2021  . Factor 5 leiden    Standing Status:   Future    Number of Occurrences:   1    Standing Expiration Date:   12/18/2021  . ANTIPHOSPHOLIPID SYNDROME PROF    Standing Status:   Future    Number of Occurrences:   1    Standing Expiration  Date:   12/18/2021  . Protein S, total and free    Standing Status:   Future    Number of Occurrences:   1    Standing Expiration Date:   12/18/2021  . Prothrombin gene mutation    Standing Status:   Future    Number of Occurrences:   1    Standing Expiration Date:   12/18/2021  . Protein C activity    Standing Status:   Future    Number of Occurrences:   1    Standing Expiration Date:   12/18/2021  . BCR-ABL1 FISH    Standing Status:   Future    Number of Occurrences:   1    Standing Expiration Date:   12/18/2021    All questions were answered. The patient knows to call the clinic with any problems questions or concerns.  cc [name], MD    Return of visit: 2 weeks.  Thank you for this kind referral and the opportunity to participate in the care of this patient. A copy of today's note is routed to referring provider    Earlie Server, MD, PhD Hematology Oncology [facility] Pager- [phone] 12/19/2020

## 2020-12-21 LAB — ANTIPHOSPHOLIPID SYNDROME PROF
Anticardiolipin IgG: <9 GPL U/mL (ref 0–14)
Anticardiolipin IgM: 9 MPL U/mL (ref 0–12)
DRVVT: 58.2 s — ABNORMAL HIGH (ref 0.0–47.0)
PTT Lupus Anticoagulant: 39.2 s (ref 0.0–51.9)

## 2020-12-21 LAB — PROTEIN C ACTIVITY: Protein C Activity: 116 % (ref 73–180)

## 2020-12-21 LAB — DRVVT CONFIRM: dRVVT Confirm: 0.8 ratio (ref 0.8–1.2)

## 2020-12-21 LAB — BCR-ABL1 FISH
Cells Analyzed: 200
Cells Counted: 200

## 2020-12-21 LAB — PROTEIN S, TOTAL AND FREE
Protein S Ag, Free: 103 % (ref 61–136)
Protein S Ag, Total: 96 % (ref 60–150)

## 2020-12-21 LAB — DRVVT MIX: dRVVT Mix: 48.1 s — ABNORMAL HIGH (ref 0.0–40.4)

## 2020-12-23 LAB — JAK2 V617F, W REFLEX TO CALR/E12/MPL

## 2020-12-24 ENCOUNTER — Ambulatory Visit
Admission: RE | Admit: 2020-12-24 | Discharge: 2020-12-24 | Disposition: A | Discharge status: Home / Self Care | Payer: Medicare Other | Source: Ambulatory Visit | Attending: Internal Medicine | Admitting: Internal Medicine

## 2020-12-24 ENCOUNTER — Other Ambulatory Visit: Payer: Self-pay

## 2020-12-24 DIAGNOSIS — Z1231 Encounter for screening mammogram for malignant neoplasm of breast: Secondary | ICD-10-CM | POA: Insufficient documentation

## 2020-12-25 LAB — FACTOR 5 LEIDEN

## 2020-12-26 LAB — PROTHROMBIN GENE MUTATION

## 2021-01-01 ENCOUNTER — Encounter: Payer: Self-pay | Admitting: Oncology

## 2021-01-01 ENCOUNTER — Other Ambulatory Visit: Payer: Self-pay

## 2021-01-01 ENCOUNTER — Inpatient Hospital Stay: Payer: Medicare Other | Attending: Oncology | Admitting: Oncology

## 2021-01-01 VITALS — BP 135/91 | HR 98 | Temp 99.6°F | Resp 16 | Wt 108.0 lb

## 2021-01-01 DIAGNOSIS — Z7901 Long term (current) use of anticoagulants: Secondary | ICD-10-CM | POA: Insufficient documentation

## 2021-01-01 DIAGNOSIS — J449 Chronic obstructive pulmonary disease, unspecified: Secondary | ICD-10-CM | POA: Insufficient documentation

## 2021-01-01 DIAGNOSIS — D75839 Thrombocytosis, unspecified: Secondary | ICD-10-CM | POA: Diagnosis not present

## 2021-01-01 DIAGNOSIS — Z86718 Personal history of other venous thrombosis and embolism: Secondary | ICD-10-CM | POA: Diagnosis present

## 2021-01-01 DIAGNOSIS — Z8673 Personal history of transient ischemic attack (TIA), and cerebral infarction without residual deficits: Secondary | ICD-10-CM | POA: Insufficient documentation

## 2021-01-01 DIAGNOSIS — Z86000 Personal history of in-situ neoplasm of breast: Secondary | ICD-10-CM | POA: Diagnosis not present

## 2021-01-01 DIAGNOSIS — I10 Essential (primary) hypertension: Secondary | ICD-10-CM | POA: Insufficient documentation

## 2021-01-01 DIAGNOSIS — Z1589 Genetic susceptibility to other disease: Secondary | ICD-10-CM

## 2021-01-01 DIAGNOSIS — Z7982 Long term (current) use of aspirin: Secondary | ICD-10-CM | POA: Diagnosis not present

## 2021-01-01 DIAGNOSIS — I82412 Acute embolism and thrombosis of left femoral vein: Secondary | ICD-10-CM

## 2021-01-01 DIAGNOSIS — Z79899 Other long term (current) drug therapy: Secondary | ICD-10-CM | POA: Diagnosis not present

## 2021-01-01 DIAGNOSIS — Z87891 Personal history of nicotine dependence: Secondary | ICD-10-CM | POA: Diagnosis not present

## 2021-01-01 DIAGNOSIS — I252 Old myocardial infarction: Secondary | ICD-10-CM | POA: Diagnosis not present

## 2021-01-01 NOTE — Progress Notes (Signed)
Hematology/Oncology Consult note [facility] [phone] [phone]   Patient Care Team: [name], MD as PCP - General (Internal Medicine)  REFERRING PROVIDER: [name], MD  CHIEF COMPLAINTS/REASON FOR VISIT:  Evaluation of history of left lower extremity DVT  HISTORY OF PRESENTING ILLNESS:   Bianca Taylor is a  87 y.o.  female with PMH listed below was seen in consultation at the request of  Tama High III, MD  for evaluation of history of left lower extremity DVT.   11/16/20 She presented to PCP's office for evaluation of left lower extremity swelling and pain.  She does not recall any injury, trauma, although family member reports possible fall history.   11/16/20 Left lower extremity US showed short segment occlusive DVT involving mid aspect of the left femoral vein. Patient was started on eliquis.   Patient was accompanied by her husband who is also my patient.  Patient's left lower extremity swelling has improved.  Given the unprovoked DVT and remote history of cancer, PCP recommends her to establish with hematology oncology to determine whether this would better be pursued as a PET scan or CT chest, abdomen, pelvis  Remote history of left breast DCIS, ER positivein 2013, she had lumpectomy by Dr.Byrnett and radiation by Dr.Chrystal. , and reports taking tamoxifen for a few years.  Denies any breast concern today.  Denies any pain.   INTERVAL HISTORY [name] is a 87 y.o. female who has above history reviewed by me today presents for follow up visit for management of acute DVT Problems and complaints are listed below: Left lower extremity swelling has improved. Patient was accompanied by husband.  Per patient's request, patient's son was called  Review of Systems  Constitutional: Negative for appetite change, chills, fatigue and fever.  HENT:   Negative for hearing loss and voice change.   Eyes: Negative  for eye problems.  Respiratory: Negative for chest tightness and cough.   Cardiovascular: Positive for leg swelling. Negative for chest pain.  Gastrointestinal: Negative for abdominal distention, abdominal pain and blood in stool.  Endocrine: Negative for hot flashes.  Genitourinary: Negative for difficulty urinating and frequency.   Musculoskeletal: Negative for arthralgias.  Skin: Negative for itching and rash.  Neurological: Negative for extremity weakness.  Hematological: Negative for adenopathy.  Psychiatric/Behavioral: Negative for confusion.    MEDICAL HISTORY:  Past Medical History:  Diagnosis Date  . Breast cancer ([place]) 2013   left breast  . COPD (chronic obstructive pulmonary disease) ([place])   . GERD (gastroesophageal reflux disease)   . Hypercholesterolemia   . Hypertension   . Ischemic cardiomyopathy    a. 01/2017 Echo: EF 45-50%, mod inflat and inf HK, Gr1 DD.  Marland Kitchen ST elevation myocardial infarction (STEMI) of inferior wall ([place])    a. 01/2017 in setting of acute stroke-->medically managed.  . Stroke ([place])    a. 01/2017 MRI: 1 cm acute ischemic nonhemorrhagic lacunar type infarct involving the left lentiform nucleus. Mod cerebral atrophy, chronic microvascular isch dzs, multiple scattered remote infarcts, extensive chrnoic micro hemorrhages;  b. 01/2016 CTA head/neck: no acute IC/EC stenosis or dissection.    SURGICAL HISTORY: Past Surgical History:  Procedure Laterality Date  . BRAIN SURGERY    . BREAST BIOPSY Left    positive 03/2012  . BREAST BIOPSY Bilateral    negative 2000  . BREAST EXCISIONAL BIOPSY Left    positive 04/2012  . BREAST LUMPECTOMY Left 2013  .  BREAST MAMMOSITE Left   . CATARACT EXTRACTION    . ENTROPIAN REPAIR Right 04/27/2018   Procedure: REPAIR OF ENTROPION, SUTURES AND EXTENSIVE;  Surgeon: Karle Starch, MD;  Location: [facility];  Service: Ophthalmology;  Laterality: Right;    SOCIAL HISTORY: Social History   Socioeconomic History   . Marital status: Married    Spouse name: Not on file  . Number of children: Not on file  . Years of education: Not on file  . Highest education level: Not on file  Occupational History  . Not on file  Tobacco Use  . Smoking status: Former Smoker    Packs/day: 1.00    Years: 20.00    Pack years: 20.00    Types: Cigarettes    Quit date: 1990    Years since quitting: 32.2  . Smokeless tobacco: Never Used  Vaping Use  . Vaping Use: Never used  Substance and Sexual Activity  . Alcohol use: No  . Drug use: Never  . Sexual activity: Not on file  Other Topics Concern  . Not on file  Social History Narrative  . Not on file   Social Determinants of Health   Financial Resource Strain: Not on file  Food Insecurity: Not on file  Transportation Needs: Not on file  Physical Activity: Not on file  Stress: Not on file  Social Connections: Not on file  Intimate Partner Violence: Not on file    FAMILY HISTORY: Family History  Problem Relation Age of Onset  . Heart attack Father     ALLERGIES:  is allergic to codeine, fluocinolone, moxifloxacin, pravastatin, risedronate, tetracyclines & related, and niacin.  MEDICATIONS:  Current Outpatient Medications  Medication Sig Dispense Refill  . acetaminophen (TYLENOL) 325 MG tablet Take 2 tablets (650 mg total) by mouth every 6 (six) hours as needed for mild pain (or Fever >/= 101).    Marland Kitchen aspirin EC 81 MG tablet Take 81 mg by mouth at bedtime.     Marland Kitchen atorvastatin (LIPITOR) 80 MG tablet Take 1 tablet (80 mg total) by mouth daily. 90 tablet 0  . butalbital-acetaminophen-caffeine (FIORICET, ESGIC) 50-325-40 MG tablet Take 1 tablet by mouth every 4 (four) hours as needed for headache.     Marland Kitchen ELIQUIS 5 MG TABS tablet Take 5 mg by mouth in the morning and at bedtime.    Marland Kitchen erythromycin ophthalmic ointment Apply to sutures 4 times a day for 10-12 days.  Discontinue if allergy develops and call our office 3.5 g 2  . furosemide (LASIX) 20 MG tablet  Take 20 mg by mouth daily.    Marland Kitchen latanoprost (XALATAN) 0.005 % ophthalmic solution Place 1 drop into both eyes at bedtime.    . metoprolol tartrate (LOPRESSOR) 25 MG tablet TAKE ONE TABLET BY MOUTH TWICE DAILY 180 tablet 0  . pantoprazole (PROTONIX) 40 MG tablet Take 40 mg daily by mouth.    . telmisartan (MICARDIS) 80 MG tablet Take 80 mg by mouth daily.     No current facility-administered medications for this visit.     PHYSICAL EXAMINATION: ECOG PERFORMANCE STATUS: 2 - Symptomatic, <50% confined to bed Vitals:   01/01/21 1023  BP: (!) 135/91  Pulse: 98  Resp: 16  Temp: 99.6 F (37.6 C)  SpO2: 97%   Filed Weights   01/01/21 1023  Weight: 108 lb (49 kg)    Physical Exam Constitutional:      General: She is not in acute distress.    Comments: She  sits in the wheelchair  HENT:     Head: Normocephalic and atraumatic.  Eyes:     General: No scleral icterus. Cardiovascular:     Rate and Rhythm: Normal rate and regular rhythm.     Heart sounds: Normal heart sounds.  Pulmonary:     Effort: Pulmonary effort is normal. No respiratory distress.     Breath sounds: No wheezing.  Abdominal:     General: Bowel sounds are normal. There is no distension.     Palpations: Abdomen is soft.  Musculoskeletal:        General: No deformity. Normal range of motion.     Cervical back: Normal range of motion and neck supple.     Comments: Left lower extremity trace edema and mild erythema .    Skin:    General: Skin is warm and dry.     Findings: No erythema or rash.  Neurological:     Mental Status: She is alert and oriented to person, place, and time. Mental status is at baseline.     Cranial Nerves: No cranial nerve deficit.     Coordination: Coordination normal.  Psychiatric:        Mood and Affect: Mood normal.     LABORATORY DATA:  I have reviewed the data as listed Lab Results  Component Value Date   WBC 7.3 06/14/2019   HGB 14.2 06/14/2019   HCT 43.1 06/14/2019   MCV  94.7 06/14/2019   PLT 567 (H) 06/14/2019   No results for input(s): NA, K, CL, CO2, GLUCOSE, BUN, CREATININE, CALCIUM, GFRNONAA, GFRAA, PROT, ALBUMIN, AST, ALT, ALKPHOS, BILITOT, BILIDIR, IBILI in the last 8760 hours. Iron/TIBC/Ferritin/ %Sat No results found for: IRON, TIBC, FERRITIN, IRONPCTSAT    RADIOGRAPHIC STUDIES: I have personally reviewed the radiological images as listed and agreed with the findings in the report. US Venous Img Lower Unilateral Left (DVT)  Result Date: 11/16/2020 CLINICAL DATA:  Left lower extremity edema.  Evaluate for DVT. EXAM: LEFT LOWER EXTREMITY VENOUS DOPPLER ULTRASOUND TECHNIQUE: Gray-scale sonography with graded compression, as well as color Doppler and duplex ultrasound were performed to evaluate the lower extremity deep venous systems from the level of the common femoral vein and including the common femoral, femoral, profunda femoral, popliteal and calf veins including the posterior tibial, peroneal and gastrocnemius veins when visible. The superficial great saphenous vein was also interrogated. Spectral Doppler was utilized to evaluate flow at rest and with distal augmentation maneuvers in the common femoral, femoral and popliteal veins. COMPARISON:  None. FINDINGS: Contralateral Common Femoral Vein: Respiratory phasicity is normal and symmetric with the symptomatic side. No evidence of thrombus. Normal compressibility. Common Femoral Vein: No evidence of thrombus. Normal compressibility, respiratory phasicity and response to augmentation. Saphenofemoral Junction: No evidence of thrombus. Normal compressibility and flow on color Doppler imaging. Profunda Femoral Vein: No evidence of thrombus. Normal compressibility and flow on color Doppler imaging. Femoral Vein: While the proximal (image 17) and distal (image 26) aspects of the left femoral vein appear widely patent, there is hypoechoic occlusive thrombus involving the mid aspect the left femoral vein (images 20  and 21). Popliteal Vein: No definite evidence of thrombus. Normal compressibility, respiratory phasicity and response to augmentation. Calf Veins: No evidence of thrombus. Normal compressibility and flow on color Doppler imaging. Superficial Great Saphenous Vein: No evidence of thrombus. Normal compressibility. Other Findings:  None. IMPRESSION: The examination is positive for short-segment occlusive DVT involving the mid aspect of the left femoral vein. Electronically Signed  By: Sandi Mariscal M.D.   On: 11/16/2020 17:07   MM 3D SCREEN BREAST BILATERAL  Result Date: 12/25/2020 CLINICAL DATA:  Screening. EXAM: DIGITAL SCREENING BILATERAL MAMMOGRAM WITH TOMOSYNTHESIS AND CAD TECHNIQUE: Bilateral screening digital craniocaudal and mediolateral oblique mammograms were obtained. Bilateral screening digital breast tomosynthesis was performed. The images were evaluated with computer-aided detection. COMPARISON:  Previous exam(s). ACR Breast Density Category b: There are scattered areas of fibroglandular density. FINDINGS: There are no findings suspicious for malignancy. The images were evaluated with computer-aided detection. IMPRESSION: No mammographic evidence of malignancy. A result letter of this screening mammogram will be mailed directly to the patient. RECOMMENDATION: Screening mammogram in one year. (Code:SM-B-01Y) BI-RADS CATEGORY  1: Negative. Electronically Signed   By: Kristopher Oppenheim M.D.   On: 12/25/2020 13:24      ASSESSMENT & PLAN:  1. Acute deep vein thrombosis (DVT) of femoral vein of left lower extremity (HCC)   2. History of ductal carcinoma in situ (DCIS) of breast    # Recent history of acute lower extremity DVT Unprovoked. Likely due to patient's inactivity.  Clinically she is doing better.  Continue anticoagulation with Eliquis 5 mg twice daily.  Recommend at least 6 months of anticoagulation. Negative factor V Leiden mutation, prothrombin gene mutation.  Antiphospholipid panel  negative.  No lupus anticoagulant was found.  # Thrombocytosis, JAK2 positivity. 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  Cannot rule out polycythemia, myelofibrosis. Discussed with patient, her husband and patient's son that a standard of care will be to proceed with a bone marrow biopsy for further evaluation.  Given patient's multiple comorbidities, advanced age, patient and her family prefer to defer a marrow biopsy at this point which I think is very reasonable.  Hydroxyurea can be considered and patient's family want to defer at this point. Recommend continue current anticoagulation regimen with Eliquis 5 mg twice daily.  After she completes 6 months of anticoagulation, recommend to switch to Eliquis 2.5 mg twice daily.  Fall precaution was discussed.   History of DCIS Discussed with patient, husband and also per patient's request, called her son and discussed.  Although unprovoked DVT can be associated to cancer, it is not standard of care to perform CT images for cancer cancer screening unless clinically indicated. She is asymptomatic except occasional abdominal discomfort. I discussed about the option of proceed with CT scan and both patient and her family members prefer to defer at this point given her advanced age.    Follow-up in 3 months Orders Placed This Encounter  Procedures  . CBC with Differential/Platelet    Standing Status:   Future    Standing Expiration Date:   01/01/2022  . Comprehensive metabolic panel    Standing Status:   Future    Standing Expiration Date:   01/01/2022    All questions were answered. The patient knows to call the clinic with any problems questions or concerns.  cc [name], MD    Thank you for this kind referral and the opportunity to participate in the care of this patient. A copy of today's note is routed to referring provider    Earlie Server, MD, PhD Hematology Oncology [facility] Pager-  [phone] 01/01/2021

## 2021-01-03 ENCOUNTER — Ambulatory Visit (INDEPENDENT_AMBULATORY_CARE_PROVIDER_SITE_OTHER): Payer: Medicare Other | Admitting: Internal Medicine

## 2021-01-03 ENCOUNTER — Other Ambulatory Visit: Payer: Self-pay

## 2021-01-03 ENCOUNTER — Encounter: Payer: Self-pay | Admitting: Internal Medicine

## 2021-01-03 VITALS — BP 124/70 | HR 72 | Ht 60.0 in | Wt 108.0 lb

## 2021-01-03 DIAGNOSIS — I471 Supraventricular tachycardia: Secondary | ICD-10-CM

## 2021-01-03 DIAGNOSIS — Z8673 Personal history of transient ischemic attack (TIA), and cerebral infarction without residual deficits: Secondary | ICD-10-CM

## 2021-01-03 DIAGNOSIS — I255 Ischemic cardiomyopathy: Secondary | ICD-10-CM

## 2021-01-03 DIAGNOSIS — I2510 Atherosclerotic heart disease of native coronary artery without angina pectoris: Secondary | ICD-10-CM

## 2021-01-03 DIAGNOSIS — I10 Essential (primary) hypertension: Secondary | ICD-10-CM

## 2021-01-03 DIAGNOSIS — I824Y2 Acute embolism and thrombosis of unspecified deep veins of left proximal lower extremity: Secondary | ICD-10-CM

## 2021-01-03 NOTE — Progress Notes (Signed)
Follow-up Outpatient Visit Date: 01/03/2021  Primary Care Provider: [name], MD [place] [address]  Chief Complaint: Follow-up coronary artery disease  HPI:  Bianca Taylor is a 87 y.o. female with history of coronary artery disease with inferior STEMI in the setting of acute stroke in5/2018(MI managed medically), hypertension, hyperlipidemia, unprovoked DVT (10/2020) managed by Dr. Tasia Catchings, breast cancer, COPD, and GERD, who presents for follow-up of coronary artery disease.  I last saw her in 10/2019, at which time she was doing relatively well.  She was diagnosed with left femoral DVT in 10/2020, thought to be due to inactivity.  Hypercoagulable work-up by Dr. Tasia Catchings was unrevealing.  At least 6 months of anticoagulation with apixaban was recommended.  Today, Bianca Taylor reports feeling fairly well.  Her main complaint is of intermittent abdominal pain that seems to improve when she voids and has a bowel movement.  She denies chest pain, palpitations, lightheadedness, and edema.  She ambulates with a cane and has not had any falls.  She denies new focal neurologic deficits.  Her husband reports that telmisartan was discontinued around the time of her DVT diagnosis due to low blood pressures.  Home blood pressures are now typically in the upper normal range, sometimes up to 140/80.  She remains on apixaban without bleeding.  --------------------------------------------------------------------------------------------------  Past Medical History:  Diagnosis Date  . Breast cancer ([place]) 2013   left breast  . COPD (chronic obstructive pulmonary disease) ([place])   . DVT (deep venous thrombosis) ([place]) 10/2020   LLE  . GERD (gastroesophageal reflux disease)   . Hypercholesterolemia   . Hypertension   . Ischemic cardiomyopathy    a. 01/2017 Echo: EF 45-50%, mod inflat and inf HK, Gr1 DD.  Marland Kitchen ST elevation myocardial infarction (STEMI) of inferior wall ([place])     a. 01/2017 in setting of acute stroke-->medically managed.  . Stroke ([place])    a. 01/2017 MRI: 1 cm acute ischemic nonhemorrhagic lacunar type infarct involving the left lentiform nucleus. Mod cerebral atrophy, chronic microvascular isch dzs, multiple scattered remote infarcts, extensive chrnoic micro hemorrhages;  b. 01/2016 CTA head/neck: no acute IC/EC stenosis or dissection.   Past Surgical History:  Procedure Laterality Date  . BRAIN SURGERY    . BREAST BIOPSY Left    positive 03/2012  . BREAST BIOPSY Bilateral    negative 2000  . BREAST EXCISIONAL BIOPSY Left    positive 04/2012  . BREAST LUMPECTOMY Left 2013  . BREAST MAMMOSITE Left   . CATARACT EXTRACTION    . ENTROPIAN REPAIR Right 04/27/2018   Procedure: REPAIR OF ENTROPION, SUTURES AND EXTENSIVE;  Surgeon: Karle Starch, MD;  Location: [facility];  Service: Ophthalmology;  Laterality: Right;     Recent CV Pertinent Labs: Lab Results  Component Value Date   CHOL 171 02/19/2017   HDL 49 02/19/2017   LDLCALC 99 02/19/2017   TRIG 114 02/19/2017   CHOLHDL 3.5 02/19/2017   INR 1.06 02/18/2017   K 4.5 06/14/2019   K 4.0 07/15/2012   BUN 12 06/14/2019   BUN 11 07/15/2012   CREATININE 0.61 06/14/2019   CREATININE 0.68 01/05/2013    Past medical and surgical history were reviewed and updated in EPIC.  Current Meds  Medication Sig  . acetaminophen (TYLENOL) 325 MG tablet Take 2 tablets (650 mg total) by mouth every 6 (six) hours as needed for mild pain (or Fever >/= 101).  Marland Kitchen atorvastatin (LIPITOR) 40  MG tablet Take 80 mg by mouth daily.  Marland Kitchen ELIQUIS 5 MG TABS tablet Take 5 mg by mouth in the morning and at bedtime.  . furosemide (LASIX) 20 MG tablet Take 20 mg by mouth daily as needed.  . metoprolol tartrate (LOPRESSOR) 25 MG tablet TAKE ONE TABLET BY MOUTH TWICE DAILY  . pantoprazole (PROTONIX) 40 MG tablet Take 40 mg daily by mouth.    Allergies: Codeine, Fluocinolone, Moxifloxacin, Pravastatin, Risedronate,  Tetracyclines & related, and Niacin  Social History   Tobacco Use  . Smoking status: Former Smoker    Packs/day: 1.00    Years: 20.00    Pack years: 20.00    Types: Cigarettes    Quit date: 1990    Years since quitting: 32.3  . Smokeless tobacco: Never Used  Vaping Use  . Vaping Use: Never used  Substance Use Topics  . Alcohol use: No  . Drug use: Never    Family History  Problem Relation Age of Onset  . Heart attack Father     Review of Systems: A 12-system review of systems was performed and was negative except as noted in the HPI.  --------------------------------------------------------------------------------------------------  Physical Exam: BP 124/70 (BP Location: Right Arm, Patient Position: Sitting, Cuff Size: Normal)   Pulse 72   Ht 5' (1.524 m)   Wt 108 lb (49 kg)   SpO2 96%   BMI 21.09 kg/m   General:  NAD.  Accompanied by her husband and son. Neck: No JVD or HJR. Lungs: Clear to auscultation bilaterally without wheezes or crackles. Heart: Regular rate and rhythm without murmurs, rubs, or gallops. Abdomen: Soft, nontender, nondistended. Extremities: No lower extremity edema.  Prominent varicose vein overlying dorsum of the left foot.  EKG: Sinus rhythm with inferolateral T wave inversions and inferior infarct; lateral T wave changes are more pronounced than prior tracing on 10/26/2019 allowing for baseline artifact.  Lab Results  Component Value Date   WBC 7.3 06/14/2019   HGB 14.2 06/14/2019   HCT 43.1 06/14/2019   MCV 94.7 06/14/2019   PLT 567 (H) 06/14/2019    Lab Results  Component Value Date   NA 138 06/14/2019   K 4.5 06/14/2019   CL 104 06/14/2019   CO2 28 06/14/2019   BUN 12 06/14/2019   CREATININE 0.61 06/14/2019   GLUCOSE 113 (H) 06/14/2019   ALT 23 02/18/2017    Lab Results  Component Value Date   CHOL 171 02/19/2017   HDL 49 02/19/2017   LDLCALC 99 02/19/2017   TRIG 114 02/19/2017   CHOLHDL 3.5 02/19/2017     --------------------------------------------------------------------------------------------------  ASSESSMENT AND PLAN: Coronary artery disease: Bianca Taylor has not had any symptoms to suggest worsening coronary insufficiency.  EKG today is consistent with prior inferior infarct.  Inferolateral T wave inversions are slightly more pronounced in the lateral precordial leads compared with prior tracing a year ago.  This is nonspecific.  In the setting of her advanced age and comorbidities as well as lack of symptoms, we have agreed to defer additional testing/intervention.  Continue current medications for secondary prevention.  Ischemic cardiomyopathy: Bianca Taylor appears euvolemic without symptoms of decompensated heart failure.  Continue metoprolol.  Given prior hypotension, we will defer restarting telmisartan though this can be considered if blood pressure continues to trend up.  History of stroke: Continue secondary prevention.  Given anticoagulation with apixaban, I think it is reasonable to defer aspirin at this time.  Statin therapy should be continued for target LDL less  than 70 (lipids followed by Dr. Caryl Comes).  Hypertension: Blood pressure well controlled today.  Continue current dose of metoprolol.  Defer restarting telmisartan unless home blood pressures are consistently above 140/90.  PSVT: EKG today shows sinus rhythm.  No palpitations reported.  Continue low-dose metoprolol.  DVT: Continue anticoagulation with apixaban with ongoing follow-up per Drs. Tasia Catchings and Caryl Comes.  Follow-up: Return to clinic in 6 months.  Nelva Bush, MD 01/03/2021 11:53 AM

## 2021-01-03 NOTE — Patient Instructions (Signed)
Medication Instructions:  Your physician recommends that you continue on your current medications as directed. Please refer to the Current Medication list given to you today.  *If you need a refill on your cardiac medications before your next appointment, please call your pharmacy*   Lab Work: None ordered If you have labs (blood work) drawn today and your tests are completely normal, you will receive your results only by: Marland Kitchen MyChart Message (if you have MyChart) OR . A paper copy in the mail If you have any lab test that is abnormal or we need to change your treatment, we will call you to review the results.   Testing/Procedures: None ordered   Follow-Up: At [facility], you and your health needs are our priority.  As part of our continuing mission to provide you with exceptional heart care, we have created designated Provider Care Teams.  These Care Teams include your primary Cardiologist (physician) and Advanced Practice Providers (APPs -  Physician Assistants and Nurse Practitioners) who all work together to provide you with the care you need, when you need it.  We recommend signing up for the patient portal called "MyChart".  Sign up information is provided on this After Visit Summary.  MyChart is used to connect with patients for Virtual Visits (Telemedicine).  Patients are able to view lab/test results, encounter notes, upcoming appointments, etc.  Non-urgent messages can be sent to your provider as well.   To learn more about what you can do with MyChart, go to [email or web address].    Your next appointment:   Your physician wants you to follow-up in: 6 months You will receive a reminder letter in the mail two months in advance. If you don't receive a letter, please call our office to schedule the follow-up appointment.   The format for your next appointment:   In Person  Provider:   You may see Nelva Bush, MD or one of the following Advanced Practice Providers on  your designated Care Team:    Murray Hodgkins, NP  Christell Faith, PA-C  Marrianne Mood, PA-C  Cadence Kathlen Mody, [place]  Laurann [place], NP    Other Instructions Your physician has requested that you regularly monitor and record your blood pressure readings at home. Please use the same machine at the same time of day to check your readings and record them. Please call the office if your blood pressure is consistently above 140/90.

## 2021-01-04 ENCOUNTER — Encounter: Payer: Self-pay | Admitting: Internal Medicine

## 2021-01-04 DIAGNOSIS — I471 Supraventricular tachycardia: Secondary | ICD-10-CM | POA: Insufficient documentation

## 2021-01-21 ENCOUNTER — Telehealth: Payer: Self-pay | Admitting: *Deleted

## 2021-01-21 NOTE — Telephone Encounter (Signed)
Husband Juanda Crumble called stating that patient is on Eliquis and needs to have a tooth pulled and is asking if she needs to stop the Eliquis before it is pulled. Please advise

## 2021-01-21 NOTE — Telephone Encounter (Signed)
She just had DVT in February. If her dentist is comfortable, I recommend no interruption of eliquis. Recommend dentist to apply local measure to decrease risk of bleeding.

## 2021-01-22 NOTE — Telephone Encounter (Signed)
Patient's husband notified MD recommendation.  She has not been evaluated by dentist yet and will let us know if there are any concerns we need to address afterwards.

## 2021-01-22 NOTE — Telephone Encounter (Signed)
Left message for patient/husband to call for MD recommendation.

## 2021-02-11 ENCOUNTER — Telehealth: Payer: Self-pay | Admitting: *Deleted

## 2021-02-11 NOTE — Telephone Encounter (Addendum)
Mr Mula called stating that Dr Tasia Catchings had said that patient would transition to ASA at some point in time and she is due for a refill of Eliquis very soon and is is asking if she can go on ASA. She has a dental appointment and needs some work done that dentist will not do until she is off the Eliquis. Please advise

## 2021-02-11 NOTE — Telephone Encounter (Signed)
I discussed with Mr Bianca Taylor Dr Collie Siad response that she does not want patient to go on ASA, but rather a maintenance dose of 2.5 mg twice a day of Eliquis. I also advised and reminded him that 3 weeks ago, she had said that she does not want to interrupt patient Eliquis and for the dentist to use measures on his end to control bleeding. He verbalized understanding

## 2021-02-13 ENCOUNTER — Telehealth: Payer: Self-pay | Admitting: *Deleted

## 2021-02-13 NOTE — Telephone Encounter (Signed)
Oral Surgeon would like to speak with Dr. Tasia Catchings regarding patients blood thinners. He is planning an oral surgeon procedure. [phone]

## 2021-02-13 NOTE — Telephone Encounter (Signed)
Called and pressed 0 trying to leave a message,but not able to. This the [facility] dental implant main number. Please leave my cell phone for Dr.Park to call.

## 2021-02-14 NOTE — Telephone Encounter (Signed)
Left phone number and message with front office for Dr. Romilda Garret to call you back.

## 2021-02-25 ENCOUNTER — Telehealth: Payer: Self-pay | Admitting: *Deleted

## 2021-02-25 ENCOUNTER — Telehealth: Payer: Self-pay | Admitting: Oncology

## 2021-02-25 ENCOUNTER — Other Ambulatory Visit: Payer: Self-pay | Admitting: Internal Medicine

## 2021-02-25 NOTE — Telephone Encounter (Signed)
Pre-op medical clearance form was faxed on 5/20. Spoke to [place] at [employer] and she confirmed receipt of form. Called Mr. Bianca Taylor and notified him that form was sent on 5/20 and pt should be good for procedure. He will contact [facility] to schedule procedure.

## 2021-02-25 NOTE — Telephone Encounter (Signed)
Patient's husband Juanda Crumble) called and stated that they are waiting for clearance for patient to have needed tooth extractions. Patient is on blood thinners and it is his understanding that Dr. Tasia Catchings and the surgeon are in communication about this.

## 2021-02-25 NOTE — Telephone Encounter (Signed)
I see that this is a duplicate call from Bianca Taylor

## 2021-03-04 ENCOUNTER — Other Ambulatory Visit: Payer: Self-pay | Admitting: Internal Medicine

## 2021-03-05 NOTE — Telephone Encounter (Signed)
Please advise if ok to refill Historical Provider medication.

## 2021-03-19 ENCOUNTER — Inpatient Hospital Stay: Payer: Medicare Other | Attending: Oncology

## 2021-03-19 ENCOUNTER — Inpatient Hospital Stay (HOSPITAL_BASED_OUTPATIENT_CLINIC_OR_DEPARTMENT_OTHER): Payer: Medicare Other | Admitting: Oncology

## 2021-03-19 ENCOUNTER — Encounter: Payer: Self-pay | Admitting: Oncology

## 2021-03-19 VITALS — BP 118/78 | HR 71 | Temp 96.9°F

## 2021-03-19 DIAGNOSIS — Z1589 Genetic susceptibility to other disease: Secondary | ICD-10-CM

## 2021-03-19 DIAGNOSIS — I82412 Acute embolism and thrombosis of left femoral vein: Secondary | ICD-10-CM

## 2021-03-19 DIAGNOSIS — Z993 Dependence on wheelchair: Secondary | ICD-10-CM | POA: Insufficient documentation

## 2021-03-19 DIAGNOSIS — Z86000 Personal history of in-situ neoplasm of breast: Secondary | ICD-10-CM | POA: Insufficient documentation

## 2021-03-19 DIAGNOSIS — Z7901 Long term (current) use of anticoagulants: Secondary | ICD-10-CM | POA: Diagnosis not present

## 2021-03-19 DIAGNOSIS — D75839 Thrombocytosis, unspecified: Secondary | ICD-10-CM

## 2021-03-19 LAB — COMPREHENSIVE METABOLIC PANEL
ALT: 10 U/L (ref 0–44)
AST: 19 U/L (ref 15–41)
Albumin: 3.3 g/dL — ABNORMAL LOW (ref 3.5–5.0)
Alkaline Phosphatase: 65 U/L (ref 38–126)
Anion gap: 8 (ref 5–15)
BUN: 14 mg/dL (ref 8–23)
CO2: 27 mmol/L (ref 22–32)
Calcium: 8.8 mg/dL — ABNORMAL LOW (ref 8.9–10.3)
Chloride: 102 mmol/L (ref 98–111)
Creatinine, Ser: 0.79 mg/dL (ref 0.44–1.00)
GFR, Estimated: >60 mL/min (ref >60)
Glucose, Bld: 139 mg/dL — ABNORMAL HIGH (ref 70–99)
Potassium: 3.8 mmol/L (ref 3.5–5.1)
Sodium: 137 mmol/L (ref 135–145)
Total Bilirubin: 0.8 mg/dL (ref 0.3–1.2)
Total Protein: 6.2 g/dL — ABNORMAL LOW (ref 6.5–8.1)

## 2021-03-19 LAB — CBC WITH DIFFERENTIAL/PLATELET
Abs Immature Granulocytes: 0.03 10*3/uL (ref 0.00–0.07)
Basophils Absolute: 0.1 10*3/uL (ref 0.0–0.1)
Basophils Relative: 1 %
Eosinophils Absolute: 0.2 10*3/uL (ref 0.0–0.5)
Eosinophils Relative: 2 %
HCT: 42.1 % (ref 36.0–46.0)
Hemoglobin: 13.7 g/dL (ref 12.0–15.0)
Immature Granulocytes: 0 %
Lymphocytes Relative: 16 %
Lymphs Abs: 1.4 10*3/uL (ref 0.7–4.0)
MCH: 31.4 pg (ref 26.0–34.0)
MCHC: 32.5 g/dL (ref 30.0–36.0)
MCV: 96.3 fL (ref 80.0–100.0)
Monocytes Absolute: 0.7 10*3/uL (ref 0.1–1.0)
Monocytes Relative: 8 %
Neutro Abs: 6.3 10*3/uL (ref 1.7–7.7)
Neutrophils Relative %: 73 %
Platelets: 518 10*3/uL — ABNORMAL HIGH (ref 150–400)
RBC: 4.37 MIL/uL (ref 3.87–5.11)
RDW: 15 % (ref 11.5–15.5)
WBC: 8.6 10*3/uL (ref 4.0–10.5)
nRBC: 0 % (ref 0.0–0.2)

## 2021-03-19 MED ORDER — ELIQUIS 5 MG PO TABS: 5.0000 mg | ORAL_TABLET | Freq: Two times a day (BID) | ORAL | 1 refills | Status: DC

## 2021-03-19 NOTE — Progress Notes (Signed)
Hematology/Oncology follow up note [facility] Telephone:([phone] Fax:(336) [phone]   Patient Care Team: [name], MD as PCP - General (Internal Medicine)  REFERRING PROVIDER: [name], MD  CHIEF COMPLAINTS/REASON FOR VISIT:  Evaluation of history of left lower extremity DVT  HISTORY OF PRESENTING ILLNESS:   Bianca Taylor is a  87 y.o.  female with PMH listed below was seen in consultation at the request of  Tama High III, MD  for evaluation of history of left lower extremity DVT.   11/16/20 She presented to PCP's office for evaluation of left lower extremity swelling and pain.  She does not recall any injury, trauma, although family member reports possible fall history.   11/16/20 Left lower extremity US showed short segment occlusive DVT involving mid aspect of the left femoral vein. Patient was started on eliquis.   Patient was accompanied by her husband who is also my patient.  Patient's left lower extremity swelling has improved.  Given the unprovoked DVT and remote history of cancer, PCP recommends her to establish with hematology oncology to determine whether this would better be pursued as a PET scan or CT chest, abdomen, pelvis  Remote history of left breast DCIS, ER positivein 2013, she had lumpectomy by Dr.Byrnett and radiation by Dr.Chrystal. , and reports taking tamoxifen for a few years.  Denies any breast concern today.  Denies any pain.   INTERVAL HISTORY Bianca Taylor is a 87 y.o. female who has above history reviewed by me today presents for follow up visit for management of acute DVT Problems and complaints are listed below: Left lower extremity swelling has improved. She take eliquis 61m BID Patient was accompanied by her son. Patient is a poor historian. No reports from family of any bleeding events.  Review of Systems  Constitutional:  Negative for appetite change, chills, fatigue and fever.  HENT:   Negative  for hearing loss and voice change.   Eyes:  Negative for eye problems.  Respiratory:  Negative for chest tightness and cough.   Cardiovascular:  Negative for chest pain and leg swelling.  Gastrointestinal:  Negative for abdominal distention, abdominal pain and blood in stool.  Endocrine: Negative for hot flashes.  Genitourinary:  Negative for difficulty urinating and frequency.   Musculoskeletal:  Negative for arthralgias.  Skin:  Negative for itching and rash.  Neurological:  Negative for extremity weakness.  Hematological:  Negative for adenopathy.  Psychiatric/Behavioral:  Negative for confusion.        Memory loss   MEDICAL HISTORY:  Past Medical History:  Diagnosis Date   Breast cancer ([place] 2013   left breast   COPD (chronic obstructive pulmonary disease) ([place]    DVT [place]) [place] 10/[place]   LLE   GERD (gastroesophageal reflux disease)    Hypercholesterolemia    Hypertension    Ischemic cardiomyopathy    a. 01/2017 Echo: EF 45-50%, mod inflat and inf HK, Gr1 DD.   ST elevation myocardial infarction ([place]I) of inferior wall [place]    a. 01/2017 in setting of acute stroke-->medically managed.   Stroke ([facility]    a. 01/2017 MRI: 1 cm acute ischemic nonhemorrhagic lacunar type infarct involving the left lentiform nucleus. Mod cerebral atrophy, chronic microvascular isch dzs, multiple scattered remote infarcts, extensive chrnoic micro hemorrhages;  b. 01/2016 CTA head/neck: no acute IC/EC stenosis or dissection.    SURGICAL HISTORY: Past Surgical History:  Procedure Laterality Date   BRAIN SURGERY  BREAST BIOPSY Left    positive 03/2012   BREAST BIOPSY Bilateral    negative 2000   BREAST EXCISIONAL BIOPSY Left    positive 04/2012   BREAST LUMPECTOMY Left 2013   BREAST MAMMOSITE Left    CATARACT EXTRACTION     ENTROPIAN REPAIR Right 04/27/2018   Procedure: REPAIR OF ENTROPION, SUTURES AND EXTENSIVE;  Surgeon: Karle Starch, MD;  Location: [place];   Service: Ophthalmology;  Laterality: Right;    SOCIAL HISTORY: Social History   Socioeconomic History   Marital status: Married    Spouse name: Not on file   Number of children: Not on file   Years of education: Not on file   Highest education level: Not on file  Occupational History   Not on file  Tobacco Use   Smoking status: Former    Packs/day: 1.00    Years: 20.00    Pack years: 20.00    Types: Cigarettes    Quit date: 72    Years since quitting: 32.5   Smokeless tobacco: Never  Vaping Use   Vaping Use: Never used  Substance and Sexual Activity   Alcohol use: No   Drug use: Never   Sexual activity: Not on file  Other Topics Concern   Not on file  Social History Narrative   Not on file   Social Determinants of Health   Financial Resource Strain: Not on file  Food Insecurity: Not on file  Transportation Needs: Not on file  Physical Activity: Not on file  Stress: Not on file  Social Connections: Not on file  Intimate Partner Violence: Not on file    FAMILY HISTORY: Family History  Problem Relation Age of Onset   Heart attack Father     ALLERGIES:  is allergic to codeine, fluocinolone, moxifloxacin, pravastatin, risedronate, tetracyclines & related, and niacin.  MEDICATIONS:  Current Outpatient Medications  Medication Sig Dispense Refill   acetaminophen (TYLENOL) 325 MG tablet Take 2 tablets (650 mg total) by mouth every 6 (six) hours as needed for mild pain (or Fever >/= 101).     atorvastatin (LIPITOR) 40 MG tablet TAKE 2 TABLETS BY MOUTH DAILY 180 tablet 2   ELIQUIS 5 MG TABS tablet Take 1 tablet (5 mg total) by mouth in the morning and at bedtime. 60 tablet 1   furosemide (LASIX) 20 MG tablet Take 20 mg by mouth daily as needed.     metoprolol tartrate (LOPRESSOR) 25 MG tablet TAKE ONE TABLET BY MOUTH TWICE DAILY 180 tablet 0   pantoprazole (PROTONIX) 40 MG tablet Take 40 mg daily by mouth.     No current facility-administered medications for this  visit.     PHYSICAL EXAMINATION: ECOG PERFORMANCE STATUS: 2 - Symptomatic, <50% confined to bed Vitals:   03/19/21 1345  BP: 118/78  Pulse: 71  Temp: (!) 96.9 F (36.1 C)   There were no vitals filed for this visit.   Physical Exam Constitutional:      General: She is not in acute distress.    Comments: She sits in the wheelchair  HENT:     Head: Normocephalic and atraumatic.  Eyes:     General: No scleral icterus. Cardiovascular:     Rate and Rhythm: Normal rate and regular rhythm.     Heart sounds: Normal heart sounds.  Pulmonary:     Effort: Pulmonary effort is normal. No respiratory distress.     Breath sounds: No wheezing.  Abdominal:  General: Bowel sounds are normal. There is no distension.     Palpations: Abdomen is soft.  Musculoskeletal:        General: No deformity. Normal range of motion.     Cervical back: Normal range of motion and neck supple.     Comments:    Skin:    General: Skin is warm and dry.     Findings: No erythema or rash.  Neurological:     Mental Status: She is alert. Mental status is at baseline.     Cranial Nerves: No cranial nerve deficit.     Coordination: Coordination normal.  Psychiatric:        Mood and Affect: Mood normal.    LABORATORY DATA:  I have reviewed the data as listed Lab Results  Component Value Date   WBC 8.6 03/19/2021   HGB 13.7 03/19/2021   HCT 42.1 03/19/2021   MCV 96.3 03/19/2021   PLT 518 (H) 03/19/2021   Recent Labs    03/19/21 1329  NA 137  K 3.8  CL 102  CO2 27  GLUCOSE 139*  BUN 14  CREATININE 0.79  CALCIUM 8.8*  GFRNONAA >60  PROT 6.2*  ALBUMIN 3.3*  AST 19  ALT 10  ALKPHOS 65  BILITOT 0.8   Iron/TIBC/Ferritin/ %Sat No results found for: IRON, TIBC, FERRITIN, IRONPCTSAT    RADIOGRAPHIC STUDIES: I have personally reviewed the radiological images as listed and agreed with the findings in the report. MM 3D SCREEN BREAST BILATERAL  Result Date: 12/25/2020 CLINICAL DATA:   Screening. EXAM: DIGITAL SCREENING BILATERAL MAMMOGRAM WITH TOMOSYNTHESIS AND CAD TECHNIQUE: Bilateral screening digital craniocaudal and mediolateral oblique mammograms were obtained. Bilateral screening digital breast tomosynthesis was performed. The images were evaluated with computer-aided detection. COMPARISON:  Previous exam(s). ACR Breast Density Category b: There are scattered areas of fibroglandular density. FINDINGS: There are no findings suspicious for malignancy. The images were evaluated with computer-aided detection. IMPRESSION: No mammographic evidence of malignancy. A result letter of this screening mammogram will be mailed directly to the patient. RECOMMENDATION: Screening mammogram in one year. (Code:SM-B-01Y) BI-RADS CATEGORY  1: Negative. Electronically Signed   By: Kristopher Oppenheim M.D.   On: 12/25/2020 13:24       ASSESSMENT & PLAN:  1. Acute deep vein thrombosis (DVT) of femoral vein of left lower extremity (HCC)   2. History of ductal carcinoma in situ (DCIS) of breast   3. JAK-2 gene mutation   4. Thrombocytosis    # Recent history of acute lower extremity DVT Unprovoked.Negative factor V Leiden mutation, prothrombin gene mutation.  Antiphospholipid panel negative.  No lupus anticoagulant was found. Continue Eliquis 30m BID to finish 6 months anticoagultion, End of August  # Thrombocytosis, JAK2 positivity. 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  Cannot rule out polycythemia, myelofibrosis. Patient and family are not interested in bone marrow biopsy, or hydroxyurea treatment.  Continue monitor.  After she finishes 6 months of full dose Eliquis, consider switch to Eliquis 2.530mBID for maintenance.  Fall precaution was discussed.   Follow-up in 3 months No orders of the defined types were placed in this encounter.   All questions were answered. The patient knows to call the clinic with any problems questions or concerns.  cc KlAdin HectorMD    Thank you for  this kind referral and the opportunity to participate in the care of this patient. A copy of today's note is routed to referring provider    ZhEarlie ServerMD, PhD  Hematology [place] at [facility] Pager- [phone] 03/19/2021

## 2021-05-21 ENCOUNTER — Other Ambulatory Visit: Payer: Self-pay

## 2021-05-21 ENCOUNTER — Inpatient Hospital Stay: Payer: Medicare Other | Attending: Oncology

## 2021-05-21 ENCOUNTER — Inpatient Hospital Stay (HOSPITAL_BASED_OUTPATIENT_CLINIC_OR_DEPARTMENT_OTHER): Payer: Medicare Other | Admitting: Oncology

## 2021-05-21 ENCOUNTER — Encounter: Payer: Self-pay | Admitting: Oncology

## 2021-05-21 VITALS — BP 140/88 | HR 72 | Temp 97.1°F | Resp 18

## 2021-05-21 DIAGNOSIS — I82412 Acute embolism and thrombosis of left femoral vein: Secondary | ICD-10-CM | POA: Insufficient documentation

## 2021-05-21 DIAGNOSIS — Z86000 Personal history of in-situ neoplasm of breast: Secondary | ICD-10-CM

## 2021-05-21 DIAGNOSIS — Z86718 Personal history of other venous thrombosis and embolism: Secondary | ICD-10-CM

## 2021-05-21 DIAGNOSIS — D75839 Thrombocytosis, unspecified: Secondary | ICD-10-CM | POA: Diagnosis not present

## 2021-05-21 DIAGNOSIS — Z1589 Genetic susceptibility to other disease: Secondary | ICD-10-CM

## 2021-05-21 DIAGNOSIS — Z7901 Long term (current) use of anticoagulants: Secondary | ICD-10-CM | POA: Diagnosis not present

## 2021-05-21 DIAGNOSIS — Z993 Dependence on wheelchair: Secondary | ICD-10-CM | POA: Insufficient documentation

## 2021-05-21 LAB — CBC WITH DIFFERENTIAL/PLATELET
Abs Immature Granulocytes: 0.04 10*3/uL (ref 0.00–0.07)
Basophils Absolute: 0.1 10*3/uL (ref 0.0–0.1)
Basophils Relative: 1 %
Eosinophils Absolute: 0.3 10*3/uL (ref 0.0–0.5)
Eosinophils Relative: 3 %
HCT: 40.6 % (ref 36.0–46.0)
Hemoglobin: 13.2 g/dL (ref 12.0–15.0)
Immature Granulocytes: 0 %
Lymphocytes Relative: 21 %
Lymphs Abs: 2 10*3/uL (ref 0.7–4.0)
MCH: 31.5 pg (ref 26.0–34.0)
MCHC: 32.5 g/dL (ref 30.0–36.0)
MCV: 96.9 fL (ref 80.0–100.0)
Monocytes Absolute: 0.9 10*3/uL (ref 0.1–1.0)
Monocytes Relative: 9 %
Neutro Abs: 6.4 10*3/uL (ref 1.7–7.7)
Neutrophils Relative %: 66 %
Platelets: 527 10*3/uL — ABNORMAL HIGH (ref 150–400)
RBC: 4.19 MIL/uL (ref 3.87–5.11)
RDW: 15.4 % (ref 11.5–15.5)
WBC: 9.6 10*3/uL (ref 4.0–10.5)
nRBC: 0 % (ref 0.0–0.2)

## 2021-05-21 LAB — COMPREHENSIVE METABOLIC PANEL
ALT: 11 U/L (ref 0–44)
AST: 19 U/L (ref 15–41)
Albumin: 3.4 g/dL — ABNORMAL LOW (ref 3.5–5.0)
Alkaline Phosphatase: 59 U/L (ref 38–126)
Anion gap: 8 (ref 5–15)
BUN: 11 mg/dL (ref 8–23)
CO2: 30 mmol/L (ref 22–32)
Calcium: 9 mg/dL (ref 8.9–10.3)
Chloride: 101 mmol/L (ref 98–111)
Creatinine, Ser: 0.67 mg/dL (ref 0.44–1.00)
GFR, Estimated: >60 mL/min (ref >60)
Glucose, Bld: 109 mg/dL — ABNORMAL HIGH (ref 70–99)
Potassium: 3.7 mmol/L (ref 3.5–5.1)
Sodium: 139 mmol/L (ref 135–145)
Total Bilirubin: 0.7 mg/dL (ref 0.3–1.2)
Total Protein: 6.5 g/dL (ref 6.5–8.1)

## 2021-05-21 MED ORDER — APIXABAN 2.5 MG PO TABS: 2.5000 mg | ORAL_TABLET | Freq: Two times a day (BID) | ORAL | 3 refills | Status: DC

## 2021-05-21 NOTE — Progress Notes (Signed)
Pt here for follow up. No new concerns voiced.   

## 2021-05-21 NOTE — Progress Notes (Signed)
Hematology/Oncology follow up note [facility] Telephone:(336) [phone] Fax:(336) [phone]   Patient Care Team: [name], MD as PCP - General (Internal Medicine)  REFERRING PROVIDER: [name], MD  CHIEF COMPLAINTS/REASON FOR VISIT:  Follow-up for left lower extremity DVT and JAK2 positive essential thrombocythemia  HISTORY OF PRESENTING ILLNESS:   Bianca Taylor is a  87 y.o.  female with PMH listed below was seen in consultation at the request of  [name], MD  for evaluation of history of left lower extremity DVT.   11/16/20 She presented to PCP's office for evaluation of left lower extremity swelling and pain.  She does not recall any injury, trauma, although family member reports possible fall history.   11/16/20 Left lower extremity US showed short segment occlusive DVT involving mid aspect of the left femoral vein. Patient was started on eliquis.   Patient was accompanied by her husband who is also my patient.  Patient's left lower extremity swelling has improved.  Given the unprovoked DVT and remote history of cancer, PCP recommends her to establish with hematology oncology to determine whether this would better be pursued as a PET scan or CT chest, abdomen, pelvis  Remote history of left breast DCIS, ER positivein 2013, she had lumpectomy by Dr.Byrnett and radiation by Dr.Chrystal. , and reports taking tamoxifen for a few years.  Denies any breast concern today.  Denies any pain.   INTERVAL HISTORY Bianca Taylor is a 87 y.o. female who has above history reviewed by me today presents for follow up visit for history of left lower extremity DVT and essential with anemia Problems and complaints are listed below: Patient has been on Eliquis 5 mg twice daily.  She tolerates well.  No reports of bleeding events.  Accompanied by son.  Review of Systems  Constitutional:  Negative for appetite change, chills, fatigue and fever.  HENT:    Negative for hearing loss and voice change.   Eyes:  Negative for eye problems.  Respiratory:  Negative for chest tightness and cough.   Cardiovascular:  Negative for chest pain and leg swelling.  Gastrointestinal:  Negative for abdominal distention, abdominal pain and blood in stool.  Endocrine: Negative for hot flashes.  Genitourinary:  Negative for difficulty urinating and frequency.   Musculoskeletal:  Negative for arthralgias.  Skin:  Negative for itching and rash.  Neurological:  Negative for extremity weakness.  Hematological:  Negative for adenopathy.  Psychiatric/Behavioral:  Negative for confusion.        Memory loss   MEDICAL HISTORY:  Past Medical History:  Diagnosis Date   Breast cancer ([place]) 2013   left breast   COPD (chronic obstructive pulmonary disease) ([place])    DVT (deep venous thrombosis) ([place]) 10/2020   LLE   GERD (gastroesophageal reflux disease)    Hypercholesterolemia    Hypertension    Ischemic cardiomyopathy    a. 01/2017 Echo: EF 45-50%, mod inflat and inf HK, Gr1 DD.   ST elevation myocardial infarction (STEMI) of inferior wall ([place])    a. 01/2017 in setting of acute stroke-->medically managed.   Stroke [facility])    a. 01/2017 MRI: 1 cm acute ischemic nonhemorrhagic lacunar type infarct involving the left lentiform nucleus. Mod cerebral atrophy, chronic microvascular isch dzs, multiple scattered remote infarcts, extensive chrnoic micro hemorrhages;  b. 01/2016 CTA head/neck: no acute IC/EC stenosis or dissection.    SURGICAL HISTORY: Past Surgical History:  Procedure Laterality Date  BRAIN SURGERY     BREAST BIOPSY Left    positive 03/2012   BREAST BIOPSY Bilateral    negative 2000   BREAST EXCISIONAL BIOPSY Left    positive 04/2012   BREAST LUMPECTOMY Left 2013   BREAST MAMMOSITE Left    CATARACT EXTRACTION     ENTROPIAN REPAIR Right 04/27/2018   Procedure: REPAIR OF ENTROPION, SUTURES AND EXTENSIVE;  Surgeon: Karle Starch, MD;  Location: [place];  Service: Ophthalmology;  Laterality: Right;    SOCIAL HISTORY: Social History   Socioeconomic History   Marital status: Married    Spouse name: Not on file   Number of children: Not on file   Years of education: Not on file   Highest education level: Not on file  Occupational History   Not on file  Tobacco Use   Smoking status: Former    Packs/day: 1.00    Years: 20.00    Pack years: 20.00    Types: Cigarettes    Quit date: 26    Years since quitting: 32.6   Smokeless tobacco: Never  Vaping Use   Vaping Use: Never used  Substance and Sexual Activity   Alcohol use: No   Drug use: Never   Sexual activity: Not on file  Other Topics Concern   Not on file  Social History Narrative   Not on file   Social Determinants of Health   Financial Resource Strain: Not on file  Food Insecurity: Not on file  Transportation Needs: Not on file  Physical Activity: Not on file  Stress: Not on file  Social Connections: Not on file  Intimate Partner Violence: Not on file    FAMILY HISTORY: Family History  Problem Relation Age of Onset   Heart attack Father     ALLERGIES:  is allergic to codeine, fluocinolone, moxifloxacin, pravastatin, risedronate, tetracyclines & related, and niacin.  MEDICATIONS:  Current Outpatient Medications  Medication Sig Dispense Refill   apixaban (ELIQUIS) 2.5 MG TABS tablet Take 1 tablet (2.5 mg total) by mouth 2 (two) times daily. 60 tablet 3   atorvastatin (LIPITOR) 40 MG tablet TAKE 2 TABLETS BY MOUTH DAILY 180 tablet 2   furosemide (LASIX) 20 MG tablet Take 20 mg by mouth daily as needed.     metoprolol tartrate (LOPRESSOR) 25 MG tablet TAKE ONE TABLET BY MOUTH TWICE DAILY 180 tablet 0   pantoprazole (PROTONIX) 40 MG tablet Take 40 mg daily by mouth.     acetaminophen (TYLENOL) 325 MG tablet Take 2 tablets (650 mg total) by mouth every 6 (six) hours as needed for mild pain (or Fever >/= 101). (Patient not taking: Reported on  05/21/2021)     No current facility-administered medications for this visit.     PHYSICAL EXAMINATION: ECOG PERFORMANCE STATUS: 2 - Symptomatic, <50% confined to bed Vitals:   05/21/21 1354  BP: 140/88  Pulse: 72  Resp: 18  Temp: (!) 97.1 F (36.2 C)   Filed Weights     Physical Exam Constitutional:      General: She is not in acute distress.    Comments: She sits in the wheelchair  HENT:     Head: Normocephalic and atraumatic.  Eyes:     General: No scleral icterus. Cardiovascular:     Rate and Rhythm: Normal rate and regular rhythm.     Heart sounds: Normal heart sounds.  Pulmonary:     Effort: Pulmonary effort is normal. No respiratory distress.  Breath sounds: No wheezing.  Abdominal:     General: Bowel sounds are normal. There is no distension.     Palpations: Abdomen is soft.  Musculoskeletal:        General: No deformity. Normal range of motion.     Cervical back: Normal range of motion and neck supple.     Comments:    Skin:    General: Skin is warm and dry.     Findings: No erythema or rash.  Neurological:     Mental Status: She is alert. Mental status is at baseline.     Cranial Nerves: No cranial nerve deficit.     Coordination: Coordination normal.  Psychiatric:        Mood and Affect: Mood normal.    LABORATORY DATA:  I have reviewed the data as listed Lab Results  Component Value Date   WBC 9.6 05/21/2021   HGB 13.2 05/21/2021   HCT 40.6 05/21/2021   MCV 96.9 05/21/2021   PLT 527 (H) 05/21/2021   Recent Labs    03/19/21 1329 05/21/21 1327  NA 137 139  K 3.8 3.7  CL 102 101  CO2 27 30  GLUCOSE 139* 109*  BUN 14 11  CREATININE 0.79 0.67  CALCIUM 8.8* 9.0  GFRNONAA >60 >60  PROT 6.2* 6.5  ALBUMIN 3.3* 3.4*  AST 19 19  ALT 10 11  ALKPHOS 65 59  BILITOT 0.8 0.7    Iron/TIBC/Ferritin/ %Sat No results found for: IRON, TIBC, FERRITIN, IRONPCTSAT    RADIOGRAPHIC STUDIES: I have personally reviewed the radiological images  as listed and agreed with the findings in the report. No results found.     ASSESSMENT & PLAN:  1. History of DVT (deep vein thrombosis)   2. History of ductal carcinoma in situ (DCIS) of breast   3. JAK-2 gene mutation   4. Thrombocytosis    # Recent history of acute lower extremity DVT Unprovoked.Negative factor V Leiden mutation, prothrombin gene mutation.  Antiphospholipid panel negative.  No lupus anticoagulant was found. Patient has finished 6 months of therapeutic anticoagulation with Eliquis 5 mg twice daily. I recommend patient to be switched to Eliquis 2.5 mg twice daily.  Prescription sent to pharmacy.  Recommend long-term anticoagulation prophylaxis.  Fall precaution.  # Thrombocytosis, JAK2 positivity. 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  Cannot rule out polycythemia, myelofibrosis. Patient and family are not interested in bone marrow biopsy, or hydroxyurea treatment.  Continue monitor.   #History of DCIS patient has had normal bilateral screening mammogram this year. Follow-up in 6 months Orders Placed This Encounter  Procedures   CBC with Differential/Platelet    Standing Status:   Future    Standing Expiration Date:   05/21/2022   Comprehensive metabolic panel    Standing Status:   Future    Standing Expiration Date:   05/21/2022     All questions were answered. The patient knows to call the clinic with any problems questions or concerns.  cc [name], MD    Earlie Server, MD, PhD Hematology Oncology [place] at [facility]  05/21/2021

## 2021-06-04 ENCOUNTER — Other Ambulatory Visit: Payer: Self-pay | Admitting: Internal Medicine

## 2021-07-01 DIAGNOSIS — Z23 Encounter for immunization: Secondary | ICD-10-CM

## 2021-07-03 ENCOUNTER — Encounter: Payer: Self-pay | Admitting: Internal Medicine

## 2021-07-03 ENCOUNTER — Ambulatory Visit (INDEPENDENT_AMBULATORY_CARE_PROVIDER_SITE_OTHER): Payer: Medicare Other | Admitting: Internal Medicine

## 2021-07-03 ENCOUNTER — Other Ambulatory Visit: Payer: Self-pay

## 2021-07-03 VITALS — BP 116/70 | HR 91 | Ht 60.0 in | Wt 105.0 lb

## 2021-07-03 DIAGNOSIS — R630 Anorexia: Secondary | ICD-10-CM

## 2021-07-03 DIAGNOSIS — Z86718 Personal history of other venous thrombosis and embolism: Secondary | ICD-10-CM | POA: Diagnosis not present

## 2021-07-03 DIAGNOSIS — I2510 Atherosclerotic heart disease of native coronary artery without angina pectoris: Secondary | ICD-10-CM

## 2021-07-03 DIAGNOSIS — E785 Hyperlipidemia, unspecified: Secondary | ICD-10-CM | POA: Diagnosis not present

## 2021-07-03 DIAGNOSIS — I471 Supraventricular tachycardia: Secondary | ICD-10-CM

## 2021-07-03 DIAGNOSIS — I10 Essential (primary) hypertension: Secondary | ICD-10-CM

## 2021-07-03 DIAGNOSIS — I255 Ischemic cardiomyopathy: Secondary | ICD-10-CM

## 2021-07-03 NOTE — Patient Instructions (Signed)
Medication Instructions: Your physician recommends that you continue on your current medications as directed. Please refer to the Current Medication list given to you today.   *If you need a refill on your cardiac medications before your next appointment, please call your pharmacy*   Lab Work: NONE   If you have labs (blood work) drawn today and your tests are completely normal, you will receive your results only by: [place] (if you have MyChart) OR A paper copy in the mail If you have any lab test that is abnormal or we need to change your treatment, we will call you to review the results.   Testing/Procedures: NONE    Follow-Up: At [facility], you and your health needs are our priority.  As part of our continuing mission to provide you with exceptional heart care, we have created designated Provider Care Teams.  These Care Teams include your primary Cardiologist (physician) and Advanced Practice Providers (APPs -  Physician Assistants and Nurse Practitioners) who all work together to provide you with the care you need, when you need it.  We recommend signing up for the patient portal called "MyChart".  Sign up information is provided on this After Visit Summary.  MyChart is used to connect with patients for Virtual Visits (Telemedicine).  Patients are able to view lab/test results, encounter notes, upcoming appointments, etc.  Non-urgent messages can be sent to your provider as well.   To learn more about what you can do with MyChart, go to [email or web address].    Your next appointment:   6 month(s)  The format for your next appointment:   In Person   Provider:   You may see Nelva Bush, M.D. or one of the following Advanced Practice Providers on your designated Care Team:   Murray Hodgkins, NP Christell Faith, PA-C Marrianne Mood, PA-C Cadence Parnell, [place]

## 2021-07-03 NOTE — Progress Notes (Signed)
Follow-up Outpatient Visit Date: 07/03/2021  Primary Care Provider: [name], MD [facility] [place] [address]  Chief Complaint: Follow-up coronary artery disease  HPI:  Bianca Taylor is a 87 y.o. female with history of coronary artery disease with inferior STEMI in the setting of acute stroke in 01/2017 (MI managed medically), hypertension, hyperlipidemia, unprovoked DVT (10/2020) managed by Dr. Tasia Catchings, breast cancer, COPD, and GERD, who presents for follow-up of coronary artery disease.  I last saw her in April, at which time she was doing well from a heart standpoint.  She noted intermittent abdominal pain that seems to improve with voiding and moving her bowels.  She has subsequently seen Dr Caryl Comes for evaluation/management of poor appetite.  Today, [name] reports that she has been feeling fairly well without chest pain, shortness of breath, palpitations, lightheadedness, or edema.  However, she reports a persistently poor appetite over the last couple of weeks.  Her family is finding it hard to find foods that she likes, though when prompted to eat and offered some assistance, she occasionally will finish most of a meal.  She denies abdominal pain.  She has been compliant with her medications.  She has not had any bleeding, remaining on apixaban.  --------------------------------------------------------------------------------------------------  Past Medical History:  Diagnosis Date   Breast cancer ([facility]ianca Taylor) 2013   left breast   COPD (chronic obstructive pulmonary disease) (HCC)    DVT (deep venous thrombosis) ([place]) 10/2020   LLE   GERD (gastroesophageal reflux disease)    Hypercholesterolemia    Hypertension    Ischemic cardiomyopathy    a. 01/2017 Echo: EF 45-50%, mod inflat and inf HK, Gr1 DD.   ST elevation myocardial infarction (STEMI) of inferior wall ([place])    a. 01/2017 in setting of acute stroke-->medically managed.   Stroke [facility])    a.  01/2017 MRI: 1 cm acute ischemic nonhemorrhagic lacunar type infarct involving the left lentiform nucleus. Mod cerebral atrophy, chronic microvascular isch dzs, multiple scattered remote infarcts, extensive chrnoic micro hemorrhages;  b. 01/2016 CTA head/neck: no acute IC/EC stenosis or dissection.   Past Surgical History:  Procedure Laterality Date   BRAIN SURGERY     BREAST BIOPSY Left    positive 03/2012   BREAST BIOPSY Bilateral    negative 2000   BREAST EXCISIONAL BIOPSY Left    positive 04/2012   BREAST LUMPECTOMY Left 2013   BREAST MAMMOSITE Left    CATARACT EXTRACTION     ENTROPIAN REPAIR Right 04/27/2018   Procedure: REPAIR OF ENTROPION, SUTURES AND EXTENSIVE;  Surgeon: Karle Starch, MD;  Location: [facility];  Service: Ophthalmology;  Laterality: Right;    Current Meds  Medication Sig   acetaminophen (TYLENOL) 325 MG tablet Take 2 tablets (650 mg total) by mouth every 6 (six) hours as needed for mild pain (or Fever >/= 101).   apixaban (ELIQUIS) 2.5 MG TABS tablet Take 1 tablet (2.5 mg total) by mouth 2 (two) times daily.   atorvastatin (LIPITOR) 40 MG tablet TAKE 2 TABLETS BY MOUTH DAILY   furosemide (LASIX) 20 MG tablet Take 20 mg by mouth daily as needed.   metoprolol tartrate (LOPRESSOR) 25 MG tablet TAKE 1 TABLET BY MOUTH TWICE DAILY   pantoprazole (PROTONIX) 40 MG tablet Take 40 mg daily by mouth.    Allergies: Codeine, Fluocinolone, Moxifloxacin, Pravastatin, Risedronate, Tetracyclines & related, and Niacin  Social History   Tobacco Use   Smoking status: Former  Packs/day: 1.00    Years: 20.00    Pack years: 20.00    Types: Cigarettes    Quit date: 1    Years since quitting: 32.8   Smokeless tobacco: Never  Vaping Use   Vaping Use: Never used  Substance Use Topics   Alcohol use: No   Drug use: Never    Family History  Problem Relation Age of Onset   Heart attack Father     Review of Systems: A 12-system review of systems was performed  and was negative except as noted in the HPI.  --------------------------------------------------------------------------------------------------  Physical Exam: BP 116/70   Pulse 91   Ht 5' (1.524 m)   Wt 105 lb (47.6 kg)   SpO2 94%   BMI 20.51 kg/m   General:  NAD.  Accompanied by husband and son. Neck: No JVD or HJR. Lungs: Clear to auscultation bilaterally without wheezes or crackles. Heart: Regular rate and rhythm without murmurs, rubs, or gallops. Abdomen: Soft, nontender, nondistended. Extremities: No lower extremity edema.  EKG: Normal sinus rhythm with inferolateral T wave inversions and nonspecific ST changes.  No significant change since 01/03/2021  Lab Results  Component Value Date   WBC 9.6 05/21/2021   HGB 13.2 05/21/2021   HCT 40.6 05/21/2021   MCV 96.9 05/21/2021   PLT 527 (H) 05/21/2021    Lab Results  Component Value Date   NA 139 05/21/2021   K 3.7 05/21/2021   CL 101 05/21/2021   CO2 30 05/21/2021   BUN 11 05/21/2021   CREATININE 0.67 05/21/2021   GLUCOSE 109 (H) 05/21/2021   ALT 11 05/21/2021    Lab Results  Component Value Date   CHOL 171 02/19/2017   HDL 49 02/19/2017   [place] 99 02/19/2017   TRIG 114 02/19/2017   CHOLHDL 3.5 02/19/2017    --------------------------------------------------------------------------------------------------  ASSESSMENT AND PLAN: Coronary artery disease without angina: [name] has been asymptomatic.  Her EKG shows stable inferolateral ST/T changes.  Given her comorbidities and advanced age as well as absence of symptoms, we have agreed to defer further work-up.  Continue metoprolol and atorvastatin for secondary prevention as well as apixaban in lieu of aspirin in the setting of unprovoked DVT.  Hyperlipidemia: Lipids well controlled with LDL of 57 on last check in August by Dr. Caryl Comes.  Continue atorvastatin.  History of DVT: Apixaban has been decreased to 2.5 mg twice daily per Dr. Collie Siad instructions.   Continue ongoing management per Dr. Tasia Catchings and Dr. Caryl Comes.  Anorexia: Present for several weeks.  Discussed strategies to help improve intake.  I also stressed the importance of adequate hydration.  If anorexia persists, Ms. Guilford should follow-up with Dr. Caryl Comes for further evaluation and management.  Follow-up: Return to clinic in 6 months.  Nelva Bush, MD 07/04/2021 7:12 AM

## 2021-07-04 ENCOUNTER — Encounter: Payer: Self-pay | Admitting: Internal Medicine

## 2021-07-04 DIAGNOSIS — R630 Anorexia: Secondary | ICD-10-CM | POA: Insufficient documentation

## 2021-07-04 DIAGNOSIS — Z86718 Personal history of other venous thrombosis and embolism: Secondary | ICD-10-CM | POA: Insufficient documentation

## 2021-07-09 ENCOUNTER — Emergency Department: Payer: Medicare Other

## 2021-07-09 ENCOUNTER — Other Ambulatory Visit: Payer: Self-pay

## 2021-07-09 ENCOUNTER — Encounter: Payer: Self-pay | Admitting: Intensive Care

## 2021-07-09 ENCOUNTER — Inpatient Hospital Stay
Admission: EM | Admit: 2021-07-09 | Discharge: 2021-07-16 | DRG: 871 | Disposition: A | Discharge status: Hospice | Payer: Medicare Other | Attending: Internal Medicine | Admitting: Internal Medicine

## 2021-07-09 DIAGNOSIS — R531 Weakness: Secondary | ICD-10-CM

## 2021-07-09 DIAGNOSIS — Z20822 Contact with and (suspected) exposure to covid-19: Secondary | ICD-10-CM | POA: Diagnosis present

## 2021-07-09 DIAGNOSIS — E46 Unspecified protein-calorie malnutrition: Secondary | ICD-10-CM | POA: Diagnosis present

## 2021-07-09 DIAGNOSIS — I10 Essential (primary) hypertension: Secondary | ICD-10-CM | POA: Diagnosis present

## 2021-07-09 DIAGNOSIS — Z853 Personal history of malignant neoplasm of breast: Secondary | ICD-10-CM | POA: Diagnosis not present

## 2021-07-09 DIAGNOSIS — R630 Anorexia: Secondary | ICD-10-CM | POA: Diagnosis not present

## 2021-07-09 DIAGNOSIS — E860 Dehydration: Secondary | ICD-10-CM | POA: Diagnosis present

## 2021-07-09 DIAGNOSIS — Z79899 Other long term (current) drug therapy: Secondary | ICD-10-CM

## 2021-07-09 DIAGNOSIS — R627 Adult failure to thrive: Secondary | ICD-10-CM | POA: Diagnosis present

## 2021-07-09 DIAGNOSIS — Z6823 Body mass index (BMI) 23.0-23.9, adult: Secondary | ICD-10-CM

## 2021-07-09 DIAGNOSIS — Z66 Do not resuscitate: Secondary | ICD-10-CM | POA: Diagnosis present

## 2021-07-09 DIAGNOSIS — E7800 Pure hypercholesterolemia, unspecified: Secondary | ICD-10-CM | POA: Diagnosis present

## 2021-07-09 DIAGNOSIS — E43 Unspecified severe protein-calorie malnutrition: Secondary | ICD-10-CM | POA: Diagnosis present

## 2021-07-09 DIAGNOSIS — K2100 Gastro-esophageal reflux disease with esophagitis, without bleeding: Secondary | ICD-10-CM | POA: Diagnosis present

## 2021-07-09 DIAGNOSIS — Z515 Encounter for palliative care: Secondary | ICD-10-CM

## 2021-07-09 DIAGNOSIS — Z7982 Long term (current) use of aspirin: Secondary | ICD-10-CM | POA: Diagnosis not present

## 2021-07-09 DIAGNOSIS — Z86718 Personal history of other venous thrombosis and embolism: Secondary | ICD-10-CM

## 2021-07-09 DIAGNOSIS — Z8673 Personal history of transient ischemic attack (TIA), and cerebral infarction without residual deficits: Secondary | ICD-10-CM | POA: Diagnosis not present

## 2021-07-09 DIAGNOSIS — Z7189 Other specified counseling: Secondary | ICD-10-CM | POA: Diagnosis not present

## 2021-07-09 DIAGNOSIS — J159 Unspecified bacterial pneumonia: Secondary | ICD-10-CM | POA: Diagnosis present

## 2021-07-09 DIAGNOSIS — I252 Old myocardial infarction: Secondary | ICD-10-CM

## 2021-07-09 DIAGNOSIS — N3000 Acute cystitis without hematuria: Secondary | ICD-10-CM | POA: Diagnosis not present

## 2021-07-09 DIAGNOSIS — R911 Solitary pulmonary nodule: Secondary | ICD-10-CM | POA: Diagnosis present

## 2021-07-09 DIAGNOSIS — J440 Chronic obstructive pulmonary disease with acute lower respiratory infection: Secondary | ICD-10-CM | POA: Diagnosis present

## 2021-07-09 DIAGNOSIS — A419 Sepsis, unspecified organism: Secondary | ICD-10-CM | POA: Diagnosis present

## 2021-07-09 DIAGNOSIS — R64 Cachexia: Secondary | ICD-10-CM

## 2021-07-09 DIAGNOSIS — I2510 Atherosclerotic heart disease of native coronary artery without angina pectoris: Secondary | ICD-10-CM | POA: Diagnosis present

## 2021-07-09 DIAGNOSIS — G9341 Metabolic encephalopathy: Secondary | ICD-10-CM | POA: Diagnosis present

## 2021-07-09 DIAGNOSIS — R233 Spontaneous ecchymoses: Secondary | ICD-10-CM | POA: Diagnosis present

## 2021-07-09 DIAGNOSIS — R0902 Hypoxemia: Secondary | ICD-10-CM | POA: Diagnosis present

## 2021-07-09 DIAGNOSIS — Z87891 Personal history of nicotine dependence: Secondary | ICD-10-CM

## 2021-07-09 DIAGNOSIS — Z7901 Long term (current) use of anticoagulants: Secondary | ICD-10-CM | POA: Diagnosis not present

## 2021-07-09 DIAGNOSIS — I255 Ischemic cardiomyopathy: Secondary | ICD-10-CM | POA: Diagnosis present

## 2021-07-09 DIAGNOSIS — J189 Pneumonia, unspecified organism: Secondary | ICD-10-CM

## 2021-07-09 LAB — CBC WITH DIFFERENTIAL/PLATELET
Abs Immature Granulocytes: 0.06 10*3/uL (ref 0.00–0.07)
Basophils Absolute: 0 10*3/uL (ref 0.0–0.1)
Basophils Relative: 0 %
Eosinophils Absolute: 0 10*3/uL (ref 0.0–0.5)
Eosinophils Relative: 0 %
HCT: 46 % (ref 36.0–46.0)
Hemoglobin: 15.5 g/dL — ABNORMAL HIGH (ref 12.0–15.0)
Immature Granulocytes: 0 %
Lymphocytes Relative: 11 %
Lymphs Abs: 1.7 10*3/uL (ref 0.7–4.0)
MCH: 32.6 pg (ref 26.0–34.0)
MCHC: 33.7 g/dL (ref 30.0–36.0)
MCV: 96.8 fL (ref 80.0–100.0)
Monocytes Absolute: 1.3 10*3/uL — ABNORMAL HIGH (ref 0.1–1.0)
Monocytes Relative: 8 %
Neutro Abs: 12.3 10*3/uL — ABNORMAL HIGH (ref 1.7–7.7)
Neutrophils Relative %: 81 %
Platelets: 532 10*3/uL — ABNORMAL HIGH (ref 150–400)
RBC: 4.75 MIL/uL (ref 3.87–5.11)
RDW: 14.5 % (ref 11.5–15.5)
WBC: 15.4 10*3/uL — ABNORMAL HIGH (ref 4.0–10.5)
nRBC: 0 % (ref 0.0–0.2)

## 2021-07-09 LAB — BLOOD GAS, VENOUS
Acid-Base Excess: 10.7 mmol/L — ABNORMAL HIGH (ref 0.0–2.0)
Bicarbonate: 35.8 mmol/L — ABNORMAL HIGH (ref 20.0–28.0)
FIO2: 0.21
O2 Saturation: 86.9 %
Patient temperature: 37
pCO2, Ven: 47 mmHg (ref 44.0–60.0)
pH, Ven: 7.49 — ABNORMAL HIGH (ref 7.250–7.430)
pO2, Ven: 48 mmHg — ABNORMAL HIGH (ref 32.0–45.0)

## 2021-07-09 LAB — URINALYSIS, COMPLETE (UACMP) WITH MICROSCOPIC
Bilirubin Urine: NEGATIVE
Glucose, UA: NEGATIVE mg/dL
Hgb urine dipstick: NEGATIVE
Ketones, ur: 5 mg/dL — AB
Nitrite: POSITIVE — AB
Protein, ur: 30 mg/dL — AB
Specific Gravity, Urine: >1.046 — ABNORMAL HIGH (ref 1.005–1.030)
WBC, UA: >50 WBC/hpf — ABNORMAL HIGH (ref 0–5)
pH: 6 (ref 5.0–8.0)

## 2021-07-09 LAB — COMPREHENSIVE METABOLIC PANEL
ALT: 6 U/L (ref 0–44)
AST: 28 U/L (ref 15–41)
Albumin: 3.1 g/dL — ABNORMAL LOW (ref 3.5–5.0)
Alkaline Phosphatase: 62 U/L (ref 38–126)
Anion gap: 10 (ref 5–15)
BUN: 26 mg/dL — ABNORMAL HIGH (ref 8–23)
CO2: 26 mmol/L (ref 22–32)
Calcium: 8.8 mg/dL — ABNORMAL LOW (ref 8.9–10.3)
Chloride: 105 mmol/L (ref 98–111)
Creatinine, Ser: 0.9 mg/dL (ref 0.44–1.00)
GFR, Estimated: >60 mL/min (ref >60)
Glucose, Bld: 135 mg/dL — ABNORMAL HIGH (ref 70–99)
Potassium: 4.8 mmol/L (ref 3.5–5.1)
Sodium: 141 mmol/L (ref 135–145)
Total Bilirubin: 1.5 mg/dL — ABNORMAL HIGH (ref 0.3–1.2)
Total Protein: 6.6 g/dL (ref 6.5–8.1)

## 2021-07-09 LAB — LACTIC ACID, PLASMA
Lactic Acid, Venous: 1.9 mmol/L (ref 0.5–1.9)
Lactic Acid, Venous: 1.6 mmol/L (ref 0.5–1.9)

## 2021-07-09 LAB — RESP PANEL BY RT-PCR (FLU A&B, COVID) ARPGX2
Influenza A by PCR: NEGATIVE
Influenza B by PCR: NEGATIVE
SARS Coronavirus 2 by RT PCR: NEGATIVE

## 2021-07-09 LAB — CBG MONITORING, ED: Glucose-Capillary: 144 mg/dL — ABNORMAL HIGH (ref 70–99)

## 2021-07-09 LAB — TROPONIN I (HIGH SENSITIVITY)
Troponin I (High Sensitivity): 69 ng/L — ABNORMAL HIGH (ref <18)
Troponin I (High Sensitivity): 65 ng/L — ABNORMAL HIGH (ref <18)

## 2021-07-09 LAB — BRAIN NATRIURETIC PEPTIDE: B Natriuretic Peptide: 124.6 pg/mL — ABNORMAL HIGH (ref 0.0–100.0)

## 2021-07-09 LAB — LIPASE, BLOOD: Lipase: 33 U/L (ref 11–51)

## 2021-07-09 MED ORDER — SODIUM CHLORIDE 0.9 % IV SOLN: INTRAVENOUS | Status: AC

## 2021-07-09 MED ORDER — ENOXAPARIN SODIUM 60 MG/0.6ML IJ SOSY
1.0000 mg/kg | PREFILLED_SYRINGE | Freq: Two times a day (BID) | INTRAMUSCULAR | Status: DC
  Administered 2021-07-09 – 2021-07-11 (×4): 55 mg via SUBCUTANEOUS
  Filled 2021-07-09 (×4): qty 0.6

## 2021-07-09 MED ORDER — SODIUM CHLORIDE 0.9 % IV BOLUS (SEPSIS)
1000.0000 mL | Freq: Once | INTRAVENOUS | Status: AC
  Administered 2021-07-09: 1000 mL via INTRAVENOUS

## 2021-07-09 MED ORDER — SODIUM CHLORIDE 0.9 % IV SOLN: 2.0000 g | INTRAVENOUS | Status: DC

## 2021-07-09 MED ORDER — ATORVASTATIN CALCIUM 20 MG PO TABS
40.0000 mg | ORAL_TABLET | Freq: Every day | ORAL | Status: DC
  Administered 2021-07-10 – 2021-07-14 (×5): 40 mg via ORAL
  Filled 2021-07-09 (×5): qty 2

## 2021-07-09 MED ORDER — ACETAMINOPHEN 650 MG RE SUPP: 650.0000 mg | Freq: Four times a day (QID) | RECTAL | Status: DC | PRN

## 2021-07-09 MED ORDER — MELATONIN 5 MG PO TABS
5.0000 mg | ORAL_TABLET | Freq: Every day | ORAL | Status: DC
  Administered 2021-07-10 – 2021-07-11 (×2): 5 mg via ORAL
  Filled 2021-07-09 (×3): qty 1

## 2021-07-09 MED ORDER — PANTOPRAZOLE SODIUM 40 MG PO TBEC
40.0000 mg | DELAYED_RELEASE_TABLET | Freq: Every day | ORAL | Status: DC
  Filled 2021-07-09: qty 1

## 2021-07-09 MED ORDER — IRBESARTAN 150 MG PO TABS
300.0000 mg | ORAL_TABLET | Freq: Every day | ORAL | Status: DC
  Administered 2021-07-10 – 2021-07-12 (×3): 300 mg via ORAL
  Filled 2021-07-09 (×5): qty 2

## 2021-07-09 MED ORDER — APIXABAN 2.5 MG PO TABS
2.5000 mg | ORAL_TABLET | Freq: Two times a day (BID) | ORAL | Status: DC
  Filled 2021-07-09: qty 1

## 2021-07-09 MED ORDER — CEFTRIAXONE SODIUM 2 G IJ SOLR
2.0000 g | INTRAMUSCULAR | Status: AC
  Administered 2021-07-09 – 2021-07-13 (×5): 2 g via INTRAVENOUS
  Filled 2021-07-09: qty 2
  Filled 2021-07-09 (×4): qty 20

## 2021-07-09 MED ORDER — HYDROCODONE-ACETAMINOPHEN 5-325 MG PO TABS: 1.0000 | ORAL_TABLET | ORAL | Status: DC | PRN

## 2021-07-09 MED ORDER — ONDANSETRON HCL 4 MG PO TABS: 4.0000 mg | ORAL_TABLET | Freq: Four times a day (QID) | ORAL | Status: DC | PRN

## 2021-07-09 MED ORDER — METOPROLOL TARTRATE 25 MG PO TABS
12.5000 mg | ORAL_TABLET | Freq: Two times a day (BID) | ORAL | Status: DC
  Administered 2021-07-10 – 2021-07-13 (×8): 12.5 mg via ORAL
  Filled 2021-07-09 (×8): qty 1

## 2021-07-09 MED ORDER — PANTOPRAZOLE SODIUM 40 MG IV SOLR
40.0000 mg | Freq: Every day | INTRAVENOUS | Status: DC
  Administered 2021-07-09 – 2021-07-11 (×3): 40 mg via INTRAVENOUS
  Filled 2021-07-09 (×3): qty 40

## 2021-07-09 MED ORDER — ACETAMINOPHEN 325 MG PO TABS
650.0000 mg | ORAL_TABLET | Freq: Four times a day (QID) | ORAL | Status: DC | PRN
  Administered 2021-07-12: 650 mg via ORAL
  Filled 2021-07-09: qty 2

## 2021-07-09 MED ORDER — SENNOSIDES-DOCUSATE SODIUM 8.6-50 MG PO TABS: 1.0000 | ORAL_TABLET | Freq: Every evening | ORAL | Status: DC | PRN

## 2021-07-09 MED ORDER — SODIUM CHLORIDE 0.9 % IV SOLN: 1.0000 g | Freq: Once | INTRAVENOUS | Status: DC

## 2021-07-09 MED ORDER — LACTATED RINGERS IV SOLN: INTRAVENOUS | Status: DC

## 2021-07-09 MED ORDER — MORPHINE SULFATE (PF) 2 MG/ML IV SOLN
1.0000 mg | INTRAVENOUS | Status: DC | PRN
  Administered 2021-07-10: 1 mg via INTRAVENOUS
  Filled 2021-07-09: qty 1

## 2021-07-09 MED ORDER — SODIUM CHLORIDE 0.9 % IV BOLUS (SEPSIS)
250.0000 mL | Freq: Once | INTRAVENOUS | Status: AC
  Administered 2021-07-09: 250 mL via INTRAVENOUS

## 2021-07-09 MED ORDER — IOHEXOL 350 MG/ML SOLN
60.0000 mL | Freq: Once | INTRAVENOUS | Status: AC | PRN
  Administered 2021-07-09: 60 mL via INTRAVENOUS

## 2021-07-09 MED ORDER — SODIUM CHLORIDE 0.9 % IV SOLN
500.0000 mg | Freq: Once | INTRAVENOUS | Status: AC
  Administered 2021-07-09: 500 mg via INTRAVENOUS
  Filled 2021-07-09: qty 500

## 2021-07-09 MED ORDER — ONDANSETRON HCL 4 MG/2ML IJ SOLN
4.0000 mg | Freq: Four times a day (QID) | INTRAMUSCULAR | Status: DC | PRN
  Administered 2021-07-16: 03:00:00 4 mg via INTRAVENOUS
  Filled 2021-07-09: qty 2

## 2021-07-09 MED ORDER — ZOLPIDEM TARTRATE 5 MG PO TABS
5.0000 mg | ORAL_TABLET | Freq: Every evening | ORAL | Status: DC | PRN
  Administered 2021-07-10: 21:00:00 5 mg via ORAL
  Filled 2021-07-09: qty 1

## 2021-07-09 MED ORDER — SODIUM CHLORIDE 0.9 % IV SOLN: 500.0000 mg | INTRAVENOUS | Status: DC

## 2021-07-09 MED ORDER — ENOXAPARIN SODIUM 40 MG/0.4ML IJ SOSY: 40.0000 mg | PREFILLED_SYRINGE | INTRAMUSCULAR | Status: DC

## 2021-07-09 MED ORDER — SODIUM CHLORIDE 0.9 % IV BOLUS (SEPSIS)
500.0000 mL | Freq: Once | INTRAVENOUS | Status: AC
  Administered 2021-07-09: 500 mL via INTRAVENOUS

## 2021-07-09 MED ORDER — BISACODYL 5 MG PO TBEC: 5.0000 mg | DELAYED_RELEASE_TABLET | Freq: Every day | ORAL | Status: DC | PRN

## 2021-07-09 MED ORDER — METOPROLOL TARTRATE 5 MG/5ML IV SOLN
5.0000 mg | Freq: Four times a day (QID) | INTRAVENOUS | Status: DC | PRN
  Administered 2021-07-11: 5 mg via INTRAVENOUS
  Filled 2021-07-09: qty 5

## 2021-07-09 MED ORDER — SODIUM CHLORIDE 0.9 % IV BOLUS
1000.0000 mL | Freq: Once | INTRAVENOUS | Status: AC
  Administered 2021-07-09: 1000 mL via INTRAVENOUS

## 2021-07-09 NOTE — Sepsis Progress Note (Signed)
Elink tracking the code sepsis. 

## 2021-07-09 NOTE — Sepsis Progress Note (Signed)
Notified bedside nurse of need to administer antibiotics.  

## 2021-07-09 NOTE — ED Triage Notes (Signed)
Patient arrived from home by EMS for weakness. Reports has not eaten in three days.

## 2021-07-09 NOTE — ED Notes (Addendum)
MD Dena Billet) made aware of the patients poor oral intake & difficulty taking oral medications and recommended to keep the patient NPO. MD agreed - Pt NPO until speech eval and their recommendation.

## 2021-07-09 NOTE — Consult Note (Signed)
CODE SEPSIS - PHARMACY COMMUNICATION  **Broad Spectrum Antibiotics should be administered within 1 hour of Sepsis diagnosis**  Time Code Sepsis Called/Page Received: 1159  Antibiotics Ordered: azithromycin, ceftriaxone  Time of 1st antibiotic administration: [place], PharmD Pharmacy Resident  07/09/2021 12:03 PM

## 2021-07-09 NOTE — H&P (Addendum)
History and Physical    Bianca Taylor AYT:[id number] DOB: [birth year] DOA: 07/09/2021  PCP: [name], MD  Patient coming from: home  Cardiologist: Dr. Saunders Revel, just had an appointment on the week prior to admission  I have personally briefly reviewed patient's old medical records in [place].  Chief Complaint: generalized weakness; not eating and drinking; altered mental status  HPI: Bianca Taylor is a 87 y.o. female with medical history significant for CAD (had MI but no stents placed 2018), stroke without residual deficits (happened at same time as MI), DVT in left leg in the Spring 2022 now on Eliquis, who presents to the emergency department on 07/09/2021 with decreased eating/drinking and increased somnolence and increased generalized weakness.  Patient did not get up all day on the day before admission. A lady who helps tried to get her up but was unable to get her up. She did not eat or drink all day; she took a few sips then vomited on 07/09/21. She did have some intermittent abdominal pain that began on the day of admission, is located in the periumbilical area, radiates to the rest of the abdomen, is up to 9/10 at times and is characterized as achy. At baseline for a month or more she will eat a bite of food then say she does not want anymore. Rarely drinks much of anything. Primary care did some tests 1.5 weeks before admission but no known results that would explain her decreased oral intake. Symptoms are alleviated by nothing and exacerbated by nothing. Associated symptoms: Episode of vomiting. No choking with eating or feeling like the food does not go down; it is just that she does not want to eat. No diarrhea, constipation, or bloody stool. No fever, chills, chest pain, palpitations, shortness of breath, coughing, wheezing, dysuria, or hematuria. Patient has been losing weight. She uses a cane at baseline; she can go from couch to bathroom to chair and they are all within  a short distance. She has a rash on both lower legs with unknown onset. No leg swelling.   ED Course: Labs include WBCs 15.4, troponin 69. CT of the chest showed right upper lobe pneumonia and a right lower lobe nodule that is larger than it had been previously in 2018 and needs to be evaluated eventually for possible adenocarcinoma.  Vitals include afebrile, pulse 94-130, respiratory rate 25-37, O2 sat 91%.   Notes:  Husband at home is 81 yo. They live together without other family at home but family members do their shopping and bring them to appointments, etc.  History: She has had a colonoscopy within the last 10 years, no known issues.   Review of Systems: As per HPI otherwise all other systems reviewed and are unremarable. NEUROLOGICAL: No headache or new focal weakness. Does have generalized weakness. MUSCULOSKELETAL: no new joint pain or joint swelling.    Past Medical History:  Diagnosis Date   Breast cancer ([place]) 2013   left breast   COPD (chronic obstructive pulmonary disease) ([place])    DVT (deep venous thrombosis) ([place]) 10/2020   LLE   GERD (gastroesophageal reflux disease)    Hypercholesterolemia    Hypertension    Ischemic cardiomyopathy    a. 01/2017 Echo: EF 45-50%, mod inflat and inf HK, Gr1 DD.   ST elevation myocardial infarction (STEMI) of inferior wall ([place])    a. 01/2017 in setting of acute stroke-->medically managed.   Stroke [facility])    a. 01/2017 MRI: 1  cm acute ischemic nonhemorrhagic lacunar type infarct involving the left lentiform nucleus. Mod cerebral atrophy, chronic microvascular isch dzs, multiple scattered remote infarcts, extensive chrnoic micro hemorrhages;  b. 01/2016 CTA head/neck: no acute IC/EC stenosis or dissection.    Past Surgical History:  Procedure Laterality Date   BRAIN SURGERY     BREAST BIOPSY Left    positive 03/2012   BREAST BIOPSY Bilateral    negative 2000   BREAST EXCISIONAL BIOPSY Left    positive 04/2012   BREAST LUMPECTOMY  Left 2013   BREAST MAMMOSITE Left    CATARACT EXTRACTION     ENTROPIAN REPAIR Right 04/27/2018   Procedure: REPAIR OF ENTROPION, SUTURES AND EXTENSIVE;  Surgeon: Karle Starch, MD;  Location: [place];  Service: Ophthalmology;  Laterality: Right;    Social History  reports that she quit smoking about 32 years ago. Her smoking use included cigarettes. She has a 20.00 pack-year smoking history. She has never used smokeless tobacco. She reports that she does not drink alcohol and does not use drugs.  Allergies  Allergen Reactions   Codeine     Other reaction(s): Dizziness   Fluocinolone Nausea And Vomiting   Moxifloxacin     Other reaction(s): Vomiting   Pravastatin     Other reaction(s): Muscle Pain   Risedronate Other (See Comments)    Bone pain   Tetracyclines & Related Nausea And Vomiting   Niacin Rash    Family History  Problem Relation Age of Onset   Heart attack Father      Home Medications  Prior to Admission medications   Medication Sig Start Date End Date Taking? Authorizing Provider  acetaminophen (TYLENOL) 325 MG tablet Take 2 tablets (650 mg total) by mouth every 6 (six) hours as needed for mild pain (or Fever >/= 101). 02/20/17   Nicholes Mango, MD  apixaban (ELIQUIS) 2.5 MG TABS tablet Take 1 tablet (2.5 mg total) by mouth 2 (two) times daily. 05/21/21   Earlie Server, MD  atorvastatin (LIPITOR) 40 MG tablet TAKE 2 TABLETS BY MOUTH DAILY 03/05/21   End, Harrell Gave, MD  furosemide (LASIX) 20 MG tablet Take 20 mg by mouth daily as needed.    [provider]  metoprolol tartrate (LOPRESSOR) 25 MG tablet TAKE 1 TABLET BY MOUTH TWICE DAILY 06/04/21   End, Harrell Gave, MD  pantoprazole (PROTONIX) 40 MG tablet Take 40 mg daily by mouth.    [provider]    Physical Exam: Vitals:   07/09/21 0830 07/09/21 0900 07/09/21 0930 07/09/21 1000  BP: 127/76 129/82 135/89 124/82  Pulse: 94 (!) 122 66 (!) 126  Resp: (!) 29 (!) 33  (!) 32  Temp:       TempSrc:      SpO2: 91% 91% 92% 92%  Weight:      Height:        Constitutional: NAD, calm, comfortable, ill-appearing. Vitals:   07/09/21 0830 07/09/21 0900 07/09/21 0930 07/09/21 1000  BP: 127/76 129/82 135/89 124/82  Pulse: 94 (!) 122 66 (!) 126  Resp: (!) 29 (!) 33  (!) 32  Temp:      TempSrc:      SpO2: 91% 91% 92% 92%  Weight:      Height:       Eyes: Pupils equal and round, lids and conjunctivae without icterus or erythema. ENMT: Mucous membranes are dry. Posterior pharynx clear of any exudate or lesions. Nares patent without discharge or bleeding.  Normocephalic, atraumatic.  Normal  dentition.  Neck: normal, supple, no masses, trachea midline.  Thyroid nontender, no masses appreciated, no thyromegaly. Respiratory: clear to auscultation bilaterally. Chest wall movements are symmetric. No wheezing, no crackles.  No rhonchi.  Normal respiratory effort. No accessory muscle use.  Cardiovascular: Normal S1, S2. Irregular. Tachycardic. Murmur 2/6 systolic. Trace lower exteremity edema b ilaterally. No rubs / gallops. Pulses: Radial, PT, and DP pulses 2+ bilaterally. No carotid bruits.  Capillary refill less than 3 seconds in all extremities bilaterally.  GI: soft, non-distended, normal active bowel sounds. No hepatosplenomegaly. No rigidity, rebound, or guarding. Non-tender. No masses palpated. No CVA tenderness bilaterally. Musculoskeletal: no clubbing / cyanosis. No joint deformity upper and lower extremities. Good ROM, no contractures. Normal muscle tone.  No tenderness or deformity in the back bilaterally. Integument: no ulcers. No induration. Clean, dry, intact. Lower extremities: lower legs with peticial rash bilaterally, non-tender.  Poor skin turgor.  Neurologic: CN 2-12 grossly intact. Sensation grossly intact to light touch. DTR 2+ bilaterally.  Babinski: Toes downgoing bilaterally.  Strength 4/5 in all 4.  Intact rapid alternating movements bilaterally.  No pronator  drift. Psychiatric: Normal judgment and insight. Alert and oriented x 2; Does not know the month or year. Knows Month and day of birth but not year of birth; does not know age; per family patient has difficulty with time at baseline. Normal mood.  Normal and appropriate affect. Lymphatic: No cervical lymphadenopathy. No supraclavicular lymphadenopathy.   Labs on Admission: I have personally reviewed the following labs and imaging studies.  CBC: Recent Labs  Lab 07/09/21 0813  WBC 15.4*  NEUTROABS 12.3*  HGB 15.5*  HCT 46.0  MCV 96.8  PLT 532*    Basic Metabolic Panel: Recent Labs  Lab 07/09/21 0813  NA 141  K 4.8  CL 105  CO2 26  GLUCOSE 135*  BUN 26*  CREATININE 0.90  CALCIUM 8.8*    GFR: Estimated Creatinine Clearance: 30.4 mL/min (by C-G formula based on SCr of 0.9 mg/dL).  Liver Function Tests: Recent Labs  Lab 07/09/21 0813  AST 28  ALT 6  ALKPHOS 62  BILITOT 1.5*  PROT 6.6  ALBUMIN 3.1*    Urine analysis:    Component Value Date/Time   COLORURINE YELLOW (A) 06/14/2019 1315   APPEARANCEUR CLEAR (A) 06/14/2019 1315   APPEARANCEUR Cloudy 07/15/2012 1700   LABSPEC 1.018 06/14/2019 1315   LABSPEC 1.014 07/15/2012 1700   PHURINE 6.0 06/14/2019 1315   GLUCOSEU NEGATIVE 06/14/2019 1315   GLUCOSEU Negative 07/15/2012 1700   HGBUR SMALL (A) 06/14/2019 1315   BILIRUBINUR NEGATIVE 06/14/2019 1315   BILIRUBINUR Negative 07/15/2012 1700   KETONESUR NEGATIVE 06/14/2019 1315   PROTEINUR NEGATIVE 06/14/2019 1315   NITRITE NEGATIVE 06/14/2019 1315   LEUKOCYTESUR NEGATIVE 06/14/2019 1315   LEUKOCYTESUR Trace 07/15/2012 1700    Radiological Exams on Admission: CT Angio Chest PE W and/or Wo Contrast  Result Date: 07/09/2021 CLINICAL DATA:  PE suspected, low/intermediate prob, positive D-dimer Tachypneic tachycardic with low sats clear x-ray EXAM: CT ANGIOGRAPHY CHEST WITH CONTRAST TECHNIQUE: Multidetector CT imaging of the chest was performed using the  standard protocol during bolus administration of intravenous contrast. Multiplanar CT image reconstructions and MIPs were obtained to evaluate the vascular anatomy. CONTRAST:  82mL OMNIPAQUE IOHEXOL 350 MG/ML SOLN COMPARISON:  Same day radiograph, July 28, 2017. FINDINGS: Cardiovascular: Evaluation is limited secondary to respiratory motion. Satisfactory opacification of the pulmonary arteries to the segmental level. No evidence of pulmonary embolism. Mildly enlarged heart size. No  pericardial effusion. Three-vessel coronary artery atherosclerotic calcifications. Aortic valve calcifications. Mildly enlarged ascending thoracic aorta measuring approximately 4.1 by 4.0 cm. Mediastinum/Nodes: No axillary or mediastinal adenopathy. Visualized thyroid is unremarkable. Lungs/Pleura: Evaluation of fine parenchymal detail is limited secondary to respiratory motion. No pleural effusion or pneumothorax. Indistinct ground-glass opacity along the RIGHT lower lobe paramediastinal border is suboptimally assessed due to respiratory motion in this location. It is estimated to span 17 mm, previously 12 mm in 2018 (series 6, image 64). Scattered ground glass opacities of the RIGHT apex. Lingular atelectasis. Upper Abdomen: Visualized stomach is decompressed, limiting evaluation. Mildly prominent gastric walls. Musculoskeletal: No acute osseous abnormality. Review of the MIP images confirms the above findings. IMPRESSION: 1. No pulmonary embolism within the limitations of the exam. 2. There is a 17 mm ground-glass nodule in the RIGHT lower lobe which is suboptimally assessed due to respiratory motion. It appears to have increased in size since 2018 and may reflect indolent adenocarcinoma. When clinically appropriate and patient is better able to tolerate breath holds, recommend repeat chest CT for improved characterization. 3. Scattered ground-glass opacities in the RIGHT apex are nonspecific and may be infectious or inflammatory in  etiology 4. Subjectively prominent gastric walls within a decompressed stomach. Findings could be due to underlying gastritis in the appropriate clinical setting. 5. Mild ectasia of the ascending thoracic aorta to 4.1 cm with calcifications of the aortic valve. This can be seen in the setting of aortic stenosis. Recommend annual imaging followup by CTA or MRA. This recommendation follows 2010 ACCF/AHA/AATS/ACR/ASA/SCA/SCAI/SIR/STS/SVM Guidelines for the Diagnosis and Management of Patients with Thoracic Aortic Disease. Circulation. 2010; 121: D326-Z124. Aortic aneurysm NOS (ICD10-I71.9) Aortic Atherosclerosis (ICD10-I70.0). Electronically Signed   By: Valentino Saxon M.D.   On: 07/09/2021 11:19   DG Chest Portable 1 View  Result Date: 07/09/2021 CLINICAL DATA:  Weakness EXAM: PORTABLE CHEST 1 VIEW COMPARISON:  Chest radiograph 07/23/2011, chest CT 07/28/2017 FINDINGS: The cardiomediastinal silhouette is normal. There is calcified atherosclerotic plaque of the aortic arch. There is no focal consolidation or pulmonary edema. There is no pleural effusion or pneumothorax. There is no acute osseous abnormality. IMPRESSION: No radiographic evidence of acute cardiopulmonary process. Electronically Signed   By: Valetta Mole M.D.   On: 07/09/2021 09:03    EKG: Independently reviewed.  119 bpm.  Sinus tachycardia.  Slight ST versus J-point elevation in lead III.  Significant artifact noted.  EKG #2: 139 bpm.  Sinus tachycardia with irregular rhythm.  Slight ST elevation/J-point elevation in lead III.  Old anterior infarct.   Echocardiogram, 12/06/2018: IMPRESSIONS   1. The left ventricle has low normal systolic function, with an ejection fraction of 50-55%. The cavity size was normal. There is mild concentric left ventricular hypertrophy. Left ventricular diastolic Doppler parameters are consistent with impaired  relaxation.   2. The right ventricle has normal systolic function. The cavity was  normal.  There is no increase in right ventricular wall thickness.   3. The mitral valve is grossly normal. There is mild mitral annular  calcification present.   4. The tricuspid valve is grossly normal.   5. The aortic valve is grossly normal Moderate thickening of the aortic valve Mild calcification of the aortic valve. Aortic valve regurgitation was not assessed by color flow Doppler.      Assessment/Plan  Principal Problem:    Sepsis due to undetermined organism [facility]) Present on admission. Source: pneumonia or UTI.  Plan:  Sepsis order set. Cultures ordered. IV antibiotics. IV fluids  to provide volume: was given initial IVF 30 mL/kg x 1, then maintenance IVF.   Active Problems:    Pneumonia, bacterial Plan: Cultures ordered.  Empiric IV antibiotics for suspected community-acquired pneumonia.    Acute cystitis without hematuria Did report some periumbilical abdominal pain, which could be related to acute cystitis.  Plan: Cultures ordered.  Empiric IV antibiotic.    Acute metabolic encephalopathy Increased somnolence.  Generalized weakness and fatigue.  Likely due to infection and anorexia. Plan: N.p.o. until patient passes nursing bedside swallow evaluation.  Neurochecks.    Coronary artery disease involving native coronary artery of native heart without angina pectoris No stents but did have an MI in 2018.  No acute chest pain.  Plan: Continue home medications and outpatient follow-up.    Anorexia and    Protein calorie malnutrition (HCC) Minimal oral intake x1 month. Plan: Nursing bedside swallow evaluation.  Will order speech therapy evaluation and nutrition consult.    Hypoxia Likely due to pneumonia.  Plan: Oxygen by nasal cannula as needed.    History of deep vein thrombosis (DVT) of lower extremity and    Long term current use of anticoagulant DVT in left lower leg diagnosed 01/01/2021.  Plan: Continue home Eliquis.  Confirmed with family that patient does in fact take  Eliquis.    Petechial rash of both lower legs Noted.  Plan: Monitor rash and CBC.  If patient has worsening, then consider holding Eliquis and evaluating further.    GERD without esophagitis Plan: Pantoprazole.    Troponin above reference range No chest pain.  Could be due to acute illness.  Second troponin slightly decreased. Plan: Telemetry.  If patient develops chest pain, then obtain troponin levels and evaluate further.    Right lower lobe pulmonary nodule CT notes "There is a 17 mm ground-glass nodule in the RIGHT lower lobe which is suboptimally assessed due to respiratory motion. It appears to have increased in size since 2018 and may reflect indolent adenocarcinoma. When clinically appropriate and patient is better able to tolerate breath holds, recommend repeat chest CT for improved characterization." Plan: Patient and son and daughter-in-law were informed of the findings of the enlarged pulmonary nodule, and that it could reflect a malignancy.  Informed son and daughter-in-law that patient would need outpatient follow-up with primary care and then a referral for further evaluation.     DVT prophylaxis: Eliquis.  Code Status:   DNR Patient presented with a portable [employer] DNR form (copy on phone).  Discussed code status options at length with the family. They elect to have the patient continue to be DNR.   Family Communication:   Daughter in Sports coach at bedside, Mariea Mcmartin [phone] and  Dayle Sherpa, son, [phone]    Disposition Plan:   Patient is from:  Home  Anticipated DC to:  Home  Anticipated DC date:  07/13/21  Anticipated DC barriers: Antibiotics  Consults called:  None  Admission status:  Inpatient   Severity of Illness: The appropriate patient status for this patient is INPATIENT. Inpatient status is judged to be reasonable and necessary in order to provide the required intensity of service to ensure the patient's safety. The patient's presenting  symptoms, physical exam findings, and initial radiographic and laboratory data in the context of their chronic comorbidities is felt to place them at high risk for further clinical deterioration. Furthermore, it is not anticipated that the patient will be medically stable for discharge from the hospital within 2 midnights of admission.  Findings include patient with SIRS criteria, pneumonia on CT of the chest, probable urinary tract infection, tachycardia despite IV fluids, metabolic encephalopathy.  She is requiring sepsis treatment, IV fluids, IV antibiotics.  * I certify that at the point of admission it is my clinical judgment that the patient will require inpatient hospital care spanning beyond 2 midnights from the point of admission due to high intensity of service, high risk for further deterioration and high frequency of surveillance required.Tacey Ruiz MD [facility]  How to contact the [facility] Attending or Consulting provider [place] or covering provider during after hours [place], for this patient?   Check the care team in [facility] and look for a) attending/consulting [facility] provider listed and b) the [facility] team listed Log into [email or web address] and use [place]'s universal password to access. If you do not have the password, please contact the hospital operator. Locate the [facility] provider you are looking for under [facility] and page to a number that you can be directly reached. If you still have difficulty reaching the provider, please page the [facility] (Director on Call) for the Hospitalists listed on amion for assistance.  07/09/2021, 12:25 PM

## 2021-07-09 NOTE — ED Provider Notes (Addendum)
[facility]  ____________________________________________   Event Date/Time   First MD Initiated Contact with Patient 07/09/21 [phone]     (approximate)  I have reviewed the triage vital signs and the nursing notes.   HISTORY  Chief Complaint Weakness   HPI Bianca Taylor is a 87 y.o. female who comes from home with below past medical history.  Patient has not been eating very much for several weeks at least.  She has not had anything to eat or drink for the last 3 days at the very least.  She is now awake alert but tachypneic and tachycardic.  She says she is not short of breath and does not have anything hurting her.  She follows commands easily but is very tired and possibly not quite herself as far as mental status goes.  She reports she is not having any pain swallowing but her son says last few times he saw her swallow several days ago she was acting like she was swallowing nails.         Past Medical History:  Diagnosis Date   Breast cancer ([place]) 2013   left breast   COPD (chronic obstructive pulmonary disease) ([place])    DVT (deep venous thrombosis) ([place]) 10/2020   LLE   GERD (gastroesophageal reflux disease)    Hypercholesterolemia    Hypertension    Ischemic cardiomyopathy    a. 01/2017 Echo: EF 45-50%, mod inflat and inf HK, Gr1 DD.   ST elevation myocardial infarction (STEMI) of inferior wall ([place])    a. 01/2017 in setting of acute stroke-->medically managed.   Stroke [facility])    a. 01/2017 MRI: 1 cm acute ischemic nonhemorrhagic lacunar type infarct involving the left lentiform nucleus. Mod cerebral atrophy, chronic microvascular isch dzs, multiple scattered remote infarcts, extensive chrnoic micro hemorrhages;  b. 01/2016 CTA head/neck: no acute IC/EC stenosis or dissection.    Patient Active Problem List   Diagnosis Date Noted   History of DVT (deep vein thrombosis) 07/04/2021   Anorexia 07/04/2021   PSVT (paroxysmal supraventricular  tachycardia) ([place]) 01/04/2021   JAK-2 gene mutation 01/01/2021   History of ductal carcinoma in situ (DCIS) of breast 01/01/2021   Acute deep vein thrombosis (DVT) of femoral vein of left lower extremity ([place]) 01/01/2021   Cough 11/06/2018   PAC (premature atrial contraction) 11/06/2018   Ischemic cardiomyopathy 11/06/2018   Fibromyalgia 06/25/2017   Essential hypertension 06/25/2017   GERD (gastroesophageal reflux disease) 06/25/2017   History of stroke 06/10/2017   Hyperlipidemia LDL goal <70 06/10/2017   Coronary artery disease involving native coronary artery of native heart without angina pectoris 03/11/2017    Past Surgical History:  Procedure Laterality Date   BRAIN SURGERY     BREAST BIOPSY Left    positive 03/2012   BREAST BIOPSY Bilateral    negative 2000   BREAST EXCISIONAL BIOPSY Left    positive 04/2012   BREAST LUMPECTOMY Left 2013   BREAST MAMMOSITE Left    CATARACT EXTRACTION     ENTROPIAN REPAIR Right 04/27/2018   Procedure: REPAIR OF ENTROPION, SUTURES AND EXTENSIVE;  Surgeon: Karle Starch, MD;  Location: [place];  Service: Ophthalmology;  Laterality: Right;    Prior to Admission medications   Medication Sig Start Date End Date Taking? Authorizing Provider  acetaminophen (TYLENOL) 325 MG tablet Take 2 tablets (650 mg total) by mouth every 6 (six) hours as needed for mild pain (or Fever >/= 101). 02/20/17   Gouru, Illene Silver,  MD  apixaban (ELIQUIS) 2.5 MG TABS tablet Take 1 tablet (2.5 mg total) by mouth 2 (two) times daily. 05/21/21   Earlie Server, MD  atorvastatin (LIPITOR) 40 MG tablet TAKE 2 TABLETS BY MOUTH DAILY 03/05/21   End, Harrell Gave, MD  furosemide (LASIX) 20 MG tablet Take 20 mg by mouth daily as needed.    [provider]  metoprolol tartrate (LOPRESSOR) 25 MG tablet TAKE 1 TABLET BY MOUTH TWICE DAILY 06/04/21   End, Harrell Gave, MD  pantoprazole (PROTONIX) 40 MG tablet Take 40 mg daily by mouth.    [provider]     Allergies Codeine, Fluocinolone, Moxifloxacin, Pravastatin, Risedronate, Tetracyclines & related, and Niacin  Family History  Problem Relation Age of Onset   Heart attack Father     Social History Social History   Tobacco Use   Smoking status: Former    Packs/day: 1.00    Years: 20.00    Pack years: 20.00    Types: Cigarettes    Quit date: 1990    Years since quitting: 32.8   Smokeless tobacco: Never  Vaping Use   Vaping Use: Never used  Substance Use Topics   Alcohol use: No   Drug use: Never    Review of Systems  Constitutional: No fever/chills Eyes: No visual changes. ENT: No sore throat. Cardiovascular: Denies chest pain. Respiratory: Denies shortness of breath. Gastrointestinal: No abdominal pain.  No nausea, no vomiting.  No diarrhea.  No constipation. Genitourinary: Negative for dysuria. Musculoskeletal: Negative for back pain. Skin: Negative for rash. Neurological: Negative for headaches, focal weakness   ____________________________________________   PHYSICAL EXAM:  VITAL SIGNS: ED Triage Vitals  Enc Vitals Group     BP 07/09/21 0807 127/64     Pulse Rate 07/09/21 0807 (!) 129     Resp 07/09/21 0807 (!) 30     Temp 07/09/21 0807 97.7 F (36.5 C)     Temp Source 07/09/21 0807 Oral     SpO2 07/09/21 0807 92 %     Weight 07/09/21 0802 120 lb (54.4 kg)     Height 07/09/21 0802 5' (1.524 m)     Head Circumference --      Peak Flow --      Pain Score --      Pain Loc --      Pain Edu? --      Excl. in [place]? --     Constitutional: Alert and oriented.  Ill and weak Eyes: Conjunctivae are normal.  Head: Atraumatic. Nose: No congestion/rhinnorhea. Mouth/Throat: Mucous membranes are moist.  Oropharynx non-erythematous. Neck: No stridor.   Cardiovascular: Rapid rate, regular rhythm. Grossly normal heart sounds.  Good peripheral circulation. Respiratory: Normal respiratory effort.  No retractions. Lungs CTAB. Gastrointestinal: Soft and  nontender. No distention. No abdominal bruits.  Musculoskeletal: No lower extremity tenderness nor edema.   Neurologic:  Normal speech and language. No gross focal neurologic deficits are appreciated. No gait instability. Skin:  Skin is warm, dry and intact.  Patient has a red petechial looking rash on both legs from the knees down.  Son reports this is new.   ____________________________________________   LABS (all labs ordered are listed, but only abnormal results are displayed)  Labs Reviewed  COMPREHENSIVE METABOLIC PANEL - Abnormal; Notable for the following components:      Result Value   Glucose, Bld 135 (*)    BUN 26 (*)    Calcium 8.8 (*)    Albumin 3.1 (*)  Total Bilirubin 1.5 (*)    All other components within normal limits  BRAIN NATRIURETIC PEPTIDE - Abnormal; Notable for the following components:   B Natriuretic Peptide 124.6 (*)    All other components within normal limits  CBC WITH DIFFERENTIAL/PLATELET - Abnormal; Notable for the following components:   WBC 15.4 (*)    Hemoglobin 15.5 (*)    Platelets 532 (*)    Neutro Abs 12.3 (*)    Monocytes Absolute 1.3 (*)    All other components within normal limits  BLOOD GAS, VENOUS - Abnormal; Notable for the following components:   pH, Ven 7.49 (*)    pO2, Ven 48.0 (*)    Bicarbonate 35.8 (*)    Acid-Base Excess 10.7 (*)    All other components within normal limits  CBG MONITORING, ED - Abnormal; Notable for the following components:   Glucose-Capillary 144 (*)    All other components within normal limits  TROPONIN I (HIGH SENSITIVITY) - Abnormal; Notable for the following components:   Troponin I (High Sensitivity) 69 (*)    All other components within normal limits  TROPONIN I (HIGH SENSITIVITY) - Abnormal; Notable for the following components:   Troponin I (High Sensitivity) 65 (*)    All other components within normal limits  RESP PANEL BY RT-PCR (FLU A&B, COVID) ARPGX2  CULTURE, BLOOD (ROUTINE X 2)   CULTURE, BLOOD (ROUTINE X 2)  LIPASE, BLOOD  LACTIC ACID, PLASMA  LACTIC ACID, PLASMA  URINALYSIS, COMPLETE (UACMP) WITH MICROSCOPIC   ____________________________________________  EKG  EKG read and interpreted by me appears to be sinus tachycardia at a rate of 119 very irregular no acute ST-T changes are seen ____________________________________________  RADIOLOGY Gertha Calkin, personally viewed and evaluated these images (plain radiographs) as part of my medical decision making, as well as reviewing the written report by the radiologist.  ED MD interpretation:    Official radiology report(s): CT Angio Chest PE W and/or Wo Contrast  Result Date: 07/09/2021 CLINICAL DATA:  PE suspected, low/intermediate prob, positive D-dimer Tachypneic tachycardic with low sats clear x-ray EXAM: CT ANGIOGRAPHY CHEST WITH CONTRAST TECHNIQUE: Multidetector CT imaging of the chest was performed using the standard protocol during bolus administration of intravenous contrast. Multiplanar CT image reconstructions and MIPs were obtained to evaluate the vascular anatomy. CONTRAST:  51mL OMNIPAQUE IOHEXOL 350 MG/ML SOLN COMPARISON:  Same day radiograph, July 28, 2017. FINDINGS: Cardiovascular: Evaluation is limited secondary to respiratory motion. Satisfactory opacification of the pulmonary arteries to the segmental level. No evidence of pulmonary embolism. Mildly enlarged heart size. No pericardial effusion. Three-vessel coronary artery atherosclerotic calcifications. Aortic valve calcifications. Mildly enlarged ascending thoracic aorta measuring approximately 4.1 by 4.0 cm. Mediastinum/Nodes: No axillary or mediastinal adenopathy. Visualized thyroid is unremarkable. Lungs/Pleura: Evaluation of fine parenchymal detail is limited secondary to respiratory motion. No pleural effusion or pneumothorax. Indistinct ground-glass opacity along the RIGHT lower lobe paramediastinal border is suboptimally assessed due  to respiratory motion in this location. It is estimated to span 17 mm, previously 12 mm in 2018 (series 6, image 64). Scattered ground glass opacities of the RIGHT apex. Lingular atelectasis. Upper Abdomen: Visualized stomach is decompressed, limiting evaluation. Mildly prominent gastric walls. Musculoskeletal: No acute osseous abnormality. Review of the MIP images confirms the above findings. IMPRESSION: 1. No pulmonary embolism within the limitations of the exam. 2. There is a 17 mm ground-glass nodule in the RIGHT lower lobe which is suboptimally assessed due to respiratory motion. It appears to have  increased in size since 2018 and may reflect indolent adenocarcinoma. When clinically appropriate and patient is better able to tolerate breath holds, recommend repeat chest CT for improved characterization. 3. Scattered ground-glass opacities in the RIGHT apex are nonspecific and may be infectious or inflammatory in etiology 4. Subjectively prominent gastric walls within a decompressed stomach. Findings could be due to underlying gastritis in the appropriate clinical setting. 5. Mild ectasia of the ascending thoracic aorta to 4.1 cm with calcifications of the aortic valve. This can be seen in the setting of aortic stenosis. Recommend annual imaging followup by CTA or MRA. This recommendation follows 2010 ACCF/AHA/AATS/ACR/ASA/SCA/SCAI/SIR/STS/SVM Guidelines for the Diagnosis and Management of Patients with Thoracic Aortic Disease. Circulation. 2010; 121: Y099-I338. Aortic aneurysm NOS (ICD10-I71.9) Aortic Atherosclerosis (ICD10-I70.0). Electronically Signed   By: Valentino Saxon M.D.   On: 07/09/2021 11:19   DG Chest Portable 1 View  Result Date: 07/09/2021 CLINICAL DATA:  Weakness EXAM: PORTABLE CHEST 1 VIEW COMPARISON:  Chest radiograph 07/23/2011, chest CT 07/28/2017 FINDINGS: The cardiomediastinal silhouette is normal. There is calcified atherosclerotic plaque of the aortic arch. There is no focal  consolidation or pulmonary edema. There is no pleural effusion or pneumothorax. There is no acute osseous abnormality. IMPRESSION: No radiographic evidence of acute cardiopulmonary process. Electronically Signed   By: Valetta Mole M.D.   On: 07/09/2021 09:03    ____________________________________________   PROCEDURES  Procedure(s) performed (including Critical Care): Critical care time half an hour this includes reviewing the patient's old records both from clinic and in the hospital.  I l reviewed for old EKGs and old chest x-ray successfully.  Additionally I obtained a lot of history from the patient's son.  I will have to discuss the patient with the hospitalist still.  Procedures   ____________________________________________   INITIAL IMPRESSION / ASSESSMENT AND PLAN / ED COURSE  As part of my medical decision making, I reviewed the following data within the [place] see above note for critical care about the medical decision making.  Obviously I reviewed the labs that I ordered.  Review of the chest x-ray and EKG as documented above as well Patient is very weak and not eating.  Not eating will have to be evaluated further.  She has what appears to be a pneumonia visible on the CT.  This will go along with her lower than expected O2 sats tachypnea and tachycardia.  Knowing this I will order sepsis.  She does not have a PE.  Her and admission will have to be further evaluated as I think it predates the apparent pneumonia.  Possibly we can get a CT of her belly later.           ____________________________________________   FINAL CLINICAL IMPRESSION(S) / ED DIAGNOSES  Final diagnoses:  Weakness  Inanition ([place])  Community acquired pneumonia, unspecified laterality     ED Discharge Orders     None        Note:  This document was prepared using Dragon voice recognition software and may include unintentional dictation errors.    Nena Polio, MD 07/09/21 1141    Nena Polio, MD 07/09/21 [phone]

## 2021-07-09 NOTE — ED Notes (Signed)
Dorian RN aware of assigned bed

## 2021-07-10 DIAGNOSIS — A419 Sepsis, unspecified organism: Secondary | ICD-10-CM | POA: Diagnosis not present

## 2021-07-10 LAB — BLOOD CULTURE ID PANEL (REFLEXED) - BCID2

## 2021-07-10 LAB — BASIC METABOLIC PANEL
Anion gap: 5 (ref 5–15)
BUN: 16 mg/dL (ref 8–23)
CO2: 22 mmol/L (ref 22–32)
Calcium: 7.5 mg/dL — ABNORMAL LOW (ref 8.9–10.3)
Chloride: 118 mmol/L — ABNORMAL HIGH (ref 98–111)
Creatinine, Ser: 0.63 mg/dL (ref 0.44–1.00)
GFR, Estimated: >60 mL/min (ref >60)
Glucose, Bld: 94 mg/dL (ref 70–99)
Potassium: 3.6 mmol/L (ref 3.5–5.1)
Sodium: 145 mmol/L (ref 135–145)

## 2021-07-10 LAB — CBC
HCT: 39.1 % (ref 36.0–46.0)
Hemoglobin: 12.7 g/dL (ref 12.0–15.0)
MCH: 32.2 pg (ref 26.0–34.0)
MCHC: 32.5 g/dL (ref 30.0–36.0)
MCV: 99.2 fL (ref 80.0–100.0)
Platelets: 479 10*3/uL — ABNORMAL HIGH (ref 150–400)
RBC: 3.94 MIL/uL (ref 3.87–5.11)
RDW: 14.6 % (ref 11.5–15.5)
WBC: 14.7 10*3/uL — ABNORMAL HIGH (ref 4.0–10.5)
nRBC: 0 % (ref 0.0–0.2)

## 2021-07-10 LAB — URINE CULTURE: Special Requests: NORMAL

## 2021-07-10 MED ORDER — SODIUM CHLORIDE 0.9 % IV BOLUS
1000.0000 mL | Freq: Once | INTRAVENOUS | Status: AC
  Administered 2021-07-10: 1000 mL via INTRAVENOUS

## 2021-07-10 MED ORDER — SODIUM CHLORIDE 0.9 % IV SOLN: INTRAVENOUS | Status: AC

## 2021-07-10 NOTE — Progress Notes (Signed)
[facility] NAME: Bianca Taylor    MR#:  [id number]  DATE OF BIRTH:  1930/06/04  SUBJECTIVE:  came in with increasing confusion and poor PO intake with dry mouth. More awake today according to son in the ER. Try to take a few steps with me. Received IV fluids. Remains afebrile no chest pain.  REVIEW OF SYSTEMS:   Review of Systems  Unable to perform ROS: Mental status change  Tolerating Diet: Tolerating PT: pending  DRUG ALLERGIES:   Allergies  Allergen Reactions   Codeine     Other reaction(s): Dizziness   Fluocinolone Nausea And Vomiting   Moxifloxacin     Other reaction(s): Vomiting   Pravastatin     Other reaction(s): Muscle Pain   Risedronate Other (See Comments)    Bone pain   Tetracyclines & Related Nausea And Vomiting   Niacin Rash    VITALS:  Blood pressure 137/84, pulse 89, temperature 99.8 F (37.7 C), temperature source Rectal, resp. rate (!) 27, height 5' (1.524 m), weight 54.4 kg, SpO2 96 %.  PHYSICAL EXAMINATION:   Physical Exam  GENERAL:  87 y.o.-year-old patient lying in the bed with no acute distress.  HEENT: Head atraumatic, normocephalic. Oropharynx dry  LUNGS: shallow breath sounds bilaterally, no wheezing, rales, rhonchi. No use of accessory muscles of respiration.  CARDIOVASCULAR: S1, S2 normal. No murmurs, rubs, or gallops.  ABDOMEN: Soft, nontender, nondistended. Bowel sounds present. No organomegaly or mass.  EXTREMITIES: No cyanosis, clubbing or edema b/l.    NEUROLOGIC: non focal weak, deconditioned PSYCHIATRIC:  patient is alert   SKIN: No obvious rash, lesion, or ulcer.   LABORATORY PANEL:  CBC Recent Labs  Lab 07/10/21 0656  WBC 14.7*  HGB 12.7  HCT 39.1  PLT 479*    Chemistries  Recent Labs  Lab 07/09/21 0813 07/10/21 0656  NA 141 145  K 4.8 3.6  CL 105 118*  CO2 26 22  GLUCOSE 135* 94  BUN 26* 16  CREATININE 0.90 0.63  CALCIUM 8.8* 7.5*  AST 28  --   ALT 6   --   ALKPHOS 62  --   BILITOT 1.5*  --    Cardiac Enzymes No results for input(s): TROPONINI in the last 168 hours. RADIOLOGY:  CT Angio Chest PE W and/or Wo Contrast  Result Date: 07/09/2021 CLINICAL DATA:  PE suspected, low/intermediate prob, positive D-dimer Tachypneic tachycardic with low sats clear x-ray EXAM: CT ANGIOGRAPHY CHEST WITH CONTRAST TECHNIQUE: Multidetector CT imaging of the chest was performed using the standard protocol during bolus administration of intravenous contrast. Multiplanar CT image reconstructions and MIPs were obtained to evaluate the vascular anatomy. CONTRAST:  45mL OMNIPAQUE IOHEXOL 350 MG/ML SOLN COMPARISON:  Same day radiograph, July 28, 2017. FINDINGS: Cardiovascular: Evaluation is limited secondary to respiratory motion. Satisfactory opacification of the pulmonary arteries to the segmental level. No evidence of pulmonary embolism. Mildly enlarged heart size. No pericardial effusion. Three-vessel coronary artery atherosclerotic calcifications. Aortic valve calcifications. Mildly enlarged ascending thoracic aorta measuring approximately 4.1 by 4.0 cm. Mediastinum/Nodes: No axillary or mediastinal adenopathy. Visualized thyroid is unremarkable. Lungs/Pleura: Evaluation of fine parenchymal detail is limited secondary to respiratory motion. No pleural effusion or pneumothorax. Indistinct ground-glass opacity along the RIGHT lower lobe paramediastinal border is suboptimally assessed due to respiratory motion in this location. It is estimated to span 17 mm, previously 12 mm in 2018 (series 6, image 64). Scattered ground glass opacities of the RIGHT  apex. Lingular atelectasis. Upper Abdomen: Visualized stomach is decompressed, limiting evaluation. Mildly prominent gastric walls. Musculoskeletal: No acute osseous abnormality. Review of the MIP images confirms the above findings. IMPRESSION: 1. No pulmonary embolism within the limitations of the exam. 2. There is a 17 mm  ground-glass nodule in the RIGHT lower lobe which is suboptimally assessed due to respiratory motion. It appears to have increased in size since 2018 and may reflect indolent adenocarcinoma. When clinically appropriate and patient is better able to tolerate breath holds, recommend repeat chest CT for improved characterization. 3. Scattered ground-glass opacities in the RIGHT apex are nonspecific and may be infectious or inflammatory in etiology 4. Subjectively prominent gastric walls within a decompressed stomach. Findings could be due to underlying gastritis in the appropriate clinical setting. 5. Mild ectasia of the ascending thoracic aorta to 4.1 cm with calcifications of the aortic valve. This can be seen in the setting of aortic stenosis. Recommend annual imaging followup by CTA or MRA. This recommendation follows 2010 ACCF/AHA/AATS/ACR/ASA/SCA/SCAI/SIR/STS/SVM Guidelines for the Diagnosis and Management of Patients with Thoracic Aortic Disease. Circulation. 2010; 121: Q469-G295. Aortic aneurysm NOS (ICD10-I71.9) Aortic Atherosclerosis (ICD10-I70.0). Electronically Signed   By: Valentino Saxon M.D.   On: 07/09/2021 11:19   DG Chest Portable 1 View  Result Date: 07/09/2021 CLINICAL DATA:  Weakness EXAM: PORTABLE CHEST 1 VIEW COMPARISON:  Chest radiograph 07/23/2011, chest CT 07/28/2017 FINDINGS: The cardiomediastinal silhouette is normal. There is calcified atherosclerotic plaque of the aortic arch. There is no focal consolidation or pulmonary edema. There is no pleural effusion or pneumothorax. There is no acute osseous abnormality. IMPRESSION: No radiographic evidence of acute cardiopulmonary process. Electronically Signed   By: Valetta Mole M.D.   On: 07/09/2021 09:03   ASSESSMENT AND PLAN:   Bianca Taylor is a 87 y.o. female with medical history significant for CAD (had MI but no stents placed 2018), stroke without residual deficits (happened at same time as MI), DVT in left leg in the Spring 2022  now on Eliquis, who presents to the emergency department on 07/09/2021 with decreased eating/drinking and increased somnolence and increased generalized weakness.  Sepsis present on admission suspected due to pneumonia or UTI --will continue IV Rocephin. Patient unable to give any history regarding her UTI symptoms -- treat on basis of urine analysis -- urine culture negative so far  -- blood culture 1/2-- staph species -- repeat blood cultures again -- DC vancomycin for now -- trend white count  acute metabolic encephalopathy -- appears secondary to poor PO intake, failure to thrive with generalized weakness and fatigue ability secondary to infection -- seen by speech therapy follow dietary recommendation -- patient's mentation much improved today although remains intermittently pleasantly confused  clinical dehydration -- IV fluids  CAD without chest pain -- continue home meds  anorexia, poor PO intake, failure to thrive, protein calorie malnutrition -- dietitian to see patient  history of DVT continue oral anticoagulation -- patient follows with Dr Paulino Rily with esophagitis -- continue Protonix  right lower lobe pulmonary nodule -- patient had 17 mm ground glass nodule in the right lower lobe which is increase in size since 2018 may reflect indolent adenocarcinoma--- this was discussed with patient's son in the emergency room he agrees with currently treat the treatable and they will follow-up with primary care if they wish to depending on patient's overall condition    Procedures: Family communication : son in the ER Consults : CODE STATUS: DNR confirmed with son DVT  Prophylaxis : eliquis Level of care: Med-Surg Status is: Inpatient  TOC for discharge planning will consider palliative care if no improvement physical therapy        TOTAL TIME TAKING CARE OF THIS PATIENT: 25 minutes.  >50% time spent on counselling and coordination of care  Note: This  dictation was prepared with Dragon dictation along with smaller phrase technology. Any transcriptional errors that result from this process are unintentional.  [name] M.D    [facility]   CC: Primary care physician; [name], MD [name], female   DOB: 1930/03/23, 87 y.o.   MRN: [id number]

## 2021-07-10 NOTE — Progress Notes (Signed)
PHARMACY - PHYSICIAN COMMUNICATION CRITICAL VALUE ALERT - BLOOD CULTURE IDENTIFICATION (BCID)  Bianca Taylor is an 87 y.o. female who presented to [facility] on 07/09/2021 with a chief complaint of weakness and decreased oral intake  Note: appears Blood culture result was called to ED RN and not pharmacy.    Assessment: 10/18 Bcx with GPC in 1 bottle of each set.  BCID = Staphylococcus species (NOT S aureus, epidermidis, or lugdunensis).  On ceftriaxone for ? UTI.   Name of physician (or Provider) Contacted: Dr Serita Grit  Current antibiotics: ceftriaxone   Changes to prescribed antibiotics recommended:  Recommendations accepted by provider.  Monitor off additional antibiotics and repeat blood cultures  Results for orders placed or performed during the hospital encounter of 07/09/21  Blood Culture ID Panel (Reflexed) (Collected: 07/09/2021 12:15 PM)  Result Value Ref Range   Enterococcus faecalis NOT DETECTED NOT DETECTED   Enterococcus Faecium NOT DETECTED NOT DETECTED   Listeria monocytogenes NOT DETECTED NOT DETECTED   Staphylococcus species DETECTED (A) NOT DETECTED   Staphylococcus aureus (BCID) NOT DETECTED NOT DETECTED   Staphylococcus epidermidis NOT DETECTED NOT DETECTED   Staphylococcus lugdunensis NOT DETECTED NOT DETECTED   Streptococcus species NOT DETECTED NOT DETECTED   Streptococcus agalactiae NOT DETECTED NOT DETECTED   Streptococcus pneumoniae NOT DETECTED NOT DETECTED   Streptococcus pyogenes NOT DETECTED NOT DETECTED   A.calcoaceticus-baumannii NOT DETECTED NOT DETECTED   Bacteroides fragilis NOT DETECTED NOT DETECTED   Enterobacterales NOT DETECTED NOT DETECTED   Enterobacter cloacae complex NOT DETECTED NOT DETECTED   Escherichia coli NOT DETECTED NOT DETECTED   Klebsiella aerogenes NOT DETECTED NOT DETECTED   Klebsiella oxytoca NOT DETECTED NOT DETECTED   Klebsiella pneumoniae NOT DETECTED NOT DETECTED   Proteus species NOT DETECTED NOT DETECTED    Salmonella species NOT DETECTED NOT DETECTED   Serratia marcescens NOT DETECTED NOT DETECTED   Haemophilus influenzae NOT DETECTED NOT DETECTED   Neisseria meningitidis NOT DETECTED NOT DETECTED   Pseudomonas aeruginosa NOT DETECTED NOT DETECTED   Stenotrophomonas maltophilia NOT DETECTED NOT DETECTED   Candida albicans NOT DETECTED NOT DETECTED   Candida auris NOT DETECTED NOT DETECTED   Candida glabrata NOT DETECTED NOT DETECTED   Candida krusei NOT DETECTED NOT DETECTED   Candida parapsilosis NOT DETECTED NOT DETECTED   Candida tropicalis NOT DETECTED NOT DETECTED   Cryptococcus neoformans/gattii NOT DETECTED NOT DETECTED   Doreene Eland, PharmD, BCPS.   Work Cell: [phone] 07/10/2021 8:33 AM

## 2021-07-10 NOTE — ED Notes (Signed)
Speech Therapy in to see pt at this time

## 2021-07-10 NOTE — ED Notes (Signed)
Informed RN bed assigned 

## 2021-07-10 NOTE — Evaluation (Signed)
Clinical/Bedside Swallow Evaluation Patient Details  Name: Bianca Taylor MRN: [id number] Date of Birth: [birth year]  Today's Date: 07/10/2021 Time: SLP Start Time (ACUTE ONLY): 0850 SLP Stop Time (ACUTE ONLY): 0930 SLP Time Calculation (min) (ACUTE ONLY): 40 min  Past Medical History:  Past Medical History:  Diagnosis Date   Breast cancer ([place]) 2013   left breast   COPD (chronic obstructive pulmonary disease) ([place])    DVT (deep venous thrombosis) ([place]) 10/2020   LLE   GERD (gastroesophageal reflux disease)    Hypercholesterolemia    Hypertension    Ischemic cardiomyopathy    a. 01/2017 Echo: EF 45-50%, mod inflat and inf HK, Gr1 DD.   ST elevation myocardial infarction (STEMI) of inferior wall ([place])    a. 01/2017 in setting of acute stroke-->medically managed.   Stroke [facility])    a. 01/2017 MRI: 1 cm acute ischemic nonhemorrhagic lacunar type infarct involving the left lentiform nucleus. Mod cerebral atrophy, chronic microvascular isch dzs, multiple scattered remote infarcts, extensive chrnoic micro hemorrhages;  b. 01/2016 CTA head/neck: no acute IC/EC stenosis or dissection.   Past Surgical History:  Past Surgical History:  Procedure Laterality Date   BRAIN SURGERY     BREAST BIOPSY Left    positive 03/2012   BREAST BIOPSY Bilateral    negative 2000   BREAST EXCISIONAL BIOPSY Left    positive 04/2012   BREAST LUMPECTOMY Left 2013   BREAST MAMMOSITE Left    CATARACT EXTRACTION     ENTROPIAN REPAIR Right 04/27/2018   Procedure: REPAIR OF ENTROPION, SUTURES AND EXTENSIVE;  Surgeon: Karle Starch, MD;  Location: [place];  Service: Ophthalmology;  Laterality: Right;   HPI:  Pt is a 87 y.o. female with medical history significant for CAD (had MI but no stents placed 2018), DVT, inferior STEMI in the setting of acute stroke in 01/2017 (MI managed medically), hypertension, hyperlipidemia, breast cancer, COPD, and GERD who presents to the emergency department on 07/09/2021 with  decreased eating/drinking and increased somnolence and increased generalized weakness. At baseline for a month or more she will eat a bite of food then say she does not want anymore. Rarely drinks much of anything. Pt lives at home alone w/ husband w/ a Caregiver at times.   CT of Chest: "There is a 17 mm ground-glass nodule in the RIGHT lower lobe  which is suboptimally assessed due to respiratory motion. It appears  to have increased in size since 2018 and may reflect indolent  adenocarcinoma. When clinically appropriate and patient is better  able to tolerate breath holds, recommend repeat chest CT for  improved characterization.  3. Scattered ground-glass opacities in the RIGHT apex are  nonspecific and may be infectious or inflammatory in etiology  4. Subjectively prominent gastric walls within a decompressed  stomach. Findings could be due to underlying gastritis in the appropriate clinical setting.".    Assessment / Plan / Recommendation  Clinical Impression  Pt appears to present w/ oropharyngeal phase dysphagia suspect significantly impacted by her declined Pulmonary status. Pt also has h/o apparent FTT at home for ~1 month per chart notes. She appears deconditioned as well. During this eval, she was min anxious; was not overly interested in much po's offered by Clinician.  Oral assessment revealed a significantly dry mouth; sticky secretions. Oral care completed; lips sore.  During po trials, overt clinical s/s of aspiration noted x1 during ice chip trials; no overt/immediate coughing noted w/ few trials of thin liquids Via Cup(pt  helped to hold/feed) or trial of puree. No decline in O2 sats immediately following po trials; vocal quality remained dry/clear. Overall, respiratory status appeared discoordinated in conjunction w/ the oropharyngeal swallow. RR remained in the low 20s w/ intermittent tachypnea noted. Rest Breaks given b/t trials to ease any WOB/SOB.  Oral phase c/b slow oral motor  movements but given time, she was able to adequately manage bolus consistencies and clear oral cavity(majority). Suspect alternating boluses to be helpful for oral clearing, and Time b/t trials as well.  No unilateral oral weakness noted. Pt required MOD assistance w/ feeding/support to hold cup to drink.   Recommend a Dysphagia level 1 (puree) diet w/ thin liquids VIA CUP only; aspiration precautions; feeding assistance at all meals.  Concern for pt's ability to meet nutrition/hydration needs orally at this time d/t overall presentation and recent h/o apparent FTT per chart notes. Recommend a Palliative Care consult for [place]; Dietician f/u. SLP Visit Diagnosis: Dysphagia, oropharyngeal phase (R13.12) (deconditioned; poor pulmonary status)    Aspiration Risk  Mild aspiration risk;Risk for inadequate nutrition/hydration    Diet Recommendation   Dysphagia level 1 (puree) diet w/ thin liquids VIA CUP only; aspiration precautions; feeding assistance at all meals.  Medication Administration: Crushed with puree (for safer swallowing)    Other  Recommendations Recommended Consults:  (Palliative Care consult for [place]; Dietician f/u) Oral Care Recommendations: Oral care BID;Oral care before and after PO;Staff/trained caregiver to provide oral care Other Recommendations:  (n/a)    Recommendations for follow up therapy are one component of a multi-disciplinary discharge planning process, led by the attending physician.  Recommendations may be updated based on patient status, additional functional criteria and insurance authorization.  Follow up Recommendations  (TBD)      Frequency and Duration min 2x/week  2 weeks       Prognosis Prognosis for Safe Diet Advancement: Fair Barriers to Reach Goals: Time post onset;Behavior;Severity of deficits Barriers/Prognosis Comment: apparent FTT for past ~1 month per chart      Swallow Study   General Date of Onset: 07/09/21 HPI: Pt is a 87 y.o. female  with medical history significant for CAD (had MI but no stents placed 2018), DVT, inferior STEMI in the setting of acute stroke in 01/2017 (MI managed medically), hypertension, hyperlipidemia, breast cancer, COPD, and GERD who presents to the emergency department on 07/09/2021 with decreased eating/drinking and increased somnolence and increased generalized weakness. At baseline for a month or more she will eat a bite of food then say she does not want anymore. Rarely drinks much of anything. Pt lives at home alone w/ husband w/ a Caregiver at times.  CT of Chest: "There is a 17 mm ground-glass nodule in the RIGHT lower lobe  which is suboptimally assessed due to respiratory motion. It appears  to have increased in size since 2018 and may reflect indolent  adenocarcinoma. When clinically appropriate and patient is better  able to tolerate breath holds, recommend repeat chest CT for  improved characterization.  3. Scattered ground-glass opacities in the RIGHT apex are  nonspecific and may be infectious or inflammatory in etiology  4. Subjectively prominent gastric walls within a decompressed  stomach. Findings could be due to underlying gastritis in the appropriate clinical setting.". Type of Study: Bedside Swallow Evaluation Previous Swallow Assessment: 2018 - mild-moderate oropharyngeal phase dysphagia noted during this evaluation today. Pt also exhibits moderate dysarthria w/ Right labial/lingual asymmetry then. Diet Prior to this Study: NPO (unsure at home - bites/sips)  Temperature Spikes Noted: No (wbc 14.7 declining) Respiratory Status: Room air (O2 sats 90-92%) History of Recent Intubation: No Behavior/Cognition: Alert;Cooperative;Pleasant mood;Distractible;Requires cueing Oral Cavity Assessment: Dry (sticky secretions) Oral Care Completed by SLP: Yes Oral Cavity - Dentition: Missing dentition;Poor condition Vision: Functional for self-feeding Self-Feeding Abilities: Needs assist;Needs set up;Total  assist (can hold cup) Patient Positioning: Upright in bed (needed positioning) Baseline Vocal Quality: Normal (reduced pulmonary status/breath support at times) Volitional Cough:  (adequate) Volitional Swallow: Able to elicit    Oral/Motor/Sensory Function Overall Oral Motor/Sensory Function: Within functional limits (posterior lingual bunching/strength++; no unilateral weakness noted; speech clear)   Ice Chips Ice chips: Impaired Presentation: Spoon (6 trials) Oral Phase Impairments:  (reduced manipulation; slow) Oral Phase Functional Implications: Prolonged oral transit Pharyngeal Phase Impairments:  (brief) Other Comments: adequate senstation suspected   Thin Liquid Thin Liquid: Impaired Presentation: Self Fed;Cup (supported; 3 trials; 3 trials of sherbert) Oral Phase Impairments:  (breathing impacted coordination) Oral Phase Functional Implications: Prolonged oral transit Pharyngeal  Phase Impairments:  (none)    Nectar Thick Nectar Thick Liquid: Not tested   Honey Thick Honey Thick Liquid: Not tested   Puree Puree: Impaired Presentation: Spoon (fed; 1 trial) Oral Phase Impairments:  (breathing impacted coordination) Pharyngeal Phase Impairments:  (none) Other Comments: ddi not want further   Solid     Solid: Not tested        Orinda Kenner, MS, CCC-SLP Speech Language Pathologist Rehab Services [phone] [place] 07/10/2021,10:36 AM

## 2021-07-11 DIAGNOSIS — A419 Sepsis, unspecified organism: Secondary | ICD-10-CM | POA: Diagnosis not present

## 2021-07-11 MED ORDER — PANTOPRAZOLE 2 MG/ML SUSPENSION
40.0000 mg | Freq: Every day | ORAL | Status: DC
  Administered 2021-07-12 – 2021-07-15 (×4): 40 mg via ORAL
  Filled 2021-07-11 (×4): qty 20

## 2021-07-11 MED ORDER — ENSURE ENLIVE PO LIQD
237.0000 mL | Freq: Three times a day (TID) | ORAL | Status: DC
  Administered 2021-07-11 – 2021-07-16 (×12): 237 mL via ORAL

## 2021-07-11 MED ORDER — APIXABAN 2.5 MG PO TABS
2.5000 mg | ORAL_TABLET | Freq: Two times a day (BID) | ORAL | Status: DC
  Administered 2021-07-11 – 2021-07-15 (×8): 2.5 mg via ORAL
  Filled 2021-07-11 (×8): qty 1

## 2021-07-11 MED ORDER — DILTIAZEM HCL 30 MG PO TABS
30.0000 mg | ORAL_TABLET | Freq: Four times a day (QID) | ORAL | Status: DC
  Administered 2021-07-11 – 2021-07-13 (×7): 30 mg via ORAL
  Filled 2021-07-11 (×7): qty 1

## 2021-07-11 MED ORDER — SODIUM CHLORIDE 0.9 % IV SOLN: INTRAVENOUS | Status: DC

## 2021-07-11 NOTE — Progress Notes (Signed)
Chaplain Maggie visited at bedside during Rapid Response. Patient put her hand out to be held during the visit. Pt was alert and spoke to staff present in the room. Chaplain offered ministry of presence and silent prayer on behalf of pt. Continued support available per on call chaplain. Pastoral Care expects to follow up.

## 2021-07-11 NOTE — Significant Event (Signed)
Rapid Response Event Note   Reason for Call :  tachycardia  Initial Focused Assessment:  Rapid response RN arrived in patient's room with patient's nurse and chaplain present at bedside. Patient alert and reports no pain and that they couldn't feel their heart racing. HR anywhere from 110s to 140s on monitor. When 110s looked like ST but harder to tell on the monitor when higher rate. RR upper 20s but patient talking. Oxygen levels in upper 90s on room air in vitals taken just before rapid response RN arrival. Patient's nurse reports patient just lying in bed when elevated heart rate on monitor and no reports of elevated heart rate overnight. Lungs clear but diminished in upper lobes and coarse crackles in bases. Urine output in external catheter cannister clear but dark amber and small amount in cannister. Skin very dry and flaky. BP with SBP in 140s and MAP above 110.  Dr. Posey Pronto arrived shortly after rapid response RN and personally assessed patient.  Interventions:  12 lead EKG obtained. Rhythm appeared ST. MD assessed personally at bedside with RNs. Patient received her prn metoprolol IV dose. Dr. Posey Pronto ordered current bag of fluids to finish as bolus (approximately 250 mL in bag), ordered another 250 ml bolus after that, and for fluids to change to 75 mL/hr. BP post IVmetoprolol was 153-100 MAP 114 and HR in 80s SR.   Plan of Care:  Patient to stay on 1C for now.  Event Summary:   MD Notified: Dr. Posey Pronto Call Time: 08:04 Arrival Time: 08:06 End Time: 08:22  Stephanie Acre, RN

## 2021-07-11 NOTE — Progress Notes (Signed)
[place] [facility] [place] NAME: Bianca Taylor    MR#:  [id number]  DATE OF BIRTH:  07-31-1930  SUBJECTIVE:  came in with increasing confusion and poor PO intake with dry mouth. More awake today  rapid response was called given heart rate in the 122 140s. Patient was given bolus of IV fluid to 50 and metoprolol 5 mg IV push. Heart rate stays around 110 to 120s.  Daughter-in-law at bedside. Patient very poor PO intake. No appetite.  REVIEW OF SYSTEMS:   Review of Systems  Unable to perform ROS: Mental status change  Tolerating Diet: Tolerating PT: pending  DRUG ALLERGIES:   Allergies  Allergen Reactions   Codeine     Other reaction(s): Dizziness   Fluocinolone Nausea And Vomiting   Moxifloxacin     Other reaction(s): Vomiting   Pravastatin     Other reaction(s): Muscle Pain   Risedronate Other (See Comments)    Bone pain   Tetracyclines & Related Nausea And Vomiting   Niacin Rash    VITALS:  Blood pressure 125/81, pulse 95, temperature 98.5 F (36.9 C), temperature source Oral, resp. rate 20, height 5' (1.524 m), weight 54.4 kg, SpO2 97 %.  PHYSICAL EXAMINATION:   Physical Exam  GENERAL:  87 y.o.-year-old patient lying in the bed with no acute distress. chronically ill HEENT: Head atraumatic, normocephalic. Oropharynx dry LUNGS: shallow breath sounds bilaterally, no wheezing, rales, rhonchi. No use of accessory muscles of respiration.  CARDIOVASCULAR: S1, S2 normal. No murmurs, rubs, or gallops. Tachycardia ABDOMEN: Soft, nontender, nondistended. Bowel sounds present. No organomegaly or mass.  EXTREMITIES: No cyanosis, clubbing or edema b/l.    NEUROLOGIC: non focal weak, deconditioned PSYCHIATRIC:  patient is alert   SKIN: No obvious rash, lesion, or ulcer.   LABORATORY PANEL:  CBC Recent Labs  Lab 07/10/21 0656  WBC 14.7*  HGB 12.7  HCT 39.1  PLT 479*     Chemistries  Recent Labs  Lab 07/09/21 0813 07/10/21 0656   NA 141 145  K 4.8 3.6  CL 105 118*  CO2 26 22  GLUCOSE 135* 94  BUN 26* 16  CREATININE 0.90 0.63  CALCIUM 8.8* 7.5*  AST 28  --   ALT 6  --   ALKPHOS 62  --   BILITOT 1.5*  --     Cardiac Enzymes No results for input(s): TROPONINI in the last 168 hours. RADIOLOGY:  No results found. ASSESSMENT AND PLAN:   Bianca Taylor is a 87 y.o. female with medical history significant for CAD (had MI but no stents placed 2018), stroke without residual deficits (happened at same time as MI), DVT in left leg in the Spring 2022 now on Eliquis, who presents to the emergency department on 07/09/2021 with decreased eating/drinking and increased somnolence and increased generalized weakness.  Sepsis present on admission suspected due to pneumonia or UTI --will continue IV Rocephin. Patient unable to give any history regarding her UTI symptoms -- treat on basis of urine analysis -- urine culture negative so far  -- blood culture 1/2-- staph species -- repeat blood cultures -- so far negative -- DC vancomycin for now -- trend white count-- white count trending down  acute metabolic encephalopathy -- appears secondary to poor PO intake, failure to thrive with generalized weakness and fatigue ability secondary to infection -- seen by speech therapy follow dietary recommendation -- patient's mentation much improved today although remains intermittently pleasantly confused  clinical  dehydration sinus tachycardia -- IV fluids -- start patient on PO Cardizem 30 mg Q6 hourly and continue PO metoprolol  CAD without chest pain -- continue home meds  anorexia, poor PO intake, failure to thrive, protein calorie malnutrition -- dietitian to see patient -- will start patient on megace  history of DVT continue oral anticoagulation -- patient follows with Dr Tasia Catchings -- continue eliquis  Gerd with esophagitis -- continue Protonix  right lower lobe pulmonary nodule -- patient had 17 mm ground glass  nodule in the right lower lobe which is increase in size since 2018 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 Overall poor prognosis  Procedures: Family communication : daughter-in-law at bedside Consults : palliative care CODE STATUS: DNR confirmed with son DVT Prophylaxis : eliquis Level of care: Med-Surg Status is: Inpatient  TOC for discharge planning physical therapy        TOTAL TIME TAKING CARE OF THIS PATIENT: 25 minutes.  >50% time spent on counselling and coordination of care  Note: This dictation was prepared with Dragon dictation along with smaller phrase technology. Any transcriptional errors that result from this process are unintentional.  [name] M.D    [facility]   CC: Primary care physician; [name], MD [name], female   DOB: December 13, 1929, 87 y.o.   MRN: [id number]

## 2021-07-11 NOTE — Plan of Care (Signed)
Patient did not complain of abdominal pain .

## 2021-07-11 NOTE — Progress Notes (Addendum)
Initial Nutrition Assessment  DOCUMENTATION CODES:  Not applicable  INTERVENTION:  Continue current diet as recommend per SLP. Encourage PO intake. Ensure Enlive po TID, each supplement provides 350 kcal and 20 grams of protein Magic cup TID with meals, each supplement provides 290 kcal and 9 grams of protein  NUTRITION DIAGNOSIS:  Inadequate oral intake related to lethargy/confusion, poor appetite as evidenced by meal completion < 25%, per patient/family report.  GOAL:  Patient will meet greater than or equal to 90% of their needs  MONITOR:  PO intake, Supplement acceptance, Weight trends  REASON FOR ASSESSMENT:  Consult Assessment of nutrition requirement/status  ASSESSMENT:  87 y.o. female with history of breast cancer, COPD, GERD, HLD, HTN, and hx stroke and MI presented to ED with weakness. Reported poor PO intake for several weeks with virtually no intake x 3 days PTA.   Pt found to be dehydrated on admission. Imaging also shows a lung nodule which has grown in size.   Pt resting in bed at the time of assessment, calling out for "Charles." Pt confused, but easily redirectable. Unable to provide a nutrition hx at this time. Lunch tray at bedside, poorly consumed. Fat stores intact but pt does show signs of muscle depletions. Likely a combination of advanced age and poor nutrition recently.    Pt reports that she likes chocolate, will add nutrition supplements to augment intake. Obtained bed weight, similar to current documented. Pt appears weight stable.   Noted pt had rapid response called this AM. Palliative care consult pending.    Nutritionally Relevant Medications: Scheduled Meds:  atorvastatin  40 mg Oral Daily   pantoprazole (PROTONIX) IV  40 mg Intravenous Daily   Continuous Infusions:  sodium chloride 75 mL/hr at 07/11/21 0920   PRN Meds: bisacodyl, ondansetron, senna-docusate  Labs Reviewed  NUTRITION - FOCUSED PHYSICAL EXAM: Flowsheet Row Most Recent  Value  Orbital Region No depletion  Upper Arm Region No depletion  Thoracic and Lumbar Region No depletion  Buccal Region No depletion  Temple Region Mild depletion  Clavicle Bone Region Mild depletion  Clavicle and Acromion Bone Region Mild depletion  Scapular Bone Region No depletion  Dorsal Hand No depletion  Patellar Region No depletion  Anterior Thigh Region No depletion  Posterior Calf Region Mild depletion  Edema (RD Assessment) None  Hair Reviewed  Eyes Reviewed  Mouth Reviewed  Skin Reviewed  Nails Reviewed    Diet Order:   Diet Order             DIET - DYS 1 Room service appropriate? Yes with Assist; Fluid consistency: Thin  Diet effective now                   EDUCATION NEEDS:  Not appropriate for education at this time  Skin:  Skin Assessment: Reviewed RN Assessment  Last BM:     Height:  Ht Readings from Last 1 Encounters:  07/09/21 5' (1.524 m)    Weight:  Wt Readings from Last 1 Encounters:  07/09/21 54.4 kg    Ideal Body Weight:  45.5 kg  BMI:  Body mass index is 23.44 kg/m.  Estimated Nutritional Needs:  Kcal:  1400-1600 kcal/d Protein:  70-80 g/d Fluid:  1.5-1.6 L/d   Ranell Patrick, RD, LDN Clinical Dietitian RD pager # available in [place]  After hours/weekend pager # available in [facility]

## 2021-07-11 NOTE — Progress Notes (Signed)
   07/11/21 0803  Assess: MEWS Score  Temp 98 F (36.7 C)  BP (!) 152/100  Pulse Rate (!) 162  Resp 20  SpO2 98 %  Assess: MEWS Score  MEWS Temp 0  MEWS Systolic 0  MEWS Pulse 3  MEWS RR 0  MEWS LOC 0  MEWS Score 3  MEWS Score Color Yellow  Assess: if the MEWS score is Yellow or Red  Were vital signs taken at a resting state? Yes  Focused Assessment No change from prior assessment  Does the patient meet 2 or more of the SIRS criteria? Yes  Does the patient have a confirmed or suspected source of infection? Yes  Provider and Rapid Response Notified? Yes  MEWS guidelines implemented *See Row Information* Yes  Treat  MEWS Interventions Administered scheduled meds/treatments;Administered prn meds/treatments;Escalated (See documentation below)  Pain Scale 0-10  Pain Score 0  Take Vital Signs  Increase Vital Sign Frequency  Yellow: Q 2hr X 2 then Q 4hr X 2, if remains yellow, continue Q 4hrs  Escalate  MEWS: Escalate Yellow: discuss with charge nurse/RN and consider discussing with provider and RRT  Notify: Charge Nurse/RN  Name of Charge Nurse/RN Notified Estill Bamberg, RN  Date Charge Nurse/RN Notified 07/11/21  Time Charge Nurse/RN Notified 0804  Notify: Provider  Provider Name/Title [name], MD  Date Provider Notified 07/11/21  Time Provider Notified [phone]  Notification Type Page  Notification Reason Change in status  Provider response At bedside  Date of Provider Response 07/11/21  Time of Provider Response 0810  Notify: Rapid Response  Name of Rapid Response RN Notified Adalberto Ill, RN  Date Rapid Response Notified 07/11/21  Time Rapid Response Notified 0806  Document  Patient Outcome Stabilized after interventions  Progress note created (see row info) Yes  Assess: SIRS CRITERIA  SIRS Temperature  0  SIRS Pulse 1  SIRS Respirations  0  SIRS WBC 0  SIRS Score Sum  1

## 2021-07-12 DIAGNOSIS — A419 Sepsis, unspecified organism: Secondary | ICD-10-CM | POA: Diagnosis not present

## 2021-07-12 MED ORDER — MELATONIN 5 MG PO TABS
5.0000 mg | ORAL_TABLET | Freq: Every evening | ORAL | Status: DC | PRN
  Administered 2021-07-12: 23:00:00 5 mg via ORAL
  Filled 2021-07-12 (×2): qty 1

## 2021-07-12 MED ORDER — HYDROCODONE-ACETAMINOPHEN 5-325 MG PO TABS
1.0000 | ORAL_TABLET | Freq: Four times a day (QID) | ORAL | Status: DC | PRN
  Administered 2021-07-12 – 2021-07-13 (×2): 1 via ORAL
  Filled 2021-07-12 (×2): qty 1

## 2021-07-12 MED ORDER — MIRTAZAPINE 15 MG PO TABS
15.0000 mg | ORAL_TABLET | Freq: Every day | ORAL | Status: DC
  Administered 2021-07-12: 15 mg via ORAL
  Filled 2021-07-12: qty 1

## 2021-07-12 MED ORDER — IPRATROPIUM-ALBUTEROL 0.5-2.5 (3) MG/3ML IN SOLN: 3.0000 mL | RESPIRATORY_TRACT | Status: DC | PRN

## 2021-07-12 MED ORDER — MEGESTROL ACETATE 20 MG PO TABS
40.0000 mg | ORAL_TABLET | Freq: Every day | ORAL | Status: DC
  Administered 2021-07-12: 40 mg via ORAL
  Filled 2021-07-12: qty 2

## 2021-07-12 NOTE — TOC Progression Note (Signed)
Transition of Care [facility]) - Progression Note    Patient Details  Name: Bianca Taylor MRN: [id number] Date of Birth: 14-Mar-1930  Transition of Care [facility]) CM/SW [place], RN Phone Number: 07/12/2021, 5:00 PM  Clinical Narrative:  patient lives at home with spouse.  Awaiting disposition recommendations at this time, patient and family would like home health [facility]ne Cornea at advanced notified.       Expected Discharge Plan: [place] Barriers to Discharge: Continued Medical Work up  Expected Discharge Plan and Services Expected Discharge Plan: [place]   Discharge Planning Services: CM Consult Post Acute Care Choice: [place] arrangements for the past 2 months: Single Family Home                                       Social Determinants of Health (SDOH) Interventions    Readmission Risk Interventions No flowsheet data found.

## 2021-07-12 NOTE — Care Management Important Message (Signed)
Important Message  Patient Details  Name: Bianca Taylor MRN: [id number] Date of Birth: Nov 03, 1929   Medicare Important Message Given:  Yes     Juliann Pulse A Karson Reede 07/12/2021, 3:19 PM

## 2021-07-12 NOTE — Evaluation (Signed)
Occupational Therapy Evaluation Patient Details Name: Bianca Taylor MRN: [id number] DOB: [birth year] Today's Date: 07/12/2021   History of Present Illness 87 y.o. female with history of breast cancer, COPD, GERD, HLD, HTN, and hx stroke and MI presented to ED with weakness. Reported poor PO intake for several weeks with virtually no intake x 3 days PTA.   Clinical Impression   Patient presenting with decreased Ind in self care, balance, functional mobility/transfers, endurance, and safety awareness. Patient oriented to self only and her daughter in law present to confirm baseline. Family reports that pt normally ambulates with rollator, short distances, within the home. She performs her own bathing and dressing. Family assists with all IADLs for pt and spouse. Pt required encouragement and fatigues quickly  Patient currently needing max -total A for bed mobility. Once EOB, pt needing encouragement to remain upright and asking to be returned to bed. Static sitting balance of min - mod A for ~ 5 minutes. Patient will benefit from acute OT to increase overall independence in the areas of ADLs, functional mobility, and safety awareness in order to safely discharge to next venue of care.     Recommendations for follow up therapy are one component of a multi-disciplinary discharge planning process, led by the attending physician.  Recommendations may be updated based on patient status, additional functional criteria and insurance authorization.   Follow Up Recommendations  SNF    Equipment Recommendations  Other (comment) (defer to next venue of care)       Precautions / Restrictions Precautions Precautions: Fall      Mobility Bed Mobility Overal bed mobility: Needs Assistance Bed Mobility: Supine to Sit;Sit to Supine     Supine to sit: Max assist Sit to supine: Total assist   General bed mobility comments: cuing for technique and assist with B LEs and trunk    Transfers                  General transfer comment: Pt refusal    Balance Overall balance assessment: Needs assistance Sitting-balance support: Feet supported;Bilateral upper extremity supported Sitting balance-Leahy Scale: Fair Sitting balance - Comments: min A                                   ADL either performed or assessed with clinical judgement   ADL Overall ADL's : Needs assistance/impaired                     Lower Body Dressing: Total assistance                       Vision Patient Visual Report: No change from baseline              Pertinent Vitals/Pain Pain Assessment: Faces Faces Pain Scale: Hurts little more Pain Location: generalized Pain Descriptors / Indicators: Discomfort Pain Intervention(s): Limited activity within patient's tolerance;Repositioned;Monitored during session     Hand Dominance Right   Extremity/Trunk Assessment Upper Extremity Assessment Upper Extremity Assessment: Generalized weakness;Overall [facility] for tasks assessed   Lower Extremity Assessment Lower Extremity Assessment: Generalized weakness;Overall WFL for tasks assessed       Communication Communication Communication: No difficulties   Cognition Arousal/Alertness: Awake/alert Behavior During Therapy: Restless Overall Cognitive Status: History of cognitive impairments - at baseline  General Comments: Pt oriented to self only during session. Pt very confused overall.              Home Living Family/patient expects to be discharged to:: Private residence Living Arrangements: Spouse/significant other Available Help at Discharge: Family;Available PRN/intermittently Type of Home: House Home Access: Ramped entrance           Bathroom Shower/Tub: Walk-in shower   Bathroom Toilet: Handicapped height     Home Equipment: [place] - single point;Shower seat;Hand held shower head;Other (comment) (rollator)           Prior Functioning/Environment Level of Independence: Needs assistance  Gait / Transfers Assistance Needed: Pt uses rollator per daughter in law. No falls this year. Only short distance ambulation ADL's / Homemaking Assistance Needed: Pt's daughter in law reports pt has not been eating or caring for herself recently but normally she is able to perform all ADLs independently. Family assists with IADLs            OT Problem List: Decreased strength;Decreased activity tolerance;Impaired balance (sitting and/or standing);Decreased safety awareness;Decreased cognition;Decreased knowledge of precautions      OT Treatment/Interventions: Self-care/ADL training;Therapeutic exercise;Therapeutic activities;Energy conservation;DME and/or AE instruction;Patient/family education;Balance training;Manual therapy    OT Goals(Current goals can be found in the care plan section) Acute Rehab OT Goals Patient Stated Goal: to go to rehab OT Goal Formulation: With family Time For Goal Achievement: 07/26/21 Potential to Achieve Goals: Fair ADL Goals Pt Will Perform Grooming: with min assist;standing Pt Will Perform Lower Body Dressing: with min assist;sit to/from stand Pt Will Transfer to Toilet: with min assist;ambulating Pt Will Perform Toileting - Clothing Manipulation and hygiene: with min assist;sit to/from stand  OT Frequency: Min 2X/week   Barriers to D/C:    none known at this time          AM-PAC OT "6 Clicks" Daily Activity     Outcome Measure Help from another person eating meals?: A Little Help from another person taking care of personal grooming?: A Little Help from another person toileting, which includes using toliet, bedpan, or urinal?: Total Help from another person bathing (including washing, rinsing, drying)?: A Lot Help from another person to put on and taking off regular upper body clothing?: A Little Help from another person to put on and taking off regular lower body  clothing?: Total 6 Click Score: 13   End of Session Nurse Communication: Mobility status  Activity Tolerance: Patient limited by fatigue Patient left: in bed;with call bell/phone within reach;with bed alarm set;with family/visitor present  OT Visit Diagnosis: Unsteadiness on feet (R26.81);Muscle weakness (generalized) (M62.81)                Time: 7414-2395 OT Time Calculation (min): 21 min Charges:  OT General Charges $OT Visit: 1 Visit OT Evaluation $OT Eval Moderate Complexity: 1 Mod OT Treatments $Therapeutic Activity: 8-22 mins  Darleen Crocker, MS, OTR/L , CBIS ascom [phone]  07/12/21, 4:56 PM

## 2021-07-12 NOTE — Progress Notes (Signed)
Pt is not wheezing at this time. She is asleep. PRN Duonebs were ordered per RT consult by Dr. [name].

## 2021-07-12 NOTE — Progress Notes (Signed)
Bianca Taylor at [place] NAME: Bianca Taylor    MR#:  [id number]  DATE OF BIRTH:  Jan 15, 1930  SUBJECTIVE:   patient remained most of the day. Very poor PO intake according to RN. No family at bedside. Heart rate in the 90s. REVIEW OF SYSTEMS:   Review of Systems  Unable to perform ROS: Mental status change  Tolerating Diet: Tolerating PT: pending  DRUG ALLERGIES:   Allergies  Allergen Reactions   Codeine     Other reaction(s): Dizziness   Fluocinolone Nausea And Vomiting   Moxifloxacin     Other reaction(s): Vomiting   Pravastatin     Other reaction(s): Muscle Pain   Risedronate Other (See Comments)    Bone pain   Tetracyclines & Related Nausea And Vomiting   Niacin Rash    VITALS:  Blood pressure (!) 146/68, pulse (!) 101, temperature 97.9 F (36.6 C), temperature source Oral, resp. rate 20, height 5' (1.524 m), weight 54.4 kg, SpO2 95 %.  PHYSICAL EXAMINATION:   Physical Examlimited exam  GENERAL:  87 y.o.-year-old patient lying in the bed with no acute distress. chronically ill HEENT: Head atraumatic, normocephalic. Oropharynx dry LUNGS: shallow breath sounds bilaterally, no wheezing, rales, rhonchi. No use of accessory muscles of respiration.  CARDIOVASCULAR: S1, S2 normal. No murmurs, rubs, or gallops. Tachycardia ABDOMEN: Soft, nontender, nondistended. Bowel sounds present. EXTREMITIES: No cyanosis, clubbing or edema b/l.    NEUROLOGIC: non focal weak, deconditioned PSYCHIATRIC:  patient is sleepy  SKIN: No obvious rash, lesion, or ulcer.   LABORATORY PANEL:  CBC Recent Labs  Lab 07/10/21 0656  WBC 14.7*  HGB 12.7  HCT 39.1  PLT 479*     Chemistries  Recent Labs  Lab 07/09/21 0813 07/10/21 0656  NA 141 145  K 4.8 3.6  CL 105 118*  CO2 26 22  GLUCOSE 135* 94  BUN 26* 16  CREATININE 0.90 0.63  CALCIUM 8.8* 7.5*  AST 28  --   ALT 6  --   ALKPHOS 62  --   BILITOT 1.5*  --     Cardiac Enzymes No  results for input(s): TROPONINI in the last 168 hours. RADIOLOGY:  No results found. ASSESSMENT AND PLAN:   Bianca Taylor is a 87 y.o. female with medical history significant for CAD (had MI but no stents placed 2018), stroke without residual deficits (happened at same time as MI), DVT in left leg in the Spring 2022 now on Eliquis, who presents to the emergency department on 07/09/2021 with decreased eating/drinking and increased somnolence and increased generalized weakness.  Sepsis present on admission suspected due to pneumonia or UTI --will continue IV Rocephin. Patient unable to give any history regarding her UTI symptoms -- treat on basis of urine analysis -- urine culture negative so far  -- blood culture 1/2-- staph species -- repeat blood cultures -- so far negative -- DC vancomycin for now -- trend white count-- white count trending down -remains afebrile  acute metabolic encephalopathy -- appears secondary to poor PO intake, failure to thrive with generalized weakness and fatigue ability secondary to infection -- seen by speech therapy follow dietary recommendation -- patient's mentation much improved  although remains intermittently pleasantly confused  clinical dehydration sinus tachycardia -- IV fluids -- start patient on PO Cardizem 30 mg Q6 hourly and continue PO metoprolol --hr much better  CAD  -- continue home meds  anorexia, poor PO intake, failure to thrive, protein  calorie malnutrition -- dietitian to see patient -- will start patient on megace --continues to have poor po intake  history of DVT continue oral anticoagulation -- patient follows with Dr Tasia Catchings -- continue eliquis  [name] with esophagitis -- continue Protonix  right lower lobe pulmonary nodule -- patient had 17 mm ground glass nodule in the right lower lobe which is increase in size since 2018 may reflect indolent adenocarcinoma--- this was discussed with patient's son in the emergency room he  agrees with currently treat the treatable and they will follow-up with primary care if they wish to depending on patient's overall condition  consider palliative care consultation. Overall long term poor prognosis  Procedures: Family communication : daughter-in-law at bedside 10/20 Consults : palliative care CODE STATUS: DNR confirmed with son DVT Prophylaxis : eliquis Level of care: Med-Surg Status is: Inpatient  TOC for discharge planning physical therapy        TOTAL TIME TAKING CARE OF THIS PATIENT: 25 minutes.  >50% time spent on counselling and coordination of care  Note: This dictation was prepared with Dragon dictation along with smaller phrase technology. Any transcriptional errors that result from this process are unintentional.  Bianca Taylor M.D    [facility]   CC: Primary care physician; Bianca Hector, MD [name], female   DOB: 1930-01-05, 87 y.o.   MRN: [id number]

## 2021-07-13 DIAGNOSIS — A419 Sepsis, unspecified organism: Secondary | ICD-10-CM | POA: Diagnosis not present

## 2021-07-13 LAB — CULTURE, BLOOD (ROUTINE X 2)
Special Requests: ADEQUATE
Special Requests: ADEQUATE

## 2021-07-13 LAB — CBC
HCT: 39.4 % (ref 36.0–46.0)
Hemoglobin: 12.9 g/dL (ref 12.0–15.0)
MCH: 32.7 pg (ref 26.0–34.0)
MCHC: 32.7 g/dL (ref 30.0–36.0)
MCV: 100 fL (ref 80.0–100.0)
Platelets: 571 10*3/uL — ABNORMAL HIGH (ref 150–400)
RBC: 3.94 MIL/uL (ref 3.87–5.11)
RDW: 15.1 % (ref 11.5–15.5)
WBC: 9.9 10*3/uL (ref 4.0–10.5)
nRBC: 0 % (ref 0.0–0.2)

## 2021-07-13 MED ORDER — DILTIAZEM HCL 30 MG PO TABS
60.0000 mg | ORAL_TABLET | Freq: Two times a day (BID) | ORAL | Status: DC
  Administered 2021-07-13 – 2021-07-15 (×5): 60 mg via ORAL
  Filled 2021-07-13 (×5): qty 2

## 2021-07-13 NOTE — Progress Notes (Signed)
PT Cancellation Note  Patient Details Name: Bianca Taylor MRN: [id number] DOB: [birth year]   Cancelled Treatment:    Reason Eval/Treat Not Completed: Fatigue/lethargy limiting ability to participate.  Lethargic and will return to possibly sit up for midday meal.   Ramond Dial 07/13/2021, 11:02 AM  Mee Hives, PT PhD Acute Rehab Dept. Number: [place]

## 2021-07-13 NOTE — Progress Notes (Signed)
[place] at [place] NAME: Bianca Taylor    MR#:  [id number]  DATE OF BIRTH:  1930/07/14  SUBJECTIVE:   patient remains sleepy. Per RN--she got her prn melatonin last pm. Very poor PO intake according to RN.  DIL at bedside. Heart rate in the 90s. Awakens on verbal commands however drift back to sleep the state. Unable to do much PT today. REVIEW OF SYSTEMS:   Review of Systems  Unable to perform ROS: Mental status change  Tolerating Diet: Tolerating PT: pending  DRUG ALLERGIES:   Allergies  Allergen Reactions   Codeine     Other reaction(s): Dizziness   Fluocinolone Nausea And Vomiting   Moxifloxacin     Other reaction(s): Vomiting   Pravastatin     Other reaction(s): Muscle Pain   Risedronate Other (See Comments)    Bone pain   Tetracyclines & Related Nausea And Vomiting   Niacin Rash    VITALS:  Blood pressure 130/75, pulse 73, temperature 97.8 F (36.6 C), temperature source Oral, resp. rate 20, height 5' (1.524 m), weight 57.1 kg, SpO2 98 %.  PHYSICAL EXAMINATION:   Physical Examlimited exam  GENERAL:  87 y.o.-year-old patient lying in the bed with no acute distress. chronically ill HEENT: Head atraumatic, normocephalic. Oropharynx dry LUNGS: shallow breath sounds bilaterally, no wheezing, rales, rhonchi. No use of accessory muscles of respiration.  CARDIOVASCULAR: S1, S2 normal. No murmurs, rubs, or gallops. Tachycardia ABDOMEN: Soft, nontender, nondistended. Bowel sounds present. EXTREMITIES: No cyanosis, clubbing or edema b/l.    NEUROLOGIC: non focal weak, deconditioned PSYCHIATRIC:  patient is sleepy  SKIN: No obvious rash, lesion, or ulcer.   LABORATORY PANEL:  CBC Recent Labs  Lab 07/13/21 0500  WBC 9.9  HGB 12.9  HCT 39.4  PLT 571*     Chemistries  Recent Labs  Lab 07/09/21 0813 07/10/21 0656  NA 141 145  K 4.8 3.6  CL 105 118*  CO2 26 22  GLUCOSE 135* 94  BUN 26* 16  CREATININE 0.90 0.63   CALCIUM 8.8* 7.5*  AST 28  --   ALT 6  --   ALKPHOS 62  --   BILITOT 1.5*  --     Cardiac Enzymes No results for input(s): TROPONINI in the last 168 hours. RADIOLOGY:  No results found. ASSESSMENT AND PLAN:   Bianca Taylor is a 87 y.o. female with medical history significant for CAD (had MI but no stents placed 2018), stroke without residual deficits (happened at same time as MI), DVT in left leg in the Spring 2022 now on Eliquis, who presents to the emergency department on 07/09/2021 with decreased eating/drinking and increased somnolence and increased generalized weakness.  Sepsis present on admission suspected due to pneumonia or UTI --will continue IV Rocephin. Patient unable to give any history regarding her UTI symptoms -- treat on basis of urine analysis -- urine culture negative so far  -- blood culture 1/2-- staph species -- repeat blood cultures -- so far negative -- DC vancomycin for now -- trend white count-- white count trending down -remains afebrile  acute metabolic encephalopathy -- appears secondary to poor PO intake, failure to thrive with generalized weakness and fatigue ability secondary to infection -- seen by speech therapy follow dietary recommendation -- patient's mentation much improved  although remains intermittently pleasantly confused  clinical dehydration sinus tachycardia -- IV fluids -- start patient on PO Cardizem 30 mg Q6 hourly and continue PO  metoprolol --hr much better--change to cardizem 60 mg bd and cont BB  CAD  -- continue home meds except hold telmisartan  anorexia, poor PO intake, failure to thrive, protein calorie malnutrition -- dietitian to see patient -- will start patient on megace --continues to have poor po intake  history of DVT continue oral anticoagulation -- patient follows with Dr Tasia Catchings -- continue eliquis  [name] with esophagitis -- continue Protonix  right lower lobe pulmonary nodule -- patient had 17 mm ground  glass nodule in the right lower lobe which is increase in size since 2018 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 Overall long term poor prognosis spoke with daughter-in-law at bedside she understands overall long term poor prognosis if patient continues to not eat.  Procedures: Family communication : daughter-in-law at bedside 10/20 Consults : palliative care CODE STATUS: DNR confirmed with son DVT Prophylaxis : eliquis Level of care: Med-Surg Status is: Inpatient  TOC for discharge planning physical therapy  TOTAL TIME TAKING CARE OF THIS PATIENT: 25 minutes.  >50% time spent on counselling and coordination of care  Note: This dictation was prepared with Dragon dictation along with smaller phrase technology. Any transcriptional errors that result from this process are unintentional.  [name] M.D    [facility]   CC: Primary care physician; [name], MD [name], female   DOB: 1929-12-13, 87 y.o.   MRN: [id number]

## 2021-07-13 NOTE — NC FL2 (Signed)
[place] LEVEL OF CARE SCREENING TOOL     IDENTIFICATION  Patient Name: Bianca Taylor Birthdate: December 16, 1929 Sex: female Admission Date (Current Location): 07/09/2021  [facility] and [place] Number:  Engineering geologist and Address:  [facility], [address] [place], [place] [address]      Provider Number: [id number]  Attending Physician Name and Address:  [name], MD  Relative Name and Phone Number:  Lavette Yankovich, [phone], [phone]    Current Level of Care: SNF Recommended Level of Care: [place] Prior Approval Number:    Date Approved/Denied:   PASRR Number: [id number]  Discharge Plan: SNF    Current Diagnoses: Patient Active Problem List   Diagnosis Date Noted   Sepsis due to undetermined organism ([place]) 07/09/2021   Pneumonia, bacterial 07/09/2021   Acute cystitis without hematuria 07/09/2021   Hypoxia 07/09/2021   Protein calorie malnutrition ([place]) 07/09/2021   Long term current use of anticoagulant 42/59/5638   Acute metabolic encephalopathy 75/64/3329   Right lower lobe pulmonary nodule 07/09/2021   Petechial rash 07/09/2021   Anorexia 07/04/2021   PSVT (paroxysmal supraventricular tachycardia) ([place]) 01/04/2021   JAK-2 gene mutation 01/01/2021   History of ductal carcinoma in situ (DCIS) of breast 01/01/2021   Acute deep vein thrombosis (DVT) of femoral vein of left lower extremity ([place]) 01/01/2021   History of deep vein thrombosis (DVT) of lower extremity 01/01/2021   Cough 11/06/2018   PAC (premature atrial contraction) 11/06/2018   Ischemic cardiomyopathy 11/06/2018   Fibromyalgia 06/25/2017   Essential hypertension 06/25/2017   GERD (gastroesophageal reflux disease) 06/25/2017   History of stroke 06/10/2017   Hyperlipidemia LDL goal <70 06/10/2017   Coronary artery disease involving native coronary artery of native heart without angina pectoris 03/11/2017     Orientation RESPIRATION BLADDER Height & Weight     Self  O2 (Acute, 3 L of oxygen) Incontinent Weight: 125 lb 14.1 oz (57.1 kg) Height:  5' (152.4 cm)  BEHAVIORAL SYMPTOMS/MOOD NEUROLOGICAL BOWEL NUTRITION STATUS      Incontinent Diet (Currently receiving pureed foods)  AMBULATORY STATUS COMMUNICATION OF NEEDS Skin   Extensive Assist Verbally Normal                       Personal Care Assistance Level of Assistance  Bathing, Feeding, Dressing Bathing Assistance: Maximum assistance Feeding assistance: Maximum assistance Dressing Assistance: Maximum assistance     Functional Limitations Info  Sight, Hearing, Speech Sight Info: Adequate Hearing Info: Adequate Speech Info: Adequate    SPECIAL CARE FACTORS FREQUENCY                       Contractures Contractures Info: Not present    Additional Factors Info  Code Status, Allergies Code Status Info: DNR Allergies Info: Codeine, Fluocinolone, Moxifloxacin, Pravastatin, Risedronate, Tetracyclines & Related, Niacin           Current Medications (07/13/2021):  This is the current hospital active medication list Current Facility-Administered Medications  Medication Dose Route Frequency Provider Last Rate Last Admin   acetaminophen (TYLENOL) tablet 650 mg  650 mg Oral Q6H PRN Tacey Ruiz, MD   650 mg at 07/12/21 5188   Or   acetaminophen (TYLENOL) suppository 650 mg  650 mg Rectal Q6H PRN Tacey Ruiz, MD       apixaban Arne [place]) tablet 2.5 mg  2.5 mg Oral BID [name], MD   2.5 mg at 07/13/21  8333   atorvastatin (LIPITOR) tablet 40 mg  40 mg Oral Daily Tacey Ruiz, MD   40 mg at 07/12/21 2235   bisacodyl (DULCOLAX) EC tablet 5 mg  5 mg Oral Daily PRN Tacey Ruiz, MD       diltiazem (CARDIZEM) tablet 60 mg  60 mg Oral Q12H [name], MD   60 mg at 07/13/21 0854   feeding supplement (ENSURE ENLIVE / ENSURE PLUS) liquid 237 mL  237 mL Oral TID BM [name], MD   237 mL at 07/13/21 1238    HYDROcodone-acetaminophen (NORCO/VICODIN) 5-325 MG per tablet 1 tablet  1 tablet Oral Q6H PRN [name], MD   1 tablet at 07/12/21 2235   ipratropium-albuterol (DUONEB) 0.5-2.5 (3) MG/3ML nebulizer solution 3 mL  3 mL Nebulization Q4H PRN [name], MD       metoprolol tartrate (LOPRESSOR) injection 5 mg  5 mg Intravenous Q6H PRN Tacey Ruiz, MD   5 mg at 07/11/21 0813   metoprolol tartrate (LOPRESSOR) tablet 12.5 mg  12.5 mg Oral BID Tacey Ruiz, MD   12.5 mg at 07/13/21 0853   ondansetron (ZOFRAN) tablet 4 mg  4 mg Oral Q6H PRN Tacey Ruiz, MD       Or   ondansetron (ZOFRAN) injection 4 mg  4 mg Intravenous Q6H PRN Tacey Ruiz, MD       pantoprazole sodium (PROTONIX) 40 mg/20 mL oral suspension 40 mg  40 mg Oral Daily [name], MD   40 mg at 07/13/21 0855   senna-docusate (Senokot-S) tablet 1 tablet  1 tablet Oral QHS PRN Tacey Ruiz, MD         Discharge Medications: Please see discharge summary for a list of discharge medications.  Relevant Imaging Results:  Relevant Lab Results:   Additional Information SSN # [id number]. Patient is fully vaccinated and recevied 2 boosters. Patient is on 3 liters of oxygen but was not on any oxygen prior to hospital admission. Patient received last booster and flu shot on July 01, 2021  Raina Mina, [place]

## 2021-07-13 NOTE — Evaluation (Signed)
Physical Therapy Evaluation Patient Details Name: Bianca Taylor MRN: [id number] DOB: 14-Feb-1930 Today's Date: 07/13/2021  History of Present Illness  87 y.o. female with history of breast cancer, COPD, GERD, HLD, HTN, and hx stroke and MI presented to ED with weakness. Reported poor PO intake for several weeks with virtually no intake x 3 days PTA.  Clinical Impression  Pt was seen for mobility on side of bed and to control sitting balance as able.  Pt is anxious, and asked her daughter in law to sit with her to hold pt's hand.  Even so was still insecure in trying to move, and requires two person supervised and assisted help to even remain on side of bed.  Standing effort was limited, and pt could not clear the bed to stand even with direct assistance.  Recommend that PT work with a second person to allow for safer transfer to chair as pt will permit, and to have support to increase standing safety.  Encourage family support of the effort to get out of bed as pt is going to be more compliant with that help.  Focus hosp PT on standing endurance, LE strength, ROM at knees and increasing time OOB in chair.  SNF recommended unless pt can get to a point that she is safe to assist with RW at home with limited help.         Recommendations for follow up therapy are one component of a multi-disciplinary discharge planning process, led by the attending physician.  Recommendations may be updated based on patient status, additional functional criteria and insurance authorization.  Follow Up Recommendations SNF    Equipment Recommendations  None recommended by PT    Recommendations for Other Services       Precautions / Restrictions Precautions Precautions: Fall Precaution Comments: lethargic, anxious Restrictions Weight Bearing Restrictions: No      Mobility  Bed Mobility Overal bed mobility: Needs Assistance Bed Mobility: Supine to Sit;Sit to Supine     Supine to sit: Mod assist Sit to  supine: Mod assist        Transfers Overall transfer level: Needs assistance Equipment used: 1 person hand held assist Transfers: Sit to/from Stand Sit to Stand: Total assist         General transfer comment: Pt struggling to push through feet  Ambulation/Gait             General Gait Details: unable  Stairs            Wheelchair Mobility    Modified Rankin (Stroke Patients Only)       Balance Overall balance assessment: Needs assistance Sitting-balance support: Feet supported;Bilateral upper extremity supported Sitting balance-Leahy Scale: Fair Sitting balance - Comments: pt is fair at times, tends to lean over to R as daughter in law sat next to her     Standing balance-Leahy Scale: Zero                               Pertinent Vitals/Pain Pain Assessment: No/denies pain    Home Living Family/patient expects to be discharged to:: Private residence Living Arrangements: Spouse/significant other Available Help at Discharge: Family;Available PRN/intermittently Type of Home: House Home Access: Ramped entrance     Home Layout: One level Home Equipment: Cane - single point;Shower seat;Hand held shower head;Other (comment) Additional Comments: pt is not able to give history, harvested from the chart and daughter in law  Prior Function Level of Independence: Needs assistance   Gait / Transfers Assistance Needed: rollator for short trips, no recent falls  ADL's / Homemaking Assistance Needed: usually does all her own dressing and bathing        Hand Dominance   Dominant Hand: Right    Extremity/Trunk Assessment   Upper Extremity Assessment Upper Extremity Assessment: Generalized weakness    Lower Extremity Assessment Lower Extremity Assessment: Generalized weakness    Cervical / Trunk Assessment Cervical / Trunk Assessment: Kyphotic (weaker core)  Communication   Communication: No difficulties  Cognition  Arousal/Alertness: Lethargic;Awake/alert Behavior During Therapy: Flat affect Overall Cognitive Status: History of cognitive impairments - at baseline                                 General Comments: limited recall of history by pt      General Comments General comments (skin integrity, edema, etc.): Pt is assisted to side of bed with help to balance and encouragement, but is limited for endurance.  Asks to go back to bed but did ck BP due to dizzy feeling, not hypotensive wiht sit value 120/73    Exercises     Assessment/Plan    PT Assessment Patient needs continued PT services  PT Problem List Decreased strength;Decreased activity tolerance;Decreased balance;Decreased mobility;Decreased cognition;Decreased knowledge of use of DME;Decreased safety awareness;Cardiopulmonary status limiting activity       PT Treatment Interventions DME instruction;Gait training;Functional mobility training;Therapeutic activities;Therapeutic exercise;Balance training;Neuromuscular re-education;Patient/family education    PT Goals (Current goals can be found in the Care Plan section)  Acute Rehab PT Goals Patient Stated Goal: to go to rehab PT Goal Formulation: With patient/family Time For Goal Achievement: 07/27/21 Potential to Achieve Goals: Good    Frequency Min 2X/week   Barriers to discharge Decreased caregiver support (husband home with pt most of the day) dependent mobility now    Co-evaluation               AM-PAC PT "6 Clicks" Mobility  Outcome Measure Help needed turning from your back to your side while in a flat bed without using bedrails?: A Little Help needed moving from lying on your back to sitting on the side of a flat bed without using bedrails?: A Lot Help needed moving to and from a bed to a chair (including a wheelchair)?: A Lot Help needed standing up from a chair using your arms (e.g., wheelchair or bedside chair)?: A Lot Help needed to walk in  hospital room?: Total Help needed climbing 3-5 steps with a railing? : Total 6 Click Score: 11    End of Session Equipment Utilized During Treatment: Oxygen Activity Tolerance: Patient limited by fatigue;Treatment limited secondary to medical complications (Comment) Patient left: in bed;with call bell/phone within reach;with bed alarm set;with family/visitor present Nurse Communication: Mobility status PT Visit Diagnosis: Muscle weakness (generalized) (M62.81);History of falling (Z91.81);Adult, failure to thrive (R62.7);Dizziness and giddiness (R42)    Time: 0165-5374 PT Time Calculation (min) (ACUTE ONLY): 24 min   Charges:   PT Evaluation $PT Eval Moderate Complexity: 1 Mod PT Treatments $Therapeutic Activity: 8-22 mins       Ramond Dial 07/13/2021, 2:45 PM  Mee Hives, PT PhD Acute Rehab Dept. Number: [place] and [place]

## 2021-07-13 NOTE — TOC Initial Note (Signed)
Transition of Care [facility]) - Initial/Assessment Note    Patient Details  Name: Bianca Taylor MRN: [id number] Date of Birth: September 06, 1930  Transition of Care [facility]) CM/SW Contact:    Raina Mina, [place] Phone Number: 07/13/2021, 3:44 PM  Clinical Narrative: Family has decided to go with SNF vs home health. Patients daughter in law would like [facility] but is aware that the search will be expanded and they will have to choose from the facilities that will accept patient.                  Expected Discharge Plan: Skilled Nursing Facility Barriers to Discharge: Continued Medical Work up   Patient Goals and CMS Choice Patient states their goals for this hospitalization and ongoing recovery are:: Return home CMS Medicare.gov Compare Post Acute Care list provided to:: Other (Comment Required) (Daughter in law) Choice offered to / list presented to : NA (Disposition TBD)  Expected Discharge Plan and Services Expected Discharge Plan: [place]   Discharge Planning Services: CM Consult Post Acute Care Choice: [place] arrangements for the past 2 months: Single Family Home                                      Prior Living Arrangements/Services Living arrangements for the past 2 months: Single Family Home Lives with:: Self, Spouse Patient language and need for interpreter reviewed:: No Do you feel safe going back to the place where you live?: Yes      Need for Family Participation in Patient Care: Yes (Comment) Care giver support system in place?: Yes (comment) Current home services: DME (Patient has a walker at home) Criminal Activity/Legal Involvement Pertinent to Current Situation/Hospitalization: No - Comment as needed  Activities of Daily Living Home Assistive Devices/Equipment: Cane (specify quad or straight) ADL Screening (condition at time of admission) Patient's cognitive ability adequate to safely complete daily activities?: Yes Is the  patient deaf or have difficulty hearing?: No Does the patient have difficulty seeing, even when wearing glasses/contacts?: No Does the patient have difficulty concentrating, remembering, or making decisions?: No Patient able to express need for assistance with ADLs?: Yes Does the patient have difficulty dressing or bathing?: Yes Independently performs ADLs?: Yes (appropriate for developmental age) Does the patient have difficulty walking or climbing stairs?: Yes Weakness of Legs: Both Weakness of Arms/Hands: Both  Permission Sought/Granted Permission sought to share information with : Family Supports Permission granted to share information with :  (Son is POA)  Share Information with NAME: Marliss Coots and Azalya Galyon     Permission granted to share info w Relationship: Son and daughter in law  Permission granted to share info w Contact Information: [phone], [phone]  Emotional Assessment Appearance:: Appears stated age     Orientation: : Oriented to Self Alcohol / Substance Use: Not Applicable Psych Involvement: No (comment)  Admission diagnosis:  Inanition ([place]) [R64] Weakness [R53.1] Sepsis due to undetermined organism ([place]) [G86.7] Acute metabolic encephalopathy [Y19.50] Community acquired pneumonia, unspecified laterality [J18.9] Patient Active Problem List   Diagnosis Date Noted   Sepsis due to undetermined organism ([place]) 07/09/2021   Pneumonia, bacterial 07/09/2021   Acute cystitis without hematuria 07/09/2021   Hypoxia 07/09/2021   Protein calorie malnutrition ([place]) 07/09/2021   Long term current use of anticoagulant 93/26/7124   Acute metabolic encephalopathy 58/05/9832   Right lower lobe pulmonary nodule 07/09/2021   Petechial  rash 07/09/2021   Anorexia 07/04/2021   PSVT (paroxysmal supraventricular tachycardia) ([place]) 01/04/2021   JAK-2 gene mutation 01/01/2021   History of ductal carcinoma in situ (DCIS) of breast 01/01/2021   Acute deep vein  thrombosis (DVT) of femoral vein of left lower extremity ([place]) 01/01/2021   History of deep vein thrombosis (DVT) of lower extremity 01/01/2021   Cough 11/06/2018   PAC (premature atrial contraction) 11/06/2018   Ischemic cardiomyopathy 11/06/2018   Fibromyalgia 06/25/2017   Essential hypertension 06/25/2017   GERD (gastroesophageal reflux disease) 06/25/2017   History of stroke 06/10/2017   Hyperlipidemia LDL goal <70 06/10/2017   Coronary artery disease involving native coronary artery of native heart without angina pectoris 03/11/2017   PCP:  [name], MD Pharmacy:   [place] [place] [address] [place] [address] Phone: [phone] Fax: [phone]     Social Determinants of Health (SDOH) Interventions    Readmission Risk Interventions No flowsheet data found.

## 2021-07-14 DIAGNOSIS — E43 Unspecified severe protein-calorie malnutrition: Secondary | ICD-10-CM

## 2021-07-14 DIAGNOSIS — R630 Anorexia: Secondary | ICD-10-CM

## 2021-07-14 DIAGNOSIS — Z86718 Personal history of other venous thrombosis and embolism: Secondary | ICD-10-CM

## 2021-07-14 DIAGNOSIS — A419 Sepsis, unspecified organism: Secondary | ICD-10-CM | POA: Diagnosis not present

## 2021-07-14 MED ORDER — METOPROLOL TARTRATE 25 MG PO TABS
25.0000 mg | ORAL_TABLET | Freq: Two times a day (BID) | ORAL | Status: DC
  Administered 2021-07-14 – 2021-07-15 (×3): 25 mg via ORAL
  Filled 2021-07-14 (×3): qty 1

## 2021-07-14 NOTE — TOC Progression Note (Signed)
Transition of Care [facility]) - Progression Note    Patient Details  Name: Bianca Taylor MRN: [id number] Date of Birth: 02/04/1930  Transition of Care [facility] [place]) CM/SW [place], [place] Phone Number: 07/14/2021, 9:12 AM  Clinical Narrative:   SNF referrals have been faxed out     Expected Discharge Plan: [place] Barriers to Discharge: Continued Medical Work up  Expected Discharge Plan and Services Expected Discharge Plan: [place]   Discharge Planning Services: CM Consult Post Acute Care Choice: [place] arrangements for the past 2 months: Single Family Home                                       Social Determinants of Health (SDOH) Interventions    Readmission Risk Interventions No flowsheet data found.

## 2021-07-14 NOTE — Progress Notes (Signed)
[place] at [place] NAME: Bianca Taylor    MR#:  [id number]  DATE OF BIRTH:  13-Dec-1929  SUBJECTIVE:   patient remains sleepy. Response verbal commands. Poor PO intake. Heart rate in the 90s. Awakens on verbal commands however drift back to sleep the state.   no family at bedside. REVIEW OF SYSTEMS:   Review of Systems  Unable to perform ROS: Mental status change  Tolerating Diet: Tolerating PT: SNF  DRUG ALLERGIES:   Allergies  Allergen Reactions   Codeine     Other reaction(s): Dizziness   Fluocinolone Nausea And Vomiting   Moxifloxacin     Other reaction(s): Vomiting   Pravastatin     Other reaction(s): Muscle Pain   Risedronate Other (See Comments)    Bone pain   Tetracyclines & Related Nausea And Vomiting   Niacin Rash    VITALS:  Blood pressure (!) 152/87, pulse 100, temperature 98 F (36.7 C), resp. rate 18, height 5' (1.524 m), weight 56.8 kg, SpO2 99 %.  PHYSICAL EXAMINATION:   Physical Examlimited exam  GENERAL:  87 y.o.-year-old patient lying in the bed with no acute distress. chronically ill HEENT: Head atraumatic, normocephalic. Oropharynx dry LUNGS: shallow breath sounds bilaterally, no wheezing, rales, rhonchi. No use of accessory muscles of respiration.  CARDIOVASCULAR: S1, S2 normal. No murmurs, rubs, or gallops. Tachycardia ABDOMEN: Soft, nontender, nondistended. Bowel sounds present.  EXTREMITIES: No cyanosis, clubbing or edema b/l.    NEUROLOGIC: non focal weak, deconditioned PSYCHIATRIC:  patient is sleepy  SKIN:dry skin  LABORATORY PANEL:  CBC Recent Labs  Lab 07/13/21 0500  WBC 9.9  HGB 12.9  HCT 39.4  PLT 571*     Chemistries  Recent Labs  Lab 07/09/21 0813 07/10/21 0656  NA 141 145  K 4.8 3.6  CL 105 118*  CO2 26 22  GLUCOSE 135* 94  BUN 26* 16  CREATININE 0.90 0.63  CALCIUM 8.8* 7.5*  AST 28  --   ALT 6  --   ALKPHOS 62  --   BILITOT 1.5*  --     Cardiac  Enzymes No results for input(s): TROPONINI in the last 168 hours. RADIOLOGY:  No results found. ASSESSMENT AND PLAN:   Bianca Taylor is a 87 y.o. female with medical history significant for CAD (had MI but no stents placed 2018), stroke without residual deficits (happened at same time as MI), DVT in left leg in the Spring 2022 now on Eliquis, who presents to the emergency department on 07/09/2021 with decreased eating/drinking and increased somnolence and increased generalized weakness.  Sepsis present on admission suspected due to pneumonia or UTI --will continue IV Rocephin. Patient unable to give any history regarding her UTI symptoms -- treat on basis of urine analysis -- urine culture negative so far  -- blood culture 1/2-- staph species -- repeat blood cultures -- so far negative -- DC vancomycin for now -- trend white count-- white count trending down - remains afebrile  acute metabolic encephalopathy -- appears secondary to poor PO intake, failure to thrive with generalized weakness and fatigue ability secondary to infection -- seen by speech therapy follow dietary recommendation -- patient's mentation much improved  although remains intermittently pleasantly confused  clinical dehydration sinus tachycardia -- IV fluids -- start patient on PO Cardizem 30 mg Q6 hourly and continue PO metoprolol --hr much better--changed  to cardizem 60 mg bd and cont BB  CAD  -- continue home  meds except hold telmisartan  anorexia, poor PO intake, failure to thrive, protein calorie malnutrition -- dietitian to see patient --continues to have poor po intake -- Megace CI due to DVt and remeron made her very sleepy--cont to encourage to feed her  history of DVT continue oral anticoagulation -- patient follows with Dr Bianca Taylor -- continue eliquis  [name] with esophagitis -- continue Protonix  right lower lobe pulmonary nodule -- patient had 17 mm ground glass nodule in the right lower lobe  which is increase in size since 2018 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 Overall long term poor prognosis spoke with daughter-in-law at bedside she understands overall long term poor prognosis if patient continues to not eat.  Procedures: Family communication : daughter-in-law at bedside 10/22 Consults : palliative care CODE STATUS: DNR confirmed with son DVT Prophylaxis : eliquis Level of care: Med-Surg Status is: Inpatient  TOC for discharge planning possible Rehab physical therapy recommends rehab Pt will benefit from out pt Palliative care  TOTAL TIME TAKING CARE OF THIS PATIENT: 25 minutes.  >50% time spent on counselling and coordination of care  Note: This dictation was prepared with Dragon dictation along with smaller phrase technology. Any transcriptional errors that result from this process are unintentional.  Bianca Taylor M.D    [facility]   CC: Primary care physician; Bianca Hector, MD [name], female   DOB: 21-May-1930, 87 y.o.   MRN: [id number]

## 2021-07-15 DIAGNOSIS — A419 Sepsis, unspecified organism: Secondary | ICD-10-CM | POA: Diagnosis not present

## 2021-07-15 DIAGNOSIS — Z7189 Other specified counseling: Secondary | ICD-10-CM | POA: Diagnosis not present

## 2021-07-15 LAB — CULTURE, BLOOD (ROUTINE X 2)
Culture: NO GROWTH
Culture: NO GROWTH
Special Requests: ADEQUATE
Special Requests: ADEQUATE

## 2021-07-15 MED ORDER — MORPHINE SULFATE (CONCENTRATE) 10 MG/0.5ML PO SOLN: 5.0000 mg | ORAL | Status: DC | PRN

## 2021-07-15 NOTE — Care Management Important Message (Signed)
Important Message  Patient Details  Name: Bianca Taylor MRN: [id number] Date of Birth: 24-Jun-1930   Medicare Important Message Given:  Other (see comment)  Patient is on comfort care with plan to transfer to the Hospice Home. Out of respect for the patient and family no Important Message from [facility] given.    Juliann Pulse A Dewanda Fennema 07/15/2021, 3:19 PM

## 2021-07-15 NOTE — Progress Notes (Signed)
[place] at [place] NAME: Bianca Taylor    MR#:  [id number]  DATE OF BIRTH:  1930/03/30  SUBJECTIVE:   patient remains sleepy. Response verbal commands. Poor PO intake. Heart rate in the 90s. Awakens on verbal commands however drift back to sleep the state.   DIL--Bianca Taylor is  at bedside. REVIEW OF SYSTEMS:   Review of Systems  Unable to perform ROS: Mental status change  Tolerating Diet: NO   DRUG ALLERGIES:   Allergies  Allergen Reactions   Codeine     Other reaction(s): Dizziness   Fluocinolone Nausea And Vomiting   Moxifloxacin     Other reaction(s): Vomiting   Pravastatin     Other reaction(s): Muscle Pain   Risedronate Other (See Comments)    Bone pain   Tetracyclines & Related Nausea And Vomiting   Niacin Rash    VITALS:  Blood pressure 124/90, pulse 76, temperature 97.9 F (36.6 C), temperature source Oral, resp. rate 16, height 5' (1.524 m), weight 55.4 kg, SpO2 98 %.  PHYSICAL EXAMINATION:   Physical Examlimited exam  GENERAL:  87 y.o.-year-old patient lying in the bed with no acute distress. chronically ill, farile HEENT: Head atraumatic, normocephalic. Oropharynx dry LUNGS: shallow breath sounds bilaterally, no wheezing, rales, rhonchi. No use of accessory muscles of respiration.  CARDIOVASCULAR: S1, S2 normal. No murmurs, rubs, or gallops. Tachycardia ABDOMEN: Soft, nontender, nondistended. Bowel sounds present.  EXTREMITIES: No cyanosis, clubbing or edema b/l.    NEUROLOGIC: non focal weak, deconditioned PSYCHIATRIC:  patient is sleepy  SKIN:dry skin  LABORATORY PANEL:  CBC Recent Labs  Lab 07/13/21 0500  WBC 9.9  HGB 12.9  HCT 39.4  PLT 571*     Chemistries  Recent Labs  Lab 07/09/21 0813 07/10/21 0656  NA 141 145  K 4.8 3.6  CL 105 118*  CO2 26 22  GLUCOSE 135* 94  BUN 26* 16  CREATININE 0.90 0.63  CALCIUM 8.8* 7.5*  AST 28  --   ALT 6  --   ALKPHOS 62  --   BILITOT 1.5*  --      Cardiac Enzymes No results for input(s): TROPONINI in the last 168 hours. RADIOLOGY:  No results found. ASSESSMENT AND PLAN:   Bianca Taylor is a 87 y.o. female with medical history significant for CAD (had MI but no stents placed 2018), stroke without residual deficits (happened at same time as MI), DVT in left leg in the Spring 2022 now on Eliquis, who presents to the emergency department on 07/09/2021 with decreased eating/drinking and increased somnolence and increased generalized weakness.  Sepsis present on admission suspected due to pneumonia or UTI --will continue IV Rocephin. Patient unable to give any history regarding her UTI symptoms -- treat on basis of urine analysis -- urine culture negative so far  -- blood culture 1/2-- staph species -- repeat blood cultures -- so far negative -- DC vancomycin for now -- trend white count-- white count trending down - remains afebrile  acute metabolic encephalopathy -- appears secondary to poor PO intake, failure to thrive with generalized weakness and fatigue ability secondary to infection -- seen by speech therapy follow dietary recommendation -- patient's mentation much improved  although remains intermittently pleasantly confused  clinical dehydration sinus tachycardia -- recievedIV fluids  anorexia, poor PO intake, failure to thrive, protein calorie malnutrition -- dietitian to see patient --continues to have poor po intake -- Megace CI due to DVt  and remeron made her very sleepy--cont to encourage to feed her --pt continues with very poor po intake  history of DVT continue oral anticoagulation Bianca Taylor with esophagitis right lower lobe pulmonary nodule  Appreciate  palliative care consultation. Overall long term poor prognosis spoke with daughter-in-law at bedside she understands overall long term poor prognosis and they are requesting hospice  Procedures: Family communication : daughter-in-law at bedside  10/23 Consults : palliative care CODE STATUS: DNR confirmed with son Level of care: Med-Surg Status is: Inpatient  Pt will transfer to hospice when bed available  TOTAL TIME TAKING CARE OF THIS PATIENT: 25 minutes.  >50% time spent on counselling and coordination of care  Note: This dictation was prepared with Dragon dictation along with smaller phrase technology. Any transcriptional errors that result from this process are unintentional.  Bianca Taylor M.D    [facility]   CC: Primary care physician; Bianca Hector, MD [name], female   DOB: 11-29-1929, 87 y.o.   MRN: [id number]

## 2021-07-15 NOTE — Progress Notes (Signed)
Occupational Therapy Treatment Patient Details Name: Bianca Taylor MRN: [id number] DOB: 05/20/1930 Today's Date: 07/15/2021   History of present illness 87 y.o. female with history of breast cancer, COPD, GERD, HLD, HTN, and hx stroke and MI presented to ED with weakness. Reported poor PO intake for several weeks with virtually no intake x 3 days PTA.   OT comments  Ms. Thaker seen for OT treatment on this date. Upon arrival to room pt awake but lethargic. Pt seated upright in bed with family present. Pt and family agreeable to tx.  Pt requires hand-over-hand to initiate grooming, facewashing and hairbrushing, long-sitting in bed. Pt perseverating during facewashing, unable to terminate action without object removed from hand. Pt performed 10 left leg knee stretches w/ knee and ankle supported. Per chart update post session, pt has been changed to comfort care. Will sign off.    Recommendations for follow up therapy are one component of a multi-disciplinary discharge planning process, led by the attending physician.  Recommendations may be updated based on patient status, additional functional criteria and insurance authorization.    Follow Up Recommendations  Other (comment) (Pt changed to comfort care)    Assistance Recommended at Discharge Other (comment) (Pt changed to comfort care)  Equipment Recommendations  Other (comment) (defer to next venue of care)    Recommendations for Other Services      Precautions / Restrictions Precautions Precautions: Fall Restrictions Weight Bearing Restrictions: No       Mobility Bed Mobility Overal bed mobility: Needs Assistance Bed Mobility: Supine to Sit (long sitting)     Supine to sit: Min assist Sit to supine: Min assist        Transfers                         Balance                                           ADL either performed or assessed with clinical judgement   ADL Overall ADL's : Needs  assistance/impaired                                       General ADL Comments: Pt requires hand over hand to initiate grooming, facewashing and hairbrushing, long- sitting. Pt perseverating during facewashing, unable to terminate action without object removed from hand.      Cognition Arousal/Alertness: Awake/alert Behavior During Therapy: WFL for tasks assessed/performed (perseverating during tasks) Overall Cognitive Status: History of cognitive impairments - at baseline                                            Exercises Exercises: Other exercises Other Exercises Other Exercises: Discussed d/c rec w/ family member, asked if wanted therapy, face washing, hair brushing w/ long sitting ~ 5 min, knee stretches Other Exercises: Pt performed 10 left leg knee stretches w/ knee and ankle supported   Shoulder Instructions       General Comments      Pertinent Vitals/ Pain       Pain Assessment: No/denies pain  Home Living  Prior Functioning/Environment              Frequency  Other (comment)        Progress Toward Goals  OT Goals(current goals can now be found in the care plan section)  Progress towards OT goals: Progressing toward goals  Acute Rehab OT Goals OT Goal Formulation: With family Time For Goal Achievement: 07/26/21 Potential to Achieve Goals: Fair ADL Goals Pt Will Perform Grooming: with min assist;standing Pt Will Perform Lower Body Dressing: with min assist;sit to/from stand Pt Will Transfer to Toilet: with min assist;ambulating Pt Will Perform Toileting - Clothing Manipulation and hygiene: with min assist;sit to/from stand  Plan Other (comment) (Pt changed to comfort care)    Co-evaluation                 AM-PAC OT "6 Clicks" Daily Activity     Outcome Measure   Help from another person eating meals?: A Little Help from another person taking  care of personal grooming?: A Lot Help from another person toileting, which includes using toliet, bedpan, or urinal?: Total Help from another person bathing (including washing, rinsing, drying)?: A Lot Help from another person to put on and taking off regular upper body clothing?: A Lot Help from another person to put on and taking off regular lower body clothing?: Total 6 Click Score: 11    End of Session    OT Visit Diagnosis: Unsteadiness on feet (R26.81);Muscle weakness (generalized) (M62.81)   Activity Tolerance Patient tolerated treatment well;Patient limited by fatigue   Patient Left in bed;with call bell/phone within reach;with bed alarm set   Nurse Communication          Time: 1416-1440 OT Time Calculation (min): 24 min  Charges: OT General Charges $OT Visit: 1 Visit OT Treatments $Self Care/Home Management : 8-22 mins $Therapeutic Activity: 8-22 mins  Nino Glow, OTS   Nino Glow 07/15/2021, 3:15 PM

## 2021-07-15 NOTE — Progress Notes (Signed)
[facility]) Hospital Liaison Note   Received request from Transitions of Care Manager Dreama Saa, RN for family interest in [place]. Visited patient at bedside and spoke with daughter-in-law Marliss Coots to confirm interest and explain services. Patient chart and information reviewed by [facility] physician. Hospice Home eligibility confirmed.   Unfortunately, Hospice Home is not able to offer a room today. Family and [facility] Manager aware hospital liaison will follow up tomorrow or sooner if a room becomes available.   Please do not hesitate to call with any hospice related questions.    Thank you for the opportunity to participate in this patient's care.   Bobbie "Loren Racer, RN, BSN [facility] Liaison [phone]

## 2021-07-15 NOTE — TOC Progression Note (Signed)
Transition of Care [facility]) - Progression Note    Patient Details  Name: Bianca Taylor MRN: [id number] Date of Birth: 12-Apr-1930  Transition of Care [facility]) CM/SW [place], RN Phone Number: 07/15/2021, 12:26 PM  Clinical Narrative:   [place] referral, [facility] to see patient.  TOC contact information provided, TOC to follow.     Expected Discharge Plan: [place] Barriers to Discharge: Continued Medical Work up  Expected Discharge Plan and Services Expected Discharge Plan: [place]   Discharge Planning Services: CM Consult Post Acute Care Choice: [place] arrangements for the past 2 months: Single Family Home                                       Social Determinants of Health (SDOH) Interventions    Readmission Risk Interventions No flowsheet data found.

## 2021-07-15 NOTE — Consult Note (Addendum)
Consultation Note Date: 07/15/2021   Patient Name: Bianca Taylor  DOB: April 17, 1930  MRN: [id number]  Age / Sex: 87 y.o., female  PCP: [name], MD Referring Physician: [name], MD  Reason for Consultation: Establishing goals of care  HPI/Patient Profile: Bianca Taylor is a 87 y.o. female with medical history significant for CAD (had MI but no stents placed 2018), stroke without residual deficits (happened at same time as MI), DVT in left leg in the Spring 2022 now on Eliquis, who presents to the emergency department on 07/09/2021 with decreased eating/drinking and increased somnolence and increased generalized weakness.  Clinical Assessment and Goals of Care: Patient is resting in bed. She appears very weak and frail resting with eyes closed. She is confused and does not answer questions asked. Son Juanda Crumble III who is HPOA and his wife are present. He states he is 1 of 3 children. He states patient is married and lives alone with her husband. Greenley tells me he and his wife "do everything for them but sleep there."   He states functionally, his mother and father sleep in recliners and on the couch as they do not want a bedroom downstairs, but will not walk up stairs. He states she has progressively weakened, and has baseline dementia. He states patient has not been eating well.   We discussed her diagnosis, prognosis, GOC, EOL wishes disposition and options.  Created space and opportunity for patient  to explore thoughts and feelings regarding current medical information.   A detailed discussion was had today regarding advanced directives.  Concepts specific to code status, artifical feeding and hydration, IV antibiotics and rehospitalization were discussed.  The difference between an aggressive medical intervention path and a comfort care path was discussed.  Values and goals of care important to  patient and family were attempted to be elicited.  Discussed limitations of medical interventions to prolong quality of life in some situations and discussed the concept of human mortality.  Son would like Bianca Taylor to go to the hospice facility.        SUMMARY OF RECOMMENDATIONS   Recommend hospice facility placement.   Prognosis:  < 2 weeks Bites and sips.        Primary Diagnoses: Present on Admission:  Sepsis due to undetermined organism ([place])  Pneumonia, bacterial  Acute cystitis without hematuria  Hypoxia  Anorexia  Protein calorie malnutrition ([place])  Coronary artery disease involving native coronary artery of native heart without angina pectoris  Acute metabolic encephalopathy  Right lower lobe pulmonary nodule  Petechial rash   I have reviewed the medical record, interviewed the patient and family, and examined the patient. The following aspects are pertinent.  Past Medical History:  Diagnosis Date   Breast cancer ([place]) 2013   left breast   COPD (chronic obstructive pulmonary disease) ([place])    DVT (deep venous thrombosis) ([place]) 10/2020   LLE   GERD (gastroesophageal reflux disease)    Hypercholesterolemia    Hypertension    Ischemic  cardiomyopathy    a. 01/2017 Echo: EF 45-50%, mod inflat and inf HK, Gr1 DD.   ST elevation myocardial infarction (STEMI) of inferior wall ([place])    a. 01/2017 in setting of acute stroke-->medically managed.   Stroke [facility])    a. 01/2017 MRI: 1 cm acute ischemic nonhemorrhagic lacunar type infarct involving the left lentiform nucleus. Mod cerebral atrophy, chronic microvascular isch dzs, multiple scattered remote infarcts, extensive chrnoic micro hemorrhages;  b. 01/2016 CTA head/neck: no acute IC/EC stenosis or dissection.   Social History   Socioeconomic History   Marital status: Married    Spouse name: Not on file   Number of children: Not on file   Years of education: Not on file   Highest education level: Not on file   Occupational History   Not on file  Tobacco Use   Smoking status: Former    Packs/day: 1.00    Years: 20.00    Pack years: 20.00    Types: Cigarettes    Quit date: 37    Years since quitting: 32.8   Smokeless tobacco: Never  Vaping Use   Vaping Use: Never used  Substance and Sexual Activity   Alcohol use: No   Drug use: Never   Sexual activity: Not on file  Other Topics Concern   Not on file  Social History Narrative   Not on file   Social Determinants of Health   Financial Resource Strain: Not on file  Food Insecurity: Not on file  Transportation Needs: Not on file  Physical Activity: Not on file  Stress: Not on file  Social Connections: Not on file   Family History  Problem Relation Age of Onset   Heart attack Father    Scheduled Meds:  apixaban  2.5 mg Oral BID   atorvastatin  40 mg Oral Daily   diltiazem  60 mg Oral Q12H   feeding supplement  237 mL Oral TID BM   metoprolol tartrate  25 mg Oral BID   pantoprazole sodium  40 mg Oral Daily   Continuous Infusions: PRN Meds:.acetaminophen **OR** acetaminophen, bisacodyl, ipratropium-albuterol, metoprolol tartrate, ondansetron **OR** ondansetron (ZOFRAN) IV, senna-docusate Medications Prior to Admission:  Prior to Admission medications   Medication Sig Start Date End Date Taking? Authorizing Provider  aspirin EC 81 MG tablet Take 81 mg by mouth daily. Swallow whole.   Yes [provider]  Butalbital-Acetaminophen 25-325 MG TABS Take 1 tablet by mouth as needed (migraine).   Yes [provider]  telmisartan (MICARDIS) 80 MG tablet Take 80 mg by mouth daily.   Yes [provider]  zolpidem (AMBIEN) 5 MG tablet Take 5 mg by mouth at bedtime as needed for sleep.   Yes [provider]  acetaminophen (TYLENOL) 325 MG tablet Take 2 tablets (650 mg total) by mouth every 6 (six) hours as needed for mild pain (or Fever >/= 101). 02/20/17   Nicholes Mango, MD  apixaban (ELIQUIS) 2.5 MG TABS  tablet Take 1 tablet (2.5 mg total) by mouth 2 (two) times daily. Patient not taking: Reported on 07/09/2021 05/21/21   Earlie Server, MD  atorvastatin (LIPITOR) 40 MG tablet TAKE 2 TABLETS BY MOUTH DAILY Patient taking differently: Take 40 mg by mouth daily. 03/05/21   End, Harrell Gave, MD  furosemide (LASIX) 20 MG tablet Take 20 mg by mouth daily as needed.    [provider]  metoprolol tartrate (LOPRESSOR) 25 MG tablet TAKE 1 TABLET BY MOUTH TWICE DAILY Patient taking differently: Take 12.5  mg by mouth 2 (two) times daily. 06/04/21   End, Harrell Gave, MD  pantoprazole (PROTONIX) 40 MG tablet Take 40 mg daily by mouth.    [provider]   Allergies  Allergen Reactions   Codeine     Other reaction(s): Dizziness   Fluocinolone Nausea And Vomiting   Moxifloxacin     Other reaction(s): Vomiting   Pravastatin     Other reaction(s): Muscle Pain   Risedronate Other (See Comments)    Bone pain   Tetracyclines & Related Nausea And Vomiting   Niacin Rash   Review of Systems  All other systems reviewed and are negative.  Physical Exam Constitutional:      Comments: Opened eyes briefly.   Pulmonary:     Effort: Pulmonary effort is normal.    Vital Signs: BP 124/90 (BP Location: Left Arm)   Pulse 76   Temp 97.9 F (36.6 C) (Oral)   Resp 16   Ht 5' (1.524 m)   Wt 55.4 kg   SpO2 98%   BMI 23.85 kg/m  Pain Scale: 0-10   Pain Score: 0-No pain   SpO2: SpO2: 98 % O2 Device:SpO2: 98 % O2 Flow Rate: .O2 Flow Rate (L/min): 1 L/min  IO: Intake/output summary:  Intake/Output Summary (Last 24 hours) at 07/15/2021 1217 Last data filed at 07/14/2021 2000 Gross per 24 hour  Intake 120 ml  Output 325 ml  Net -205 ml    LBM: Last BM Date: 07/11/21 Baseline Weight: Weight: 54.4 kg Most recent weight: Weight: 55.4 kg       Time In: 11:15 Time Out: 11:45 Time Total: 30 min Greater than 50%  of this time was spent counseling and coordinating care related to the above  assessment and plan. Signed by: Asencion Gowda, NP   Please contact Palliative Medicine Team phone at [phone] for questions and concerns.  For individual provider: See Shea Evans

## 2021-07-16 DIAGNOSIS — A419 Sepsis, unspecified organism: Secondary | ICD-10-CM | POA: Diagnosis not present

## 2021-07-16 DIAGNOSIS — G9341 Metabolic encephalopathy: Secondary | ICD-10-CM | POA: Diagnosis not present

## 2021-07-16 DIAGNOSIS — N3000 Acute cystitis without hematuria: Secondary | ICD-10-CM

## 2021-07-16 DIAGNOSIS — E43 Unspecified severe protein-calorie malnutrition: Secondary | ICD-10-CM | POA: Diagnosis not present

## 2021-07-16 MED ORDER — BISACODYL 5 MG PO TBEC: 5.0000 mg | DELAYED_RELEASE_TABLET | Freq: Every day | ORAL | 0 refills | Status: AC | PRN

## 2021-07-16 MED ORDER — MORPHINE SULFATE (CONCENTRATE) 10 MG/0.5ML PO SOLN: 5.0000 mg | ORAL | 0 refills | Status: AC | PRN

## 2021-07-16 NOTE — TOC Progression Note (Signed)
Transition of Care [facility]) - Progression Note    Patient Details  Name: Bianca Taylor MRN: [id number] Date of Birth: 05/18/1930  Transition of Care [facility]) CM/SW [place], RN Phone Number: 07/16/2021, 9:58 AM  Clinical Narrative:   Patient will transfer to [facility] in [place], [place] today via EMS at [facility].      Expected Discharge Plan: [place] Barriers to Discharge: Continued Medical Work up  Expected Discharge Plan and Services Expected Discharge Plan: [place]   Discharge Planning Services: CM Consult Post Acute Care Choice: [place] arrangements for the past 2 months: Single Family Home Expected Discharge Date: 07/16/21                                     Social Determinants of Health (SDOH) Interventions    Readmission Risk Interventions No flowsheet data found.

## 2021-07-16 NOTE — Progress Notes (Signed)
Nutrition Brief Note  Chart reviewed. Pt now transitioning to comfort care.  No further nutrition interventions planned at this time.  Please re-consult as needed.   Aleeah Greeno, RD, LDN Clinical Dietitian RD pager # available in AMION  After hours/weekend pager # available in AMION   

## 2021-07-16 NOTE — Progress Notes (Signed)
[facility]) Hospital Liaison Note   Hospice Home is able to offer a room today with request for transport at 11 am.   Family agreeable to transfer today. Dreama Saa, RN [facility] Manager aware.   RN please call report to [place] at [phone] prior to patient leaving the unit.  Please send signed and completed DNR with patient at discharge.   Please do not hesitate to call with any hospice related questions.    Thank you for the opportunity to participate in this patient's care.   Bobbie "Loren Racer, RN, BSN [facility] Liaison [phone]

## 2021-07-16 NOTE — Progress Notes (Signed)
Report called to [place] at [facility].  All questions answered, AVS in packet for transport.  Patient cleaned and ready for transport to Hospice.

## 2021-07-16 NOTE — Discharge Summary (Signed)
[facility] NAME: Bianca Taylor    MR#:  [id number]  DATE OF BIRTH:  1929-12-25  DATE OF ADMISSION:  07/09/2021 ADMITTING PHYSICIAN: [name], MD  DATE OF DISCHARGE: 07/16/2021  PRIMARY CARE PHYSICIAN: [name], MD    ADMISSION DIAGNOSIS:  Inanition ([place]) [R64] Weakness [R53.1] Sepsis due to undetermined organism ([place]) [B51.0] Acute metabolic encephalopathy [C58.52] Community acquired pneumonia, unspecified laterality [J18.9]  DISCHARGE DIAGNOSIS:  Sepsis POA Acute Cystitis w/o hematuria Failure to thrive/Severe Protein calorie Malnutrition SECONDARY DIAGNOSIS:   Past Medical History:  Diagnosis Date   Breast cancer ([place]) 2013   left breast   COPD (chronic obstructive pulmonary disease) ([place])    DVT (deep venous thrombosis) ([place]) 10/2020   LLE   GERD (gastroesophageal reflux disease)    Hypercholesterolemia    Hypertension    Ischemic cardiomyopathy    a. 01/2017 Echo: EF 45-50%, mod inflat and inf HK, Gr1 DD.   ST elevation myocardial infarction (STEMI) of inferior wall ([place])    a. 01/2017 in setting of acute stroke-->medically managed.   Stroke [facility])    a. 01/2017 MRI: 1 cm acute ischemic nonhemorrhagic lacunar type infarct involving the left lentiform nucleus. Mod cerebral atrophy, chronic microvascular isch dzs, multiple scattered remote infarcts, extensive chrnoic micro hemorrhages;  b. 01/2016 CTA head/neck: no acute IC/EC stenosis or dissection.    HOSPITAL COURSE:   CHRISLYN Taylor is a 87 y.o. female with medical history significant for CAD (had MI but no stents placed 2018), stroke without residual deficits (happened at same time as MI), DVT in left leg in the Spring 2022 now on Eliquis, who presents to the emergency department on 07/09/2021 with decreased eating/drinking and increased somnolence and increased generalized weakness.   Sepsis present on admission suspected due to pneumonia or UTI --will  continue IV Rocephin. Patient unable to give any history regarding her UTI symptoms -- treat on basis of urine analysis -- urine culture negative so far  -- blood culture 1/2-- staph species -- repeat blood cultures -- so far negative -- DC vancomycin for now -- trend white count-- white count trending down - remains afebrile   acute metabolic encephalopathy -- appears secondary to poor PO intake, failure to thrive with generalized weakness and fatigue ability secondary to infection -- seen by speech therapy follow dietary recommendation -- patient's mentation much improved  although remains intermittently pleasantly confused   clinical dehydration sinus tachycardia -- received IV fluids   anorexia, poor PO intake, failure to thrive, protein calorie malnutrition -- dietitian to see patient --continues to have poor po intake -- Megace CI due to DVt and remeron made her very sleepy--cont to encourage to feed her --pt continues with very poor po intake   history of DVT continue oral anticoagulation Gerd with esophagitis right lower lobe pulmonary nodule   Appreciate  palliative care consultation. Overall long term poor prognosis spoke with daughter-in-law at bedside she understands overall long term poor prognosis and they are requesting hospice. Pt will transfer to Hospice facility.   Procedures: Family communication : daughter-in-law at bedside 10/23 Consults : palliative care CODE STATUS: DNR confirmed with son Level of care: Med-Surg Status is: Inpatient   Pt will transfer to hospice today CONSULTS OBTAINED:    DRUG ALLERGIES:   Allergies  Allergen Reactions   Codeine     Other reaction(s): Dizziness   Fluocinolone Nausea And Vomiting   Moxifloxacin  Other reaction(s): Vomiting   Pravastatin     Other reaction(s): Muscle Pain   Risedronate Other (See Comments)    Bone pain   Tetracyclines & Related Nausea And Vomiting   Niacin Rash    DISCHARGE  MEDICATIONS:   Allergies as of 07/16/2021       Reactions   Codeine    Other reaction(s): Dizziness   Fluocinolone Nausea And Vomiting   Moxifloxacin    Other reaction(s): Vomiting   Pravastatin    Other reaction(s): Muscle Pain   Risedronate Other (See Comments)   Bone pain   Tetracyclines & Related Nausea And Vomiting   Niacin Rash        Medication List     STOP taking these medications    apixaban 2.5 MG Tabs tablet Commonly known as: Eliquis   aspirin EC 81 MG tablet   atorvastatin 40 MG tablet Commonly known as: LIPITOR   Butalbital-Acetaminophen 25-325 MG Tabs   furosemide 20 MG tablet Commonly known as: LASIX   metoprolol tartrate 25 MG tablet Commonly known as: LOPRESSOR   pantoprazole 40 MG tablet Commonly known as: PROTONIX   telmisartan 80 MG tablet Commonly known as: MICARDIS   zolpidem 5 MG tablet Commonly known as: AMBIEN       TAKE these medications    acetaminophen 325 MG tablet Commonly known as: TYLENOL Take 2 tablets (650 mg total) by mouth every 6 (six) hours as needed for mild pain (or Fever >/= 101).   bisacodyl 5 MG EC tablet Commonly known as: DULCOLAX Take 1 tablet (5 mg total) by mouth daily as needed for moderate constipation.   morphine CONCENTRATE 10 MG/0.5ML Soln concentrated solution Take 0.25 mLs (5 mg total) by mouth every 2 (two) hours as needed for severe pain, shortness of breath or anxiety.        If you experience worsening of your admission symptoms, develop shortness of breath, life threatening emergency, suicidal or homicidal thoughts you must seek medical attention immediately by calling 911 or calling your MD immediately  if symptoms less severe.  You Must read complete instructions/literature along with all the possible adverse reactions/side effects for all the Medicines you take and that have been prescribed to you. Take any new Medicines after you have completely understood and accept all the  possible adverse reactions/side effects.   Please note  You were cared for by a hospitalist during your hospital stay. If you have any questions about your discharge medications or the care you received while you were in the hospital after you are discharged, you can call the unit and asked to speak with the hospitalist on call if the hospitalist that took care of you is not available. Once you are discharged, your primary care physician will handle any further medical issues. Please note that NO REFILLS for any discharge medications will be authorized once you are discharged, as it is imperative that you return to your primary care physician (or establish a relationship with a primary care physician if you do not have one) for your aftercare needs so that they can reassess your need for medications and monitor your lab values. Today   SUBJECTIVE  Sitting up and being fed BF   VITAL SIGNS:  Blood pressure (!) 166/100, pulse 84, temperature (!) 97.5 F (36.4 C), temperature source Oral, resp. rate 18, height 5' (1.524 m), weight 55.4 kg, SpO2 (!) 87 %.  I/O:   Intake/Output Summary (Last 24 hours) at 07/16/2021 0907 Last  data filed at 07/15/2021 2200 Gross per 24 hour  Intake 0 ml  Output 450 ml  Net -450 ml    PHYSICAL EXAMINATION:  Physical Examlimited exam   GENERAL:  87 y.o.-year-old patient lying in the bed with no acute distress. chronically ill, fraile HEENT: Head atraumatic, normocephalic. Oropharynx dry LUNGS: shallow breath sounds bilaterally, no wheezing, rales, rhonchi. No use of accessory muscles of respiration.  CARDIOVASCULAR: S1, S2 normal. No murmurs, rubs, or gallops. Tachycardia ABDOMEN: Soft, nontender, nondistended. Bowel sounds present.  EXTREMITIES: No cyanosis, clubbing or edema b/l.    NEUROLOGIC: non focal weak, deconditioned PSYCHIATRIC:  patient is awake SKIN:dry skin DATA REVIEW:   CBC  Recent Labs  Lab 07/13/21 0500  WBC 9.9  HGB 12.9  HCT  39.4  PLT 571*    Chemistries  Recent Labs  Lab 07/10/21 0656  NA 145  K 3.6  CL 118*  CO2 22  GLUCOSE 94  BUN 16  CREATININE 0.63  CALCIUM 7.5*    Microbiology Results   Recent Results (from the past 240 hour(s))  Resp Panel by RT-PCR (Flu A&B, Covid) Nasopharyngeal Swab     Status: None   Collection Time: 07/09/21  9:29 AM   Specimen: Nasopharyngeal Swab; Nasopharyngeal(NP) swabs in vial transport medium  Result Value Ref Range Status   SARS Coronavirus 2 by RT PCR NEGATIVE NEGATIVE Final    Comment: (NOTE) SARS-CoV-2 target nucleic acids are NOT DETECTED.  The SARS-CoV-2 RNA is generally detectable in upper respiratory specimens during the acute phase of infection. The lowest concentration of SARS-CoV-2 viral copies this assay can detect is 138 copies/mL. A negative result does not preclude SARS-Cov-2 infection and should not be used as the sole basis for treatment or other patient management decisions. A negative result may occur with  improper specimen collection/handling, submission of specimen other than nasopharyngeal swab, presence of viral mutation(s) within the areas targeted by this assay, and inadequate number of viral copies(<138 copies/mL). A negative result must be combined with clinical observations, patient history, and epidemiological information. The expected result is Negative.  Fact Sheet for Patients:  EntrepreneurPulse.com.au  Fact Sheet for Healthcare Providers:  IncredibleEmployment.be  This test is no t yet approved or cleared by the [place] FDA and  has been authorized for detection and/or diagnosis of SARS-CoV-2 by FDA under an Emergency Use Authorization (EUA). This EUA will remain  in effect (meaning this test can be used) for the duration of the COVID-19 declaration under Section 564(b)(1) of the Act, 21 U.S.C.section 360bbb-3(b)(1), unless the authorization is terminated  or revoked sooner.        Influenza A by PCR NEGATIVE NEGATIVE Final   Influenza B by PCR NEGATIVE NEGATIVE Final    Comment: (NOTE) The Xpert Xpress SARS-CoV-2/FLU/RSV plus assay is intended as an aid in the diagnosis of influenza from Nasopharyngeal swab specimens and should not be used as a sole basis for treatment. Nasal washings and aspirates are unacceptable for Xpert Xpress SARS-CoV-2/FLU/RSV testing.  Fact Sheet for Patients: EntrepreneurPulse.com.au  Fact Sheet for Healthcare Providers: IncredibleEmployment.be  This test is not yet approved or cleared by the [place] FDA and has been authorized for detection and/or diagnosis of SARS-CoV-2 by FDA under an Emergency Use Authorization (EUA). This EUA will remain in effect (meaning this test can be used) for the duration of the COVID-19 declaration under Section 564(b)(1) of the Act, 21 U.S.C. section 360bbb-3(b)(1), unless the authorization is terminated or revoked.  Performed at [facility], [address] [place], [place] [address]   Culture, blood (routine x 2)     Status: Abnormal   Collection Time: 07/09/21 12:15 PM   Specimen: BLOOD  Result Value Ref Range Status   Specimen Description   Final    BLOOD LEFT AC Performed at [facility], [address], [place], [place] [address]    Special Requests   Final    BOTTLES DRAWN AEROBIC AND ANAEROBIC Blood Culture adequate volume Performed at [facility], [address] [place], [place] [address]    Culture  Setup Time   Final    GRAM POSITIVE COCCI AEROBIC BOTTLE ONLY CRITICAL VALUE NOTED.  VALUE IS CONSISTENT WITH PREVIOUSLY REPORTED AND CALLED VALUE. Performed at [facility], [address] [place], [place] [address]    Culture (A)  Final    STAPHYLOCOCCUS HOMINIS SUSCEPTIBILITIES PERFORMED ON PREVIOUS CULTURE WITHIN THE LAST 5 DAYS. Performed at [facility], [place] [address]  [place], [place] [address]    Report Status 07/13/2021 FINAL  Final  Culture, blood (routine x 2)     Status: Abnormal   Collection Time: 07/09/21 12:15 PM   Specimen: BLOOD  Result Value Ref Range Status   Specimen Description   Final    BLOOD RIGHT [facility] Performed at [facility], [address], [place], [place] [address]    Special Requests   Final    BOTTLES DRAWN AEROBIC AND ANAEROBIC Blood Culture adequate volume Performed at [facility], [address] [place], [place] [address]    Culture  Setup Time   Final    GRAM POSITIVE COCCI AEROBIC BOTTLE ONLY CRITICAL RESULT CALLED TO, READ BACK BY AND VERIFIED WITH: Cathie Hoops RN 0500 07/10/21 Performed at [facility], [place] [address] [place],  [address]    Culture STAPHYLOCOCCUS HOMINIS (A)  Final   Report Status 07/13/2021 FINAL  Final   Organism ID, Bacteria STAPHYLOCOCCUS HOMINIS  Final      Susceptibility   Staphylococcus hominis - MIC*    CIPROFLOXACIN <=0.5 SENSITIVE Sensitive     ERYTHROMYCIN <=0.25 SENSITIVE Sensitive     GENTAMICIN <=0.5 SENSITIVE Sensitive     OXACILLIN <=0.25 SENSITIVE Sensitive     TETRACYCLINE 2 SENSITIVE Sensitive     VANCOMYCIN <=0.5 SENSITIVE Sensitive     TRIMETH/SULFA <=10 SENSITIVE Sensitive     CLINDAMYCIN <=0.25 SENSITIVE Sensitive     RIFAMPIN <=0.5 SENSITIVE Sensitive     Inducible Clindamycin NEGATIVE Sensitive     * STAPHYLOCOCCUS HOMINIS  Blood Culture ID Panel (Reflexed)     Status: Abnormal   Collection Time: 07/09/21 12:15 PM  Result Value Ref Range Status   Enterococcus faecalis NOT DETECTED NOT DETECTED Final   Enterococcus Faecium NOT DETECTED NOT DETECTED Final   Listeria monocytogenes NOT DETECTED NOT DETECTED Final   Staphylococcus species DETECTED (A) NOT DETECTED Final    Comment: CRITICAL RESULT CALLED TO, READ BACK BY AND VERIFIED WITH: ALYCIA BEVERLY RN 0500 07/10/21 H[facility]    Staphylococcus aureus (BCID) NOT DETECTED NOT DETECTED  Final   Staphylococcus epidermidis NOT DETECTED NOT DETECTED Final   Staphylococcus lugdunensis NOT DETECTED NOT DETECTED Final   Streptococcus species NOT DETECTED NOT DETECTED Final   Streptococcus agalactiae NOT DETECTED NOT DETECTED Final   Streptococcus pneumoniae NOT DETECTED NOT DETECTED Final   Streptococcus pyogenes NOT DETECTED NOT DETECTED Final   A.calcoaceticus-baumannii NOT DETECTED NOT DETECTED Final   Bacteroides  fragilis NOT DETECTED NOT DETECTED Final   Enterobacterales NOT DETECTED NOT DETECTED Final   Enterobacter cloacae complex NOT DETECTED NOT DETECTED Final   Escherichia coli NOT DETECTED NOT DETECTED Final   Klebsiella aerogenes NOT DETECTED NOT DETECTED Final   Klebsiella oxytoca NOT DETECTED NOT DETECTED Final   Klebsiella pneumoniae NOT DETECTED NOT DETECTED Final   Proteus species NOT DETECTED NOT DETECTED Final   Salmonella species NOT DETECTED NOT DETECTED Final   Serratia marcescens NOT DETECTED NOT DETECTED Final   Haemophilus influenzae NOT DETECTED NOT DETECTED Final   Neisseria meningitidis NOT DETECTED NOT DETECTED Final   Pseudomonas aeruginosa NOT DETECTED NOT DETECTED Final   Stenotrophomonas maltophilia NOT DETECTED NOT DETECTED Final   Candida albicans NOT DETECTED NOT DETECTED Final   Candida auris NOT DETECTED NOT DETECTED Final   Candida glabrata NOT DETECTED NOT DETECTED Final   Candida krusei NOT DETECTED NOT DETECTED Final   Candida parapsilosis NOT DETECTED NOT DETECTED Final   Candida tropicalis NOT DETECTED NOT DETECTED Final   Cryptococcus neoformans/gattii NOT DETECTED NOT DETECTED Final    Comment: Performed at [facility], [address], [place], [place] [address]  Urine Culture     Status: Abnormal   Collection Time: 07/09/21  1:31 PM   Specimen: Urine, Clean Catch  Result Value Ref Range Status   Specimen Description   Final    URINE, CLEAN CATCH Performed at [facility], [address],  [place], [place] [address]    Special Requests   Final    Normal Performed at [facility], [address] [place], [place] [address]    Culture MULTIPLE SPECIES PRESENT, SUGGEST RECOLLECTION (A)  Final   Report Status 07/10/2021 FINAL  Final  CULTURE, BLOOD (ROUTINE X 2) w Reflex to ID Panel     Status: None   Collection Time: 07/10/21 10:05 AM   Specimen: BLOOD  Result Value Ref Range Status   Specimen Description BLOOD RIGHT UPPER ARM  Final   Special Requests   Final    BOTTLES DRAWN AEROBIC AND ANAEROBIC Blood Culture adequate volume   Culture   Final    NO GROWTH 5 DAYS Performed at [facility], [address] [place], [place] [address]    Report Status 07/15/2021 FINAL  Final  CULTURE, BLOOD (ROUTINE X 2) w Reflex to ID Panel     Status: None   Collection Time: 07/10/21 10:05 AM   Specimen: BLOOD  Result Value Ref Range Status   Specimen Description BLOOD RIGHT WRIST  Final   Special Requests   Final    BOTTLES DRAWN AEROBIC AND ANAEROBIC Blood Culture adequate volume   Culture   Final    NO GROWTH 5 DAYS Performed at [facility], [address], [place],  [address]    Report Status 07/15/2021 FINAL  Final    RADIOLOGY:  No results found.   CODE STATUS:     Code Status Orders  (From admission, onward)           Start     Ordered   07/09/21 1452  Do not attempt resuscitation (DNR)  Continuous       Question Answer Comment  In the event of cardiac or respiratory ARREST Do not call a "code blue"   In the event of cardiac or respiratory ARREST Do not perform Intubation, CPR, defibrillation or ACLS   In the event of cardiac or respiratory ARREST Use medication by  any route, position, wound care, and other measures to relive pain and suffering. May use oxygen, suction and manual treatment of airway obstruction as needed for comfort.      07/09/21 1451           Code Status History     Date Active Date Inactive Code  Status Order ID Comments User Context   07/09/2021 1316 07/09/2021 1451 Full Code [id number]  Tacey Ruiz, MD ED   02/18/2017 1609 02/20/2017 2035 DNR [id number]  Hillary Bow, MD ED        TOTAL TIME TAKING CARE OF THIS PATIENT: 35 minutes.    [name] M.D  [facility]    CC: Primary care physician; [name], MD

## 2021-07-23 DEATH — deceased

## 2021-11-19 ENCOUNTER — Ambulatory Visit: Payer: Medicare Other | Admitting: Oncology

## 2021-11-19 ENCOUNTER — Other Ambulatory Visit: Payer: Medicare Other

## 2023-01-24 IMAGING — US US EXTREM LOW VENOUS*L*
1 series · 13 of 24 positions shown · non-contrast
Comparison: None.

CLINICAL DATA: Left lower extremity edema.  Evaluate for DVT.



[Series 1: us extrem low venous*left* · 0.07mm/px · 13 of 36 slices shown]
[im 1/36]
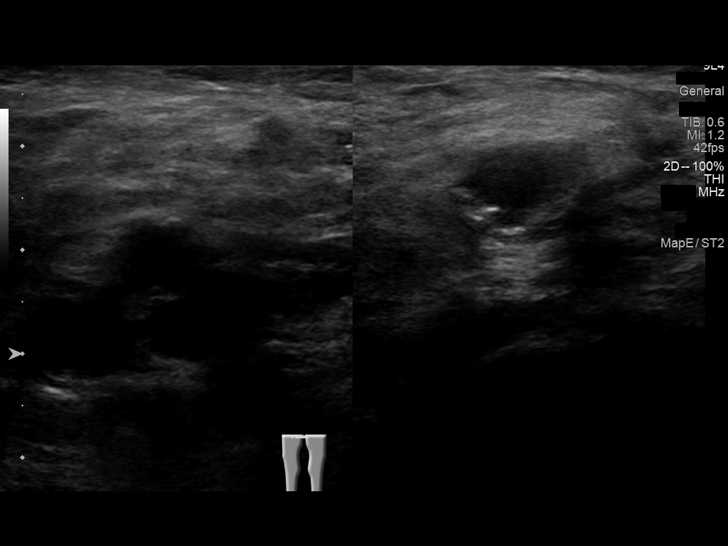
[im 4/36]
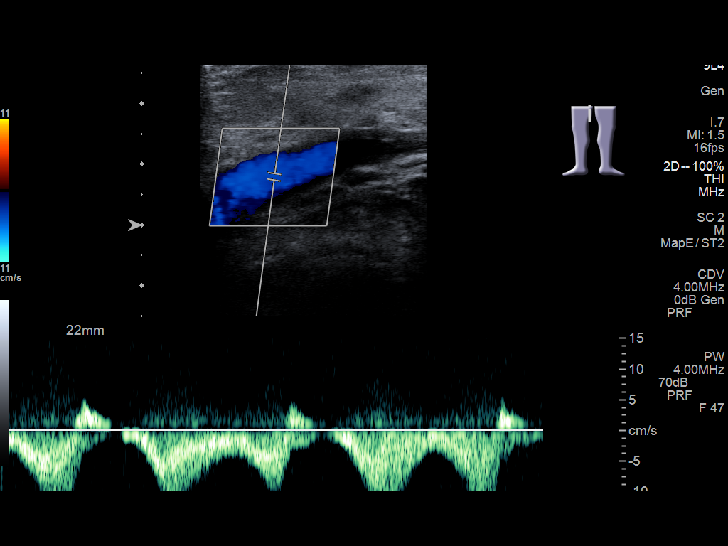
[im 7/36]
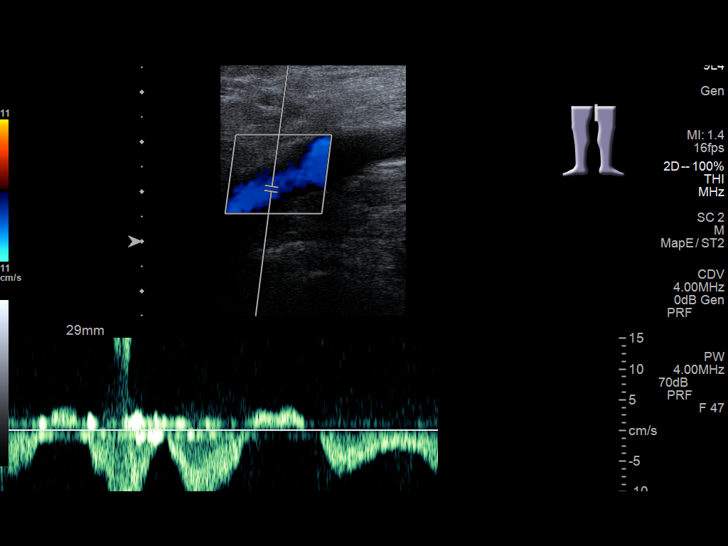
[im 10/36]
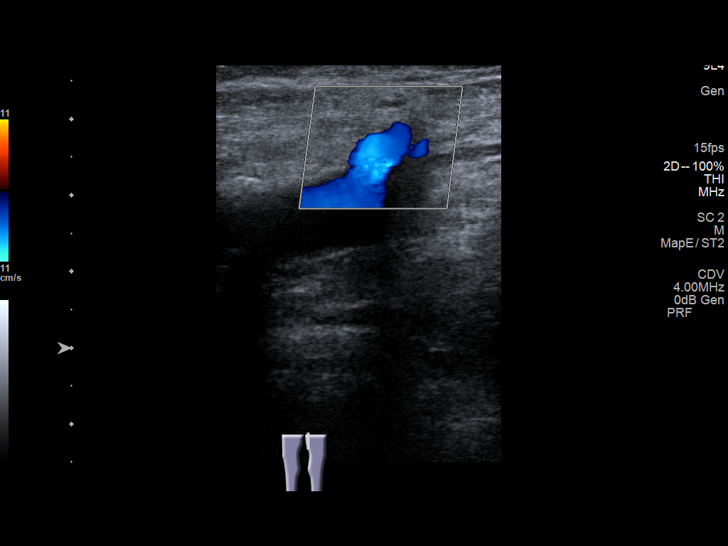
[im 13/36]
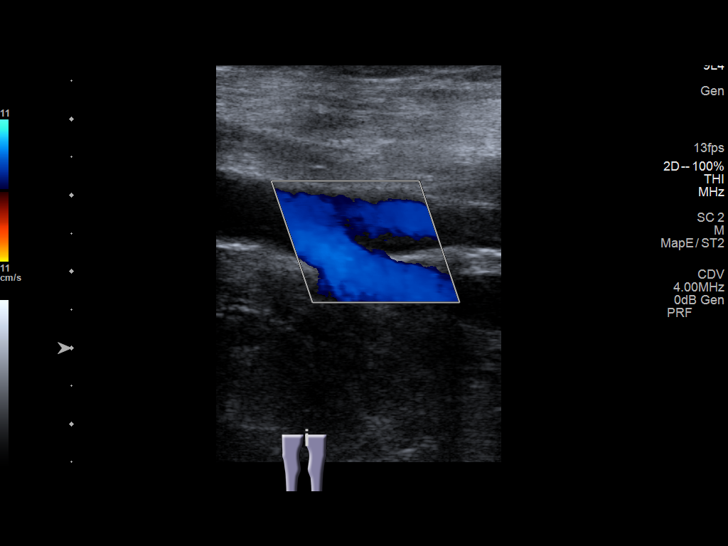
[im 16/36]
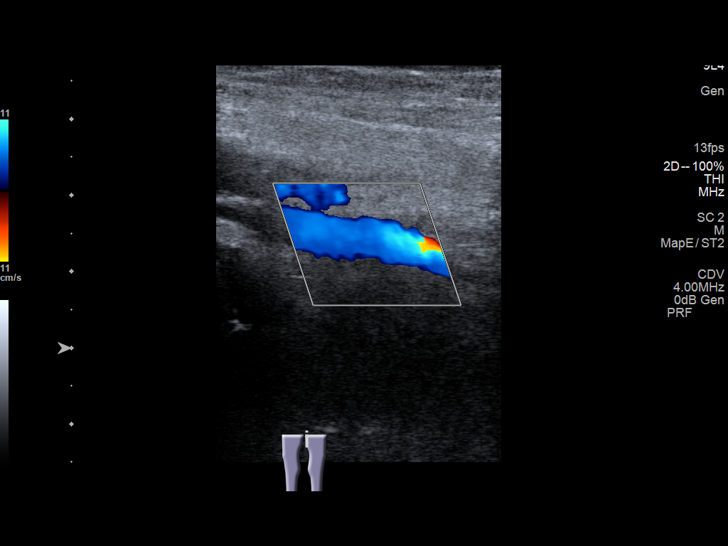
[im 19/36]
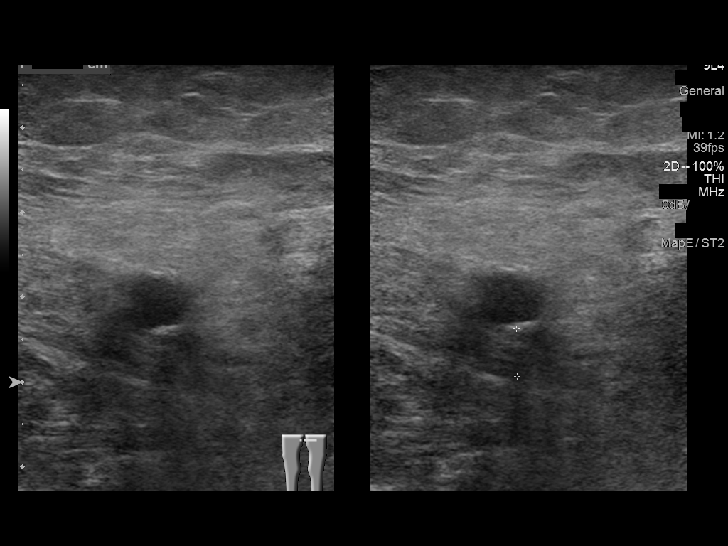
[im 20/36]
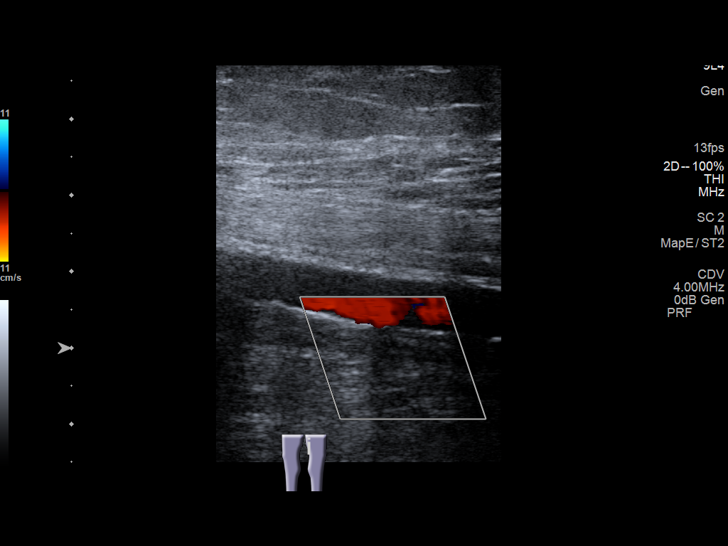
[im 23/36]
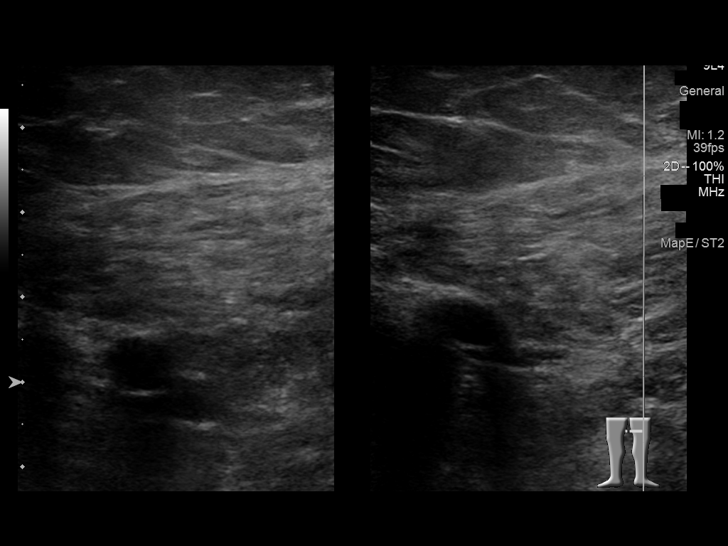
[im 26/36]
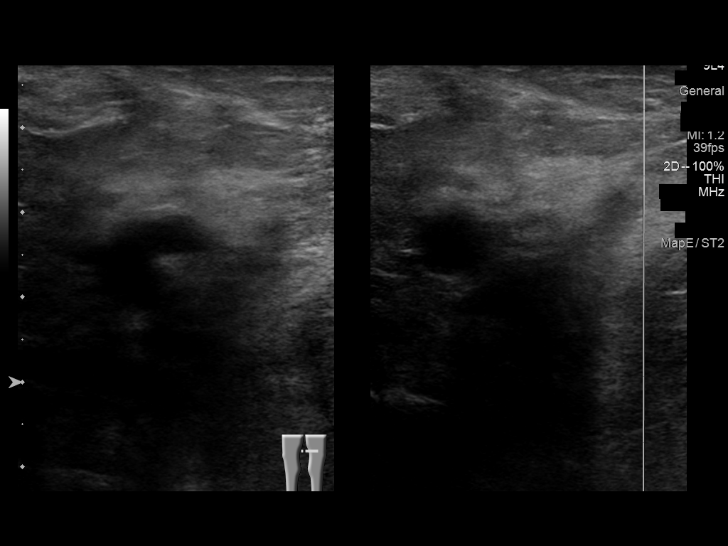
[im 29/36]
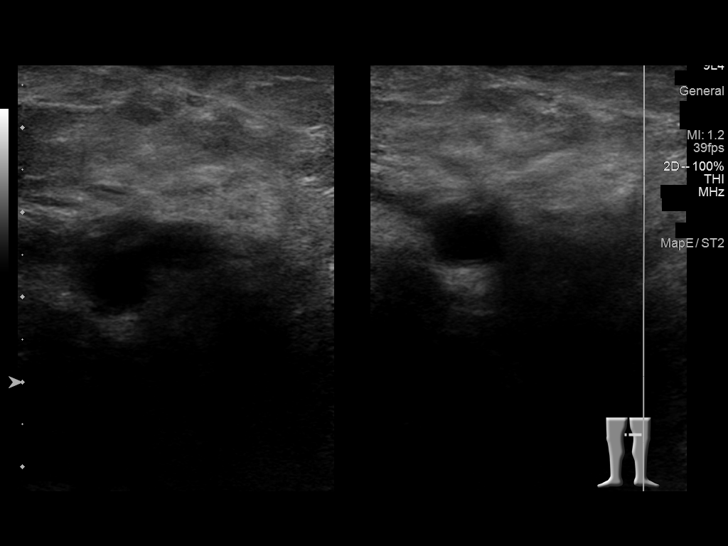
[im 32/36]
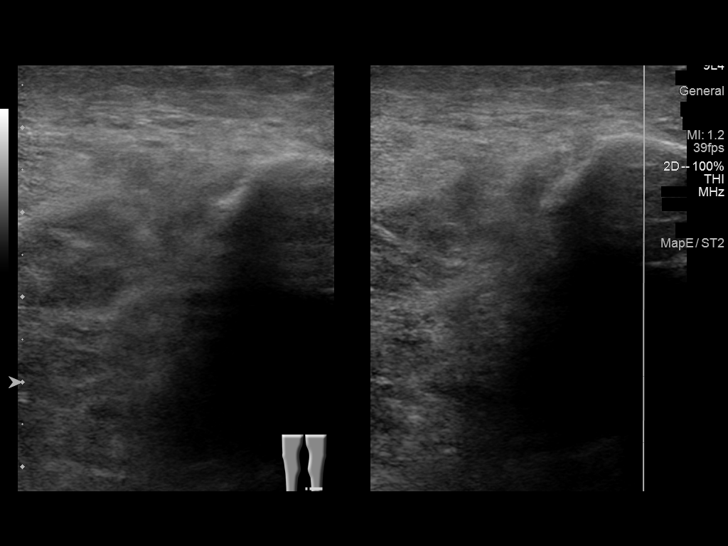
[im 36/36]
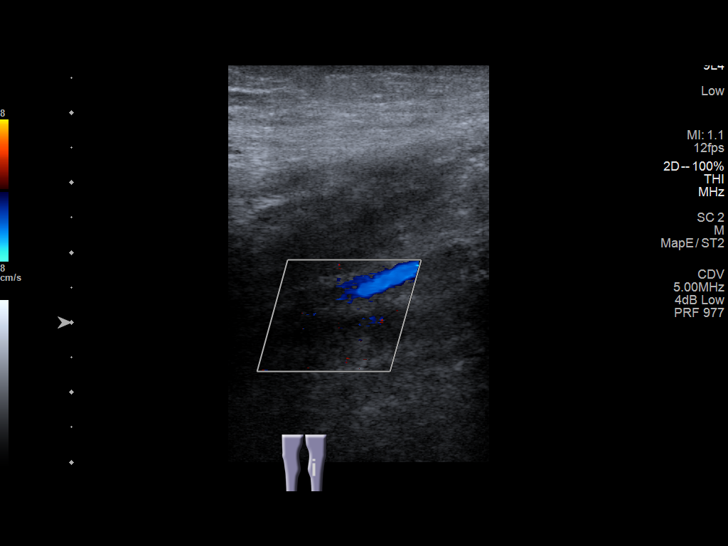

[13 of 24 positions shown; findings below may reference images not displayed]

FINDINGS: Contralateral Common Femoral Vein: Respiratory phasicity is normal
and symmetric with the symptomatic side. No evidence of thrombus.
Normal compressibility.

Common Femoral Vein: No evidence of thrombus. Normal
compressibility, respiratory phasicity and response to augmentation.

Saphenofemoral Junction: No evidence of thrombus. Normal
compressibility and flow on color Doppler imaging.

Profunda Femoral Vein: No evidence of thrombus. Normal
compressibility and flow on color Doppler imaging.

Femoral Vein: While the proximal (image 17) and distal (image 26)
aspects of the left femoral vein appear widely patent, there is
hypoechoic occlusive thrombus involving the mid aspect the left
femoral vein (images 20 and 21).

Popliteal Vein: No definite evidence of thrombus. Normal
compressibility, respiratory phasicity and response to augmentation.

Calf Veins: No evidence of thrombus. Normal compressibility and flow
on color Doppler imaging.

Superficial Great Saphenous Vein: No evidence of thrombus. Normal
compressibility.

Other Findings:  None.
IMPRESSION: The examination is positive for short-segment occlusive DVT
involving the mid aspect of the left femoral vein.
